# Patient Record
Sex: Female | Born: 1955
Health system: Southern US, Community
[De-identification: ages and names within clinical notes are randomized; demographics above are authoritative.]

## PROBLEM LIST (undated history)

## (undated) DIAGNOSIS — F329 Major depressive disorder, single episode, unspecified: Secondary | ICD-10-CM

## (undated) DIAGNOSIS — E785 Hyperlipidemia, unspecified: Secondary | ICD-10-CM

## (undated) DIAGNOSIS — Q5001 Congenital absence of ovary, unilateral: Secondary | ICD-10-CM

## (undated) DIAGNOSIS — F411 Generalized anxiety disorder: Secondary | ICD-10-CM

## (undated) DIAGNOSIS — N3010 Interstitial cystitis (chronic) without hematuria: Secondary | ICD-10-CM

## (undated) DIAGNOSIS — N200 Calculus of kidney: Secondary | ICD-10-CM

## (undated) DIAGNOSIS — Z9289 Personal history of other medical treatment: Secondary | ICD-10-CM

## (undated) DIAGNOSIS — K3184 Gastroparesis: Secondary | ICD-10-CM

## (undated) DIAGNOSIS — Z86010 Personal history of colonic polyps: Secondary | ICD-10-CM

## (undated) DIAGNOSIS — F32A Depression, unspecified: Secondary | ICD-10-CM

## (undated) DIAGNOSIS — H9319 Tinnitus, unspecified ear: Secondary | ICD-10-CM

## (undated) DIAGNOSIS — Z8719 Personal history of other diseases of the digestive system: Secondary | ICD-10-CM

## (undated) DIAGNOSIS — G8929 Other chronic pain: Secondary | ICD-10-CM

## (undated) DIAGNOSIS — R3989 Other symptoms and signs involving the genitourinary system: Secondary | ICD-10-CM

## (undated) DIAGNOSIS — Z8619 Personal history of other infectious and parasitic diseases: Secondary | ICD-10-CM

## (undated) DIAGNOSIS — Z860101 Personal history of adenomatous and serrated colon polyps: Secondary | ICD-10-CM

## (undated) DIAGNOSIS — M549 Dorsalgia, unspecified: Secondary | ICD-10-CM

## (undated) DIAGNOSIS — K219 Gastro-esophageal reflux disease without esophagitis: Secondary | ICD-10-CM

## (undated) HISTORY — PX: OTHER SURGICAL HISTORY: SHX169

## (undated) HISTORY — DX: Personal history of other medical treatment: Z92.89

## (undated) HISTORY — PX: CARDIOVASCULAR STRESS TEST: SHX262

## (undated) HISTORY — DX: Gastroparesis: K31.84

---

## 1990-05-11 DIAGNOSIS — N3010 Interstitial cystitis (chronic) without hematuria: Secondary | ICD-10-CM

## 1990-05-11 HISTORY — DX: Interstitial cystitis (chronic) without hematuria: N30.10

## 1996-05-11 HISTORY — PX: ABDOMINAL HYSTERECTOMY: SHX81

## 1997-09-11 ENCOUNTER — Other Ambulatory Visit: Admission: RE | Admit: 1997-09-11 | Discharge: 1997-09-11 | Payer: Self-pay | Admitting: Obstetrics and Gynecology

## 1997-10-25 ENCOUNTER — Ambulatory Visit (HOSPITAL_COMMUNITY): Admission: RE | Admit: 1997-10-25 | Discharge: 1997-10-25 | Payer: Self-pay | Admitting: Gastroenterology

## 1998-09-13 ENCOUNTER — Ambulatory Visit (HOSPITAL_COMMUNITY): Admission: RE | Admit: 1998-09-13 | Discharge: 1998-09-13 | Payer: Self-pay | Admitting: Gastroenterology

## 1999-11-12 ENCOUNTER — Emergency Department (HOSPITAL_COMMUNITY): Admission: EM | Admit: 1999-11-12 | Discharge: 1999-11-12 | Payer: Self-pay | Admitting: Emergency Medicine

## 1999-11-12 ENCOUNTER — Encounter: Payer: Self-pay | Admitting: Emergency Medicine

## 2000-07-15 ENCOUNTER — Other Ambulatory Visit: Admission: RE | Admit: 2000-07-15 | Discharge: 2000-07-15 | Payer: Self-pay | Admitting: Obstetrics and Gynecology

## 2001-01-03 ENCOUNTER — Ambulatory Visit (HOSPITAL_COMMUNITY): Admission: RE | Admit: 2001-01-03 | Discharge: 2001-01-03 | Payer: Self-pay | Admitting: Gastroenterology

## 2001-02-04 HISTORY — PX: OTHER SURGICAL HISTORY: SHX169

## 2001-04-06 ENCOUNTER — Encounter: Payer: Self-pay | Admitting: Neurosurgery

## 2001-04-06 ENCOUNTER — Ambulatory Visit (HOSPITAL_COMMUNITY): Admission: RE | Admit: 2001-04-06 | Discharge: 2001-04-06 | Payer: Self-pay | Admitting: Neurosurgery

## 2001-06-02 ENCOUNTER — Observation Stay (HOSPITAL_COMMUNITY): Admission: RE | Admit: 2001-06-02 | Discharge: 2001-06-03 | Payer: Self-pay | Admitting: Plastic Surgery

## 2001-06-02 HISTORY — PX: ABDOMINOPLASTY: SUR9

## 2002-01-12 ENCOUNTER — Emergency Department (HOSPITAL_COMMUNITY): Admission: EM | Admit: 2002-01-12 | Discharge: 2002-01-12 | Payer: Self-pay | Admitting: Emergency Medicine

## 2003-03-31 ENCOUNTER — Emergency Department (HOSPITAL_COMMUNITY): Admission: EM | Admit: 2003-03-31 | Discharge: 2003-03-31 | Payer: Self-pay | Admitting: Emergency Medicine

## 2004-03-27 ENCOUNTER — Other Ambulatory Visit: Admission: RE | Admit: 2004-03-27 | Discharge: 2004-03-27 | Payer: Self-pay | Admitting: Obstetrics and Gynecology

## 2005-05-06 ENCOUNTER — Emergency Department (HOSPITAL_COMMUNITY): Admission: EM | Admit: 2005-05-06 | Discharge: 2005-05-06 | Payer: Self-pay | Admitting: Emergency Medicine

## 2005-09-07 ENCOUNTER — Ambulatory Visit: Payer: Self-pay | Admitting: Cardiology

## 2006-05-11 HISTORY — PX: BREAST REDUCTION SURGERY: SHX8

## 2006-05-18 ENCOUNTER — Ambulatory Visit: Payer: Self-pay

## 2007-01-31 ENCOUNTER — Encounter: Admission: RE | Admit: 2007-01-31 | Discharge: 2007-01-31 | Payer: Self-pay | Admitting: Orthopedic Surgery

## 2007-02-28 ENCOUNTER — Encounter: Admission: RE | Admit: 2007-02-28 | Discharge: 2007-02-28 | Payer: Self-pay | Admitting: Orthopedic Surgery

## 2007-06-28 ENCOUNTER — Ambulatory Visit: Payer: Self-pay | Admitting: Cardiology

## 2007-07-05 ENCOUNTER — Ambulatory Visit: Payer: Self-pay

## 2007-07-05 ENCOUNTER — Encounter: Payer: Self-pay | Admitting: Internal Medicine

## 2008-03-19 ENCOUNTER — Encounter: Payer: Self-pay | Admitting: Internal Medicine

## 2008-04-13 ENCOUNTER — Emergency Department (HOSPITAL_COMMUNITY): Admission: EM | Admit: 2008-04-13 | Discharge: 2008-04-13 | Payer: Self-pay | Admitting: Emergency Medicine

## 2008-05-11 DIAGNOSIS — K3184 Gastroparesis: Secondary | ICD-10-CM

## 2008-05-11 HISTORY — DX: Gastroparesis: K31.84

## 2008-06-01 ENCOUNTER — Ambulatory Visit (HOSPITAL_COMMUNITY): Admission: RE | Admit: 2008-06-01 | Discharge: 2008-06-01 | Payer: Self-pay | Admitting: Gastroenterology

## 2008-06-26 ENCOUNTER — Encounter: Admission: RE | Admit: 2008-06-26 | Discharge: 2008-06-26 | Payer: Self-pay | Admitting: Gastroenterology

## 2008-07-03 ENCOUNTER — Ambulatory Visit (HOSPITAL_COMMUNITY): Admission: RE | Admit: 2008-07-03 | Discharge: 2008-07-03 | Payer: Self-pay | Admitting: Gastroenterology

## 2008-08-02 ENCOUNTER — Encounter: Admission: RE | Admit: 2008-08-02 | Discharge: 2008-08-02 | Payer: Self-pay | Admitting: General Surgery

## 2008-09-13 ENCOUNTER — Emergency Department (HOSPITAL_COMMUNITY): Admission: EM | Admit: 2008-09-13 | Discharge: 2008-09-14 | Payer: Self-pay | Admitting: Emergency Medicine

## 2009-06-03 ENCOUNTER — Telehealth: Payer: Self-pay | Admitting: Cardiology

## 2009-06-09 ENCOUNTER — Emergency Department (HOSPITAL_COMMUNITY): Admission: EM | Admit: 2009-06-09 | Discharge: 2009-06-09 | Payer: Self-pay | Admitting: Emergency Medicine

## 2009-06-21 DIAGNOSIS — K219 Gastro-esophageal reflux disease without esophagitis: Secondary | ICD-10-CM | POA: Insufficient documentation

## 2009-06-21 DIAGNOSIS — K3184 Gastroparesis: Secondary | ICD-10-CM | POA: Insufficient documentation

## 2009-06-24 ENCOUNTER — Ambulatory Visit: Payer: Self-pay | Admitting: Internal Medicine

## 2009-06-24 DIAGNOSIS — E785 Hyperlipidemia, unspecified: Secondary | ICD-10-CM | POA: Insufficient documentation

## 2009-06-27 ENCOUNTER — Telehealth: Payer: Self-pay | Admitting: Internal Medicine

## 2009-07-02 ENCOUNTER — Ambulatory Visit: Payer: Self-pay | Admitting: Internal Medicine

## 2009-07-03 ENCOUNTER — Encounter: Payer: Self-pay | Admitting: Internal Medicine

## 2009-07-03 LAB — CONVERTED CEMR LAB
ALT: 22 units/L (ref 0–35)
Albumin: 3.6 g/dL (ref 3.5–5.2)
Cholesterol: 302 mg/dL — ABNORMAL HIGH (ref 0–200)
Direct LDL: 214.6 mg/dL
HDL: 52.6 mg/dL (ref >39.00)
Total Bilirubin: 0.6 mg/dL (ref 0.3–1.2)

## 2009-08-01 ENCOUNTER — Telehealth: Payer: Self-pay | Admitting: Internal Medicine

## 2009-09-04 ENCOUNTER — Ambulatory Visit: Payer: Self-pay | Admitting: Internal Medicine

## 2009-09-05 ENCOUNTER — Telehealth: Payer: Self-pay | Admitting: Internal Medicine

## 2009-09-05 LAB — CONVERTED CEMR LAB
AST: 19 units/L (ref 0–37)
Cholesterol: 182 mg/dL (ref 0–200)
LDL Cholesterol: 99 mg/dL (ref 0–99)
Total CHOL/HDL Ratio: 4
Total CK: 45 units/L (ref 7–177)
Triglycerides: 193 mg/dL — ABNORMAL HIGH (ref 0.0–149.0)

## 2009-10-10 ENCOUNTER — Emergency Department (HOSPITAL_COMMUNITY): Admission: EM | Admit: 2009-10-10 | Discharge: 2009-10-10 | Payer: Self-pay | Admitting: Emergency Medicine

## 2009-11-20 ENCOUNTER — Encounter: Admission: RE | Admit: 2009-11-20 | Discharge: 2009-11-20 | Payer: Self-pay | Admitting: Otolaryngology

## 2010-03-31 ENCOUNTER — Ambulatory Visit: Payer: Self-pay | Admitting: Internal Medicine

## 2010-05-31 ENCOUNTER — Encounter: Payer: Self-pay | Admitting: Orthopedic Surgery

## 2010-06-01 ENCOUNTER — Encounter: Payer: Self-pay | Admitting: Gastroenterology

## 2010-06-01 ENCOUNTER — Encounter: Payer: Self-pay | Admitting: Orthopedic Surgery

## 2010-06-10 NOTE — Miscellaneous (Signed)
  Clinical Lists Changes  Medications: Added new medication of CRESTOR 5 MG TABS (ROSUVASTATIN CALCIUM) 1 pill every day before bedtime - Signed Rx of CRESTOR 5 MG TABS (ROSUVASTATIN CALCIUM) 1 pill every day before bedtime;  #30 x 6;  Signed;  Entered by: Layne Benton, RN, BSN;  Authorized by: Sherrill Raring, MD, [facility];  Method used: Electronically to [facility]*, [address] [place], [place]  [id number], Ph: [phone], Fax: [phone]    Prescriptions: CRESTOR 5 MG TABS (ROSUVASTATIN CALCIUM) 1 pill every day before bedtime  #30 x 6   Entered by:   Layne Benton, RN, BSN   Authorized by:   Sherrill Raring, MD, [facility]   Signed by:   Layne Benton, RN, BSN on 07/03/2009   Method used:   Electronically to        [facility]* (retail)       [address]       [place], [place]  [id number]       Ph: [phone]       Fax: [phone]   RxID:   [id number]

## 2010-06-10 NOTE — Progress Notes (Signed)
Summary: Pt is wanting a women Dr. want to talk about this matter.   Phone Note Call from Patient Call back at Home Phone [phone] Call back at Work Phone [phone]   Caller: Patient Summary of Call: Pt want to talk to nurse about switching to  Dr. Rocco Pauls pt is wanting a women Dr. Initial call taken by: Judie Grieve,  June 03, 2009 1:55 PM  Follow-up for Phone Call        Left message to call back Meredith Staggers, RN  June 04, 2009 3:13 PM   returning call, [phone], Migdalia Dk  June 04, 2009 3:50 PM    pt returning call [phone] sign. Follow-up by: Judie Grieve,  June 05, 2009 10:10 AM  Additional Follow-up for Phone Call Additional follow up Details #1::        spoke w/pt she has no problems or complaints regarding Dr Myrtis Ser, she just wants to have a female MD, will make sure ok w/MD's and we will sch her w/Dr Tenny Craw if ok Meredith Staggers, RN  June 05, 2009 11:12 AM   This is ok w/Dr Myrtis Ser, will send to [facility] to sch appt w/Dr Sandrea Hammond, RN  June 06, 2009 5:38 PM

## 2010-06-10 NOTE — Progress Notes (Signed)
Summary: returning call   Phone Note Call from Patient Call back at Work Phone [phone]   Caller: Patient Reason for Call: Talk to Nurse Summary of Call: returning call  Initial call taken by: Migdalia Dk,  September 05, 2009 9:46 AM  Follow-up for Phone Call        Called patient back with lab results and instructions. Follow-up by: Suzan Garibaldi RN

## 2010-06-10 NOTE — Assessment & Plan Note (Signed)
Summary: ec6/saw Dr Myrtis Ser pts/jss  Medications Added PROZAC 20 MG CAPS (FLUOXETINE HCL) 1  by mouth daily AMBIEN 10 MG TABS (ZOLPIDEM TARTRATE) 1 by mouth as needed * DOMPERIDONE 10MG  1 by mouth 4 times daily      Allergies Added: ! ASA ! NSAIDS  History of Present Illness: Olivia Barker is a 55 year old with a history of dyslipidemia and chest pain.  She was previously seen by Lovena Neighbours in clinic in 2009. At that time underwent an exercise cardiolite that was normal.  She has been on therapy in past for dyslipidemia.  Last was Crestor. Since seen, she was also seen at [facility])  She has not f/u with any cardiologis since. She remains very active.  Works out aerobically every day.  No problems She has been seen by Dr. Kinnie Scales and at [facility].  Found to have gastroparesis.  Has had marked improvement in symptoms with domperidone.  Chest pressue is gone. Watches what she eats.  Allergies (verified): 1)  ! Asa 2)  ! Nsaids  Past History:  Past Surgical History: Last updated: 06/21/2009  breast reduction, caesarean section, hysterectomy   Social History: Last updated: 06/21/2009  non-smoker, non-drinker   Past Medical History:  gastroparesis, GERD  Dyslipidemia  Family History: Family Hx of heart disease`on fathers side(dad died of MI as did his brothers) 2 brothers with DM, mom with DM  Review of Systems       All systems reviewed.  Negatvie to the above problem except as noted above.  Vital Signs:  Patient profile:   55 year old female Height:      63 inches Weight:      165 pounds BMI:     29.33 Pulse rate:   87 / minute Resp:     16 per minute BP sitting:   112 / 86  (left arm)  Vitals Entered By: Marrion Coy, CNA (June 24, 2009 10:27 AM)  Physical Exam  Additional Exam:  HEENT:  Normocephalic, atraumatic. EOMI, PERRLA.  Neck: JVP is normal. No thyromegaly. No bruits.  Lungs: clear to auscultation. No rales no wheezes.  Heart: Regular rate and  rhythm. Normal S1, S2. No S3.   No significant murmurs. PMI not displaced.  Abdomen:  Supple, nontender. Normal bowel sounds. No masses. No hepatomegaly.  Extremities:   Good distal pulses throughout. No lower extremity edema.  Musculoskeletal :moving all extremities.  Neuro:   alert and oriented x3.    EKG  Procedure date:  06/24/2009  Findings:      NSR.  87 bpm.  T wave inversion III, AVF (new in AVF)  Impression & Recommendations:  Problem # 1:  HYPERLIPIDEMIA-MIXED (ICD-272.4) I have copies of previous lipids and other markers.  I think we need current tipids to see where to go. I am not convinced signif CRP elev in past was due to CAD risks. I would encourage her to continue to stay active.  Problem # 2:  GASTROPARESIS (ICD-536.3) Followed by J. Medoff and at [facility].  Other Orders: EKG w/ Interpretation (93000)  Patient Instructions: 1)  Will get labs and contact.  Appended Document: ec6/saw Dr Myrtis Ser pts/jss Noted did not tolerate pravachol.  Felt foggy on Lipitor.  Appended Document: ec6/saw Dr Myrtis Ser pts/jss LM on cell phone to call back to schedule Fasting Lab work.

## 2010-06-10 NOTE — Progress Notes (Signed)
Summary: Questions about medications/**LM/nm   Phone Note Call from Patient Call back at Home Phone [phone]   Caller: Patient Summary of Call: Questions about medications Initial call taken by: Judie Grieve,  August 01, 2009 1:10 PM  Follow-up for Phone Call        [facility]. Ollen Gross, RN, BSN  August 01, 2009 2:30 PM    Additional Follow-up for Phone Call Additional follow up Details #1::        Called patient back again...she reported that she is feeling a little achy in the thighs maybe due to taking Crestor 5mg  every day. She will see if she can tolerate it longer. Advised her that if condition worsens that Dr.Shawnta Schlegel will either cut back on the dose or change to a different medication. She is scheduled for repeat fasting labs in April.  Additional Follow-up by: J REISS RN      Appended Document: Questions about medications/**LM/nm Make sure she is set up for CK as well.  Appended Document: Questions about medications/**LM/nm Added CK to lab order for April.

## 2010-06-10 NOTE — Progress Notes (Signed)
Summary: returning call   Phone Note Call from Patient Call back at [phone]   Reason for Call: Talk to Nurse Summary of Call: returning call Initial call taken by: Migdalia Dk,  June 27, 2009 10:22 AM  Follow-up for Phone Call        I spoke with the pt. She is scheduled for 07/02/09 for lipid/liver panel.  Follow-up by: Sherri Rad, RN, BSN,  June 27, 2009 10:30 AM

## 2010-06-13 ENCOUNTER — Ambulatory Visit: Payer: Self-pay | Admitting: Internal Medicine

## 2010-06-13 ENCOUNTER — Ambulatory Visit: Admit: 2010-06-13 | Payer: Self-pay | Admitting: Internal Medicine

## 2010-07-04 ENCOUNTER — Encounter: Payer: Self-pay | Admitting: Internal Medicine

## 2010-07-04 ENCOUNTER — Ambulatory Visit (INDEPENDENT_AMBULATORY_CARE_PROVIDER_SITE_OTHER): Payer: BC Managed Care – PPO | Admitting: Internal Medicine

## 2010-07-04 DIAGNOSIS — R0789 Other chest pain: Secondary | ICD-10-CM

## 2010-07-04 DIAGNOSIS — E7800 Pure hypercholesterolemia, unspecified: Secondary | ICD-10-CM

## 2010-07-04 DIAGNOSIS — E785 Hyperlipidemia, unspecified: Secondary | ICD-10-CM

## 2010-07-07 ENCOUNTER — Telehealth: Payer: Self-pay | Admitting: Internal Medicine

## 2010-07-08 LAB — CONVERTED CEMR LAB
BUN: 12 mg/dL (ref 6–23)
CO2: 25 meq/L (ref 19–32)
Calcium: 9.4 mg/dL (ref 8.4–10.5)
Hgb A1c MFr Bld: 5.7 % — ABNORMAL HIGH (ref <5.7)
Sodium: 139 meq/L (ref 135–145)

## 2010-07-16 ENCOUNTER — Telehealth: Payer: Self-pay | Admitting: Internal Medicine

## 2010-07-17 NOTE — Progress Notes (Addendum)
Summary: lab results   Phone Note Call from Patient   Caller: Patient [phone] Reason for Call: Talk to Nurse, Lab or Test Results Summary of Call: pt calling re lab results Initial call taken by: Glynda Jaeger,  July 07, 2010 2:08 PM  Follow-up for Phone Call        Called and left message on private cell phone. Bmet and Hgbaic within normal limits. Still awating Liopomed.   Layne Benton, RN, BSN  July 07, 2010 5:21 PM

## 2010-07-17 NOTE — Assessment & Plan Note (Signed)
Summary: per check out/SF;F1Y  Medications Added ZOFRAN 8 MG TABS (ONDANSETRON HCL) as needed LORAZEPAM 1 MG TABS (LORAZEPAM) as needed      Allergies Added:   Visit Type:  Follow-up  CC:  sob / sick - headaches.  History of Present Illness: Ms. Gilmore is a 55 year old with a history of dyslipidemia and chest pain.  She was previously seen by Lovena Neighbours in clinic in 2009. At that time underwent an exercise cardiolite that was normal.  She has been on therapy in past for dyslipidemia.  Last was Crestor. Since seen, she was also seen at [place] [facility])  She has not f/u with any cardiologis since. She remains very active.  Works out aerobically every day.  No problems She has been seen by Dr. Kinnie Scales and at [facility].  Found to have gastroparesis.  Has had marked improvement in symptoms with domperidone.  Chest pressue is gone. Watches what she eats. since I saw her 1 year ago she has done well.  Denies chest pains  Breathing is OK  No chest pain.  No problems swallowing.  Does have a URI now.  Seen by E. McKinnon.   Current Medications (verified): 1)  Prozac 20 Mg Caps (Fluoxetine Hcl) .Marland Kitchen.. 1  By Mouth Daily 2)  Ambien 10 Mg Tabs (Zolpidem Tartrate) .Marland Kitchen.. 1 By Mouth As Needed 3)  Domperidone 10mg  .... 1 By Mouth 4 Times Daily 4)  Crestor 5 Mg Tabs (Rosuvastatin Calcium) .Marland Kitchen.. 1 Pill Every Day Before Bedtime 5)  Zofran 8 Mg Tabs (Ondansetron Hcl) .... As Needed 6)  Lorazepam 1 Mg Tabs (Lorazepam) .... As Needed  Allergies (verified): 1)  ! Asa 2)  ! Nsaids  Past History:  Past medical, surgical, family and social histories (including risk factors) reviewed, and no changes noted (except as noted below).  Past Medical History: Reviewed history from 06/24/2009 and no changes required.  gastroparesis, GERD  Dyslipidemia  Past Surgical History: Reviewed history from 06/21/2009 and no changes required.  breast reduction, caesarean section, hysterectomy   Family History: Reviewed  history from 06/24/2009 and no changes required. Family Hx of heart disease`on fathers side(dad died of MI as did his brothers) 2 brothers with DM, mom with DM  Social History: Reviewed history from 06/21/2009 and no changes required.  non-smoker, non-drinker   Review of Systems       All systems reviewed.  neg to the above problme except as noted above.  Vital Signs:  Patient profile:   55 year old female Height:      63 inches Weight:      167.75 pounds BMI:     29.82 Pulse rate:   93 / minute BP sitting:   134 / 90  (left arm) Cuff size:   regular  Vitals Entered By: Caralee Ates CMA (July 04, 2010 12:28 PM)  Physical Exam  Additional Exam:  Patient is in NAD HEENT:  Normocephalic, atraumatic. EOMI, PERRLA.  Neck: JVP is normal. No thyromegaly. No bruits.  Lungs: clear to auscultation. No rales no wheezes.  Heart: Regular rate and rhythm. Normal S1, S2. No S3.   No significant murmurs. PMI not displaced.  Abdomen:  Supple, nontender. Normal bowel sounds. No masses. No hepatomegaly.  Extremities:   Good distal pulses throughout. No lower extremity edema.  Musculoskeletal :moving all extremities.  Neuro:   alert and oriented x3.    EKG  Procedure date:  07/04/2010  Findings:      NSR.  85 bpm.  Impression & Recommendations:  Problem # 1:  HYPERLIPIDEMIA-MIXED (ICD-272.4) Will check lipomed as well as Hgb A1c and glu today.  keep on same regimen. Her updated medication list for this problem includes:    Crestor 5 Mg Tabs (Rosuvastatin calcium) .Marland Kitchen... 1 pill every day before bedtime  Orders: EKG w/ Interpretation (93000) T-Basic Metabolic Panel ([address]454) T-Hgb A1C (in-house) (09811BJ) T- * Misc. Laboratory test [phone])  Problem # 2:  GASTROPARESIS (ICD-536.3) Keep on same regimen.  Doing well.  Patient Instructions: 1)  Your physician recommends that you return for lab work in: lab work today .Marland KitchenMarland KitchenMarland Kitchenwe will call you with results 2)  Your physician  wants you to follow-up in: 12 months  You will receive a reminder letter in the mail two months in advance. If you don't receive a letter, please call our office to schedule the follow-up appointment.

## 2010-07-22 NOTE — Progress Notes (Signed)
Summary: pt call to get lab results   Phone Note Call from Patient Call back at [phone]   Caller: Patient Reason for Call: Talk to Nurse, Talk to Doctor, Lab or Test Results Summary of Call: Pt calling to get lipomed results because she still has not heard anything yet Initial call taken by: Omer Jack,  July 16, 2010 9:38 AM  Follow-up for Phone Call        Spoke with Annice Pih, Dr. Charlott Rakes RN, & she will f/u on this. Whitney Maeola Sarah RN  July 16, 2010 10:54 AM  Follow-up by: Whitney Maeola Sarah RN,  July 16, 2010 10:54 AM     Appended Document: pt call to get lab results Called rep from [facility] and they will fax results.

## 2010-07-25 ENCOUNTER — Telehealth: Payer: Self-pay | Admitting: Internal Medicine

## 2010-07-27 LAB — COMPREHENSIVE METABOLIC PANEL
ALT: 22 U/L (ref 0–35)
AST: 17 U/L (ref 0–37)
Albumin: 3.6 g/dL (ref 3.5–5.2)
BUN: 7 mg/dL (ref 6–23)
CO2: 27 mEq/L (ref 19–32)
Calcium: 8.7 mg/dL (ref 8.4–10.5)
Creatinine, Ser: 0.72 mg/dL (ref 0.4–1.2)
GFR calc non Af Amer: >60 mL/min (ref >60)

## 2010-07-27 LAB — URINALYSIS, ROUTINE W REFLEX MICROSCOPIC
Ketones, ur: NEGATIVE mg/dL
Nitrite: NEGATIVE
Specific Gravity, Urine: 1.004 — ABNORMAL LOW (ref 1.005–1.030)
Urobilinogen, UA: 0.2 mg/dL (ref 0.0–1.0)

## 2010-07-27 LAB — CBC
HCT: 41.5 % (ref 36.0–46.0)
RDW: 12.4 % (ref 11.5–15.5)
WBC: 10.1 10*3/uL (ref 4.0–10.5)

## 2010-07-27 LAB — DIFFERENTIAL
Basophils Relative: 0 % (ref 0–1)
Eosinophils Absolute: 0.1 10*3/uL (ref 0.0–0.7)
Eosinophils Relative: 1 % (ref 0–5)
Lymphs Abs: 1.8 10*3/uL (ref 0.7–4.0)
Monocytes Absolute: 0.5 10*3/uL (ref 0.1–1.0)
Neutrophils Relative %: 76 % (ref 43–77)

## 2010-07-27 LAB — LIPASE, BLOOD: Lipase: 30 U/L (ref 11–59)

## 2010-07-27 LAB — URINE MICROSCOPIC-ADD ON

## 2010-07-28 LAB — DIFFERENTIAL
Basophils Absolute: 0 10*3/uL (ref 0.0–0.1)
Lymphs Abs: 2.1 10*3/uL (ref 0.7–4.0)
Monocytes Absolute: 0.6 10*3/uL (ref 0.1–1.0)

## 2010-07-28 LAB — COMPREHENSIVE METABOLIC PANEL
ALT: 40 U/L — ABNORMAL HIGH (ref 0–35)
AST: 29 U/L (ref 0–37)
Alkaline Phosphatase: 72 U/L (ref 39–117)
BUN: 15 mg/dL (ref 6–23)
Calcium: 9.6 mg/dL (ref 8.4–10.5)
GFR calc Af Amer: >60 mL/min (ref >60)
GFR calc non Af Amer: >60 mL/min (ref >60)
Glucose, Bld: 131 mg/dL — ABNORMAL HIGH (ref 70–99)
Sodium: 138 mEq/L (ref 135–145)

## 2010-07-28 LAB — CBC
HCT: 47.6 % — ABNORMAL HIGH (ref 36.0–46.0)
MCHC: 33.4 g/dL (ref 30.0–36.0)
MCV: 92.3 fL (ref 78.0–100.0)
Platelets: 287 10*3/uL (ref 150–400)
RDW: 12.9 % (ref 11.5–15.5)
WBC: 16.5 10*3/uL — ABNORMAL HIGH (ref 4.0–10.5)

## 2010-07-29 NOTE — Progress Notes (Signed)
Summary: pt wants cholesterol results  Medications Added NIASPAN 500 MG CR-TABS (NIACIN (ANTIHYPERLIPIDEMIC)) 1 tab every day       Phone Note Call from Patient   Caller: Patient [phone]  Reason for Call: Talk to Nurse Summary of Call: pt wanting chlolesterol results Initial call taken by: Glynda Jaeger,  July 25, 2010 1:27 PM  Follow-up for Phone Call        Called patient with Lipomed results. She will start Niaspan 500mg  every day and have repeat labs on 5/30. She will pick up coupon and script. Copy of lipomed left for her to pick up also. Layne Benton, RN, BSN  July 25, 2010 2:29 PM     New/Updated Medications: NIASPAN 500 MG CR-TABS (NIACIN (ANTIHYPERLIPIDEMIC)) 1 tab every day Prescriptions: NIASPAN 500 MG CR-TABS (NIACIN (ANTIHYPERLIPIDEMIC)) 1 tab every day  #30 x 11   Entered by:   Layne Benton, RN, BSN   Authorized by:   Sherrill Raring, MD, [facility]   Signed by:   Layne Benton, RN, BSN on 07/25/2010   Method used:   Print then Give to Patient   RxID:   [id number]

## 2010-08-12 ENCOUNTER — Telehealth: Payer: Self-pay | Admitting: Internal Medicine

## 2010-08-12 NOTE — Telephone Encounter (Signed)
Called patient back-c/o flushing and indigestion with Niacin.  Wants to know if there is something else she can take.

## 2010-08-12 NOTE — Telephone Encounter (Signed)
Pt has question re meds. °

## 2010-08-13 NOTE — Telephone Encounter (Signed)
Called patient and advised her to stay off Niaspan since she had a bad reaction to it. Advised her will discuss with Dr.Ross on 4/5 if she wants her to take any other medication in addition to the Crestor that she is currently taking.    Layne Benton RN.

## 2010-08-13 NOTE — Telephone Encounter (Signed)
Pt calling stating she had bad reaction to niacin(flushing,nausea,skin bright red) and called [place] 2days ago to talk someone about it and no one has called her back--pt stated she d/ced the med and took benadryl, and it has subsided but she's very upset no one has gotten back to her and she wants to know what to do next--advised i would have dr Tenny Craw or [place] call her today or tomorrow--nt

## 2010-08-19 LAB — CBC
MCHC: 34.5 g/dL (ref 30.0–36.0)
RBC: 4.36 MIL/uL (ref 3.87–5.11)
RDW: 13.2 % (ref 11.5–15.5)

## 2010-08-19 LAB — DIFFERENTIAL
Basophils Absolute: 0 10*3/uL (ref 0.0–0.1)
Basophils Relative: 0 % (ref 0–1)
Monocytes Relative: 3 % (ref 3–12)
Neutro Abs: 9.9 10*3/uL — ABNORMAL HIGH (ref 1.7–7.7)
Neutrophils Relative %: 84 % — ABNORMAL HIGH (ref 43–77)

## 2010-08-19 LAB — COMPREHENSIVE METABOLIC PANEL
AST: 24 U/L (ref 0–37)
CO2: 27 mEq/L (ref 19–32)
Chloride: 105 mEq/L (ref 96–112)
Creatinine, Ser: 0.71 mg/dL (ref 0.4–1.2)
GFR calc Af Amer: >60 mL/min (ref >60)
GFR calc non Af Amer: >60 mL/min (ref >60)
Glucose, Bld: 106 mg/dL — ABNORMAL HIGH (ref 70–99)
Total Bilirubin: 1 mg/dL (ref 0.3–1.2)

## 2010-08-25 ENCOUNTER — Telehealth: Payer: Self-pay | Admitting: Internal Medicine

## 2010-08-25 NOTE — Telephone Encounter (Signed)
Pt calling to speak to dr Tenny Craw concerning her reaction to niacin--would like to know if possibly could cut niacin tab inhalf and resume taking or should she try something else--she would like dr Tenny Craw to call her on her cell phone today--[phone]--nt

## 2010-08-25 NOTE — Telephone Encounter (Signed)
rtn call to [facility] re results-pls call

## 2010-08-26 NOTE — Telephone Encounter (Signed)
Patient called.  Can't take ASA  (allergic).  Will try Niaspan with snack. Will review with pharmacy any other options.

## 2010-08-27 NOTE — Telephone Encounter (Signed)
Reviewed pt's information.  On statin for primary prevention.  LDL-P is elevated.  If pt cannot tolerate Niacin, can increase statin to help lower LDL-P, just usually not as effective as niacin.  Would ensure pt is using extended release niacin to help decrease flushing.

## 2010-08-29 NOTE — Telephone Encounter (Signed)
Called patient again. She did not re  try the Niaspan  because she has been out of town. She will try medication with a snack and call us next week to let us know how she is feeling.

## 2010-09-03 ENCOUNTER — Telehealth: Payer: Self-pay | Admitting: Internal Medicine

## 2010-09-03 DIAGNOSIS — E782 Mixed hyperlipidemia: Secondary | ICD-10-CM

## 2010-09-03 MED ORDER — ROSUVASTATIN CALCIUM 10 MG PO TABS: 10.0000 mg | ORAL_TABLET | Freq: Every day | ORAL | Status: DC

## 2010-09-03 NOTE — Telephone Encounter (Signed)
Pt calling back re meds being change.

## 2010-09-03 NOTE — Telephone Encounter (Addendum)
Called patient back. She did not tolerate Niaspan even with taking with a snack so she will stop medication and increase Crestor to 10mg  per day per Dr.Ross. She will have a repeat Lipomed and ast on June 20th at the [place] office. She will call back is she has any other problems.

## 2010-09-23 NOTE — Assessment & Plan Note (Signed)
[facility]                            CARDIOLOGY OFFICE NOTE   NAME:Fulop, PEREL HAUSCHILD                        MRN:          [id number]  DATE:06/28/2007                            DOB:          May 25, 1955    Ms. Riedinger is doing well overall.  She has had some chest discomfort in  the past few weeks.  She has had an area of discomfort at the upper  sternum that seemed to be more noticeable at night.  This did not bother  her with exercise.  There was no shortness of breath.  There was no  nausea, vomiting or diaphoresis.  She continued full activities.  There  was no pain to palpation.  This improved and it is now gone.   The patient has a family history of coronary disease.  She has  hyperlipidemia and this is being treated.   PAST MEDICAL HISTORY:   ALLERGIES:  THERE IS A HISTORY TO INDOCIN AND IVP DYE.  WITH DISCUSSION  TODAY, SHE TELLS ME THAT SHE HAD SWELLING IN THE PAST FROM NONSTEROIDAL  ANTI-INFLAMMATORY MED.  SHE SPOKE WITH DR. Stevphen Rochester OF ALLERGY AND  WAS TOLD NOT TO USE AND STATES AND IN ADDITION NOT TO USE ASPIRIN.  I DO  NOT KNOW THAT WE HAVE A DOCUMENTED TRUE ASPIRIN ALLERGY AND THIS WILL  HAVE TO BE KEPT IN MIND OVER TIME.   MEDICATIONS:  1. Prozac 20.  2. Crestor 5.   OTHER MEDICAL PROBLEMS:  See the list below.   REVIEW OF SYSTEMS:  She is not having any headaches or eye problems.  She has no GI or GU symptoms.  She is going about full activities.  Her  review of systems otherwise is negative.   PHYSICAL EXAM:  Weight is 155 pounds.  Blood pressure is 131/94. Her  other blood pressures with Dr. Chilton Si have been in the normal range.  She  needs blood pressure followup.  Rate is 89.  The patient is oriented to  person, time and place.  Affect is normal.  HEENT:  Reveals no xanthelasma.  She has normal extraocular motion.  There are no carotid bruits.  There is no jugular venous distention.  Lungs are clear.  Respiratory  effort is not labored.  Cardiac exam reveals S1-S2.  There are no clicks or significant murmurs.  The abdomen is soft.  She has normal bowel sounds.  She has no peripheral edema.  There are no musculoskeletal deformities.   EKG is normal.   PROBLEMS INCLUDE:  1. Strong family history of coronary disease.  2. Possible episode of supraventricular tachycardia in the past that      resolved.  3. History in the past of an elevated CRP.  She she is being treated      with Crestor for overall primary prevention.  4.  Some chest      heaviness in the past and some chest heaviness recently.  I am not      convinced this was cardiac.  She had an exercise test 4 years ago  and I feel that it should be repeated at this time.  4. Episodes of some palpitations over time, but this has not been a      problem recently.  5. SIGNIFICANT HISTORY OF ALLERGY TO INDOCIN (NSAIDS).  THERE IS ALSO      AN IVP DYE ALLERGY AND QUESTION OF AN ASPIRIN ALLERGY.   The patient has not had a chest x-ray in a prolonged period of time.  Because of some chest discomfort, we will obtain a chest x-ray and a  stress Myoview scan.  We will be in touch with her with the information.     Luis Abed, MD, [facility]  Electronically Signed    JDK/MedQ  DD: 06/28/2007  DT: 06/28/2007  Job #: [id number]   cc:   Erskine Speed, M.D.

## 2010-09-26 NOTE — Op Note (Signed)
[facility]  Patient:    Olivia Barker, Olivia Barker Visit Number: [id number] MRN: [id number]          Service Type: DSU Location: 9300 [phone] Attending Physician:  Consuello Bossier Dictated by:   Consuello Bossier., M.D. Proc. Date: 06/02/01 Admit Date:  06/02/2001                             Operative Report  PREOPERATIVE DIAGNOSIS:  Lower abdominal elastosis with overhanging skin.  POSTOPERATIVE DIAGNOSIS:  Lower abdominal elastosis with overhanging skin.  OPERATION:  Limited abdominoplasty.  SURGEON:  Consuello Bossier., M.D.  ANESTHESIA:  Spinal with sedation.  FINDINGS:  The patient had a previous lower abdominal Pfannenstiel incision and quite a bit of excessive lower abdominal skin for which a limited abdominoplasty was performed.  PROCEDURE:  The operating room, having been marked for the planned lower abdominal incision to be made, excising the previous Pfannenstiel incision and continuing out to the iliac crest area.  She had been given a spinal anesthetic, prepped with HibiClens, and draped sterilely.  The incision was made and the dissection continued down through the subcutaneous and fatty tissue until the rectus and external oblique fascias were encountered. Dissection was then continued up at the level of the fascia proximally to the umbilicus.  Bleeding was controlled with electrocautery.  There was noted to be good hemostasis.  This skin was then able to be brought down and an incision made in the midline and up to the area which had been previously marked off where interrupted #2-0 Vicryl were used in the midline without any undue tightness.  At this point, two Blake drains were placed laterally and brought out through the lateral aspect of the wound.  The large triangular segments of the lower abdominal full thickness tissue were then removed. Again, bleeding was controlled with electrocautery.  The wound was irrigated with  normal saline.  There was noted to be good hemostasis.  The lower abdominal transverse wound was then closed first with interrupted subcutaneous, #3-0 Monocryl, followed by a running subcuticular 4-0 Monocryl. Similar amounts of tissue were removed in terms of weight from both sides. Steri-Strips, Xeroform, 4 x 8s, and light compressive dressings were then applied.  The patient tolerated the procedure well and was able to be discharged from the operating room to the recovery room and subsequently to be admitted for over-the-night observation. Dictated by:   Consuello Bossier., M.D. Attending Physician:  Consuello Bossier DD:  06/02/01 TD:  06/03/01 Job: [id number]

## 2010-10-08 ENCOUNTER — Other Ambulatory Visit: Payer: BC Managed Care – PPO

## 2010-10-29 ENCOUNTER — Other Ambulatory Visit: Payer: BC Managed Care – PPO

## 2010-10-29 ENCOUNTER — Other Ambulatory Visit: Payer: Self-pay | Admitting: Internal Medicine

## 2010-12-16 ENCOUNTER — Telehealth: Payer: Self-pay | Admitting: Internal Medicine

## 2010-12-16 DIAGNOSIS — E7800 Pure hypercholesterolemia, unspecified: Secondary | ICD-10-CM

## 2010-12-16 NOTE — Telephone Encounter (Signed)
Called patient. She missed her fasting lab work appointment in June. Will schedule Lipomed and ast for tomorrow.

## 2010-12-16 NOTE — Telephone Encounter (Signed)
Pt need an order for lab work  

## 2010-12-17 ENCOUNTER — Other Ambulatory Visit (INDEPENDENT_AMBULATORY_CARE_PROVIDER_SITE_OTHER): Payer: BC Managed Care – PPO | Admitting: *Deleted

## 2010-12-17 DIAGNOSIS — E7800 Pure hypercholesterolemia, unspecified: Secondary | ICD-10-CM

## 2010-12-19 LAB — NMR LIPOPROFILE WITH LIPIDS
LDL (calc): 104 mg/dL — ABNORMAL HIGH (ref <100)
LDL Particle Number: 1820 nmol/L — ABNORMAL HIGH (ref <1000)
LP-IR Score: 70 — ABNORMAL HIGH (ref <=45)

## 2011-01-13 NOTE — Progress Notes (Signed)
Will schedule both in March 2013.

## 2011-01-13 NOTE — Progress Notes (Signed)
Called patient and LM on private cell phone that she needs fasting labs and ROV with Dr.Ross in March of 2013. Will place in call backs for MD appointment and patient will call back to schedule labs next year.

## 2011-01-27 ENCOUNTER — Other Ambulatory Visit: Payer: Self-pay | Admitting: Orthopedic Surgery

## 2011-01-27 DIAGNOSIS — M79671 Pain in right foot: Secondary | ICD-10-CM

## 2011-01-28 ENCOUNTER — Ambulatory Visit
Admission: RE | Admit: 2011-01-28 | Discharge: 2011-01-28 | Disposition: A | Discharge status: Home / Self Care | Payer: BC Managed Care – PPO | Source: Ambulatory Visit | Attending: Orthopedic Surgery | Admitting: Orthopedic Surgery

## 2011-01-28 ENCOUNTER — Other Ambulatory Visit: Payer: BC Managed Care – PPO

## 2011-01-28 ENCOUNTER — Inpatient Hospital Stay: Admission: RE | Admit: 2011-01-28 | Payer: BC Managed Care – PPO | Source: Ambulatory Visit

## 2011-01-28 DIAGNOSIS — M79671 Pain in right foot: Secondary | ICD-10-CM

## 2011-02-05 ENCOUNTER — Ambulatory Visit (HOSPITAL_BASED_OUTPATIENT_CLINIC_OR_DEPARTMENT_OTHER)
Admission: RE | Admit: 2011-02-05 | Discharge: 2011-02-05 | Disposition: A | Discharge status: Home / Self Care | Payer: BC Managed Care – PPO | Source: Ambulatory Visit | Attending: Orthopedic Surgery | Admitting: Orthopedic Surgery

## 2011-02-05 DIAGNOSIS — Y929 Unspecified place or not applicable: Secondary | ICD-10-CM | POA: Insufficient documentation

## 2011-02-05 DIAGNOSIS — X500XXA Overexertion from strenuous movement or load, initial encounter: Secondary | ICD-10-CM | POA: Insufficient documentation

## 2011-02-05 DIAGNOSIS — S92309A Fracture of unspecified metatarsal bone(s), unspecified foot, initial encounter for closed fracture: Secondary | ICD-10-CM | POA: Insufficient documentation

## 2011-02-05 LAB — POCT HEMOGLOBIN-HEMACUE: Hemoglobin: 13.2 g/dL (ref 12.0–15.0)

## 2011-02-13 LAB — DIFFERENTIAL
Basophils Relative: 1 % (ref 0–1)
Eosinophils Absolute: 0.1 10*3/uL (ref 0.0–0.7)
Lymphs Abs: 2.3 10*3/uL (ref 0.7–4.0)
Monocytes Absolute: 0.6 10*3/uL (ref 0.1–1.0)
Monocytes Relative: 6 % (ref 3–12)
Neutro Abs: 6.6 10*3/uL (ref 1.7–7.7)

## 2011-02-13 LAB — COMPREHENSIVE METABOLIC PANEL
ALT: 34 U/L (ref 0–35)
Albumin: 3.9 g/dL (ref 3.5–5.2)
Alkaline Phosphatase: 81 U/L (ref 39–117)
Potassium: 3.6 mEq/L (ref 3.5–5.1)
Sodium: 138 mEq/L (ref 135–145)
Total Protein: 7.2 g/dL (ref 6.0–8.3)

## 2011-02-13 LAB — CBC
Hemoglobin: 15 g/dL (ref 12.0–15.0)
RBC: 4.82 MIL/uL (ref 3.87–5.11)
WBC: 9.6 10*3/uL (ref 4.0–10.5)

## 2011-02-13 LAB — CK TOTAL AND CKMB (NOT AT ARMC)
CK, MB: 0.9 ng/mL (ref 0.3–4.0)
CK, MB: 1 ng/mL (ref 0.3–4.0)
Relative Index: INVALID (ref 0.0–2.5)
Total CK: 34 U/L (ref 7–177)

## 2011-02-13 LAB — TROPONIN I: Troponin I: <0.01 ng/mL (ref 0.00–0.06)

## 2011-02-17 NOTE — Op Note (Signed)
  Olivia Barker, Olivia Barker                 ACCOUNT NO.:  [id number]  MEDICAL RECORD NO.:  [id number]  LOCATION:                                 FACILITY:  PHYSICIAN:  Elana Alm. Thurston Hole, M.D.      DATE OF BIRTH:  DATE OF PROCEDURE:  02/05/2011 DATE OF DISCHARGE:                              OPERATIVE REPORT   PREOPERATIVE DIAGNOSIS:  Right foot fifth metatarsal fracture.  POSTOPERATIVE DIAGNOSIS:  Right foot fifth metatarsal fracture.  PROCEDURE:  Open reduction and internal fixation of right foot fifth metatarsal fracture.  SURGEON:  Elana Alm. Thurston Hole, MD  ASSISTANT:  Julien Girt, PA-C  ANESTHESIA:  General.  OPERATIVE TIME:  45 minutes.  COMPLICATIONS:  None.  INDICATIONS FOR PROCEDURE:  [name] is a 55 year old who sustained a displaced proximal fifth metatarsal base fracture with a twisting injury on a wet surface approximately 2 weeks ago.  X-rays, CT scan, and exam have revealed a significant displacement of this fracture and because of this she is now to undergo ORIF of this.  DESCRIPTION OF PROCEDURE:  [name] was brought to operating room on February 05, 2011 after an ankle block was placed in the holding by Anesthesia.  She was placed on the operative table in supine position. She was placed under MAC anesthesia.  Her right foot leg was prepped using sterile DuraPrep and draped using sterile technique.  The foot and leg was exsanguinated and her calf tourniquet elevated to 350 mm. Initially through a 3-4 cm longitudinal incision based over the base of the fifth metatarsal, mesh exposure was made.  Underlying subcutaneous tissues were incised along with the skin incision.  The lateral nerves were carefully protected while the fracture was exposed.  The fracture was reduced manually and confirmed with fluoroscopy and then a guide pin from the cannulated 3.0 mm headless screw set was drilled from the proximal aspect of the base of the fifth metatarsal  obliquely into the medial aspect of the metatarsal shaft across the fracture site.  It was measured for length and then overdrilled proximally with a 2.0 mm drill and then a cannulated 26 x 3.0 headless compression screw was screwed over the guide pin into the distal metatarsal shaft over the guide pin with excellent firm and tight fixation.  Intraoperative fluoroscopy confirmed anatomic reduction of the fracture and satisfactory position of the hardware.  At this point, the wound was irrigated and closed with 3-0 Vicryl and 4-0 nylon.  Sterile dressings and a short-leg splint applied.  Tourniquet was released and the patient was awakened and taken to recovery in stable condition.  Needle and sponge counts correct x2 at the end of case.  FOLLOWUP CARE:  [name] is to be followed as an outpatient, on Stadol for pain, and Robaxin.  She can make an office visit in a weak or may come back to the office in a week for wound check and followup     [name] A. Thurston Hole, M.D.     RAW/MEDQ  D:  02/05/2011  T:  02/05/2011  Job:  [id number]  Electronically Signed by Salvatore Marvel M.D. on 02/17/2011 07:45:03 AM

## 2011-03-06 ENCOUNTER — Other Ambulatory Visit: Payer: Self-pay | Admitting: Internal Medicine

## 2011-03-06 DIAGNOSIS — E782 Mixed hyperlipidemia: Secondary | ICD-10-CM

## 2011-03-06 MED ORDER — ROSUVASTATIN CALCIUM 10 MG PO TABS: 10.0000 mg | ORAL_TABLET | Freq: Every day | ORAL | Status: DC

## 2011-05-03 ENCOUNTER — Observation Stay (HOSPITAL_COMMUNITY)
Admission: AD | Admit: 2011-05-03 | Discharge: 2011-05-04 | Disposition: A | Discharge status: Home / Self Care | Payer: 59 | Source: Ambulatory Visit | Attending: Obstetrics and Gynecology | Admitting: Obstetrics and Gynecology

## 2011-05-03 ENCOUNTER — Encounter (HOSPITAL_COMMUNITY): Payer: Self-pay | Admitting: *Deleted

## 2011-05-03 DIAGNOSIS — R109 Unspecified abdominal pain: Secondary | ICD-10-CM | POA: Insufficient documentation

## 2011-05-03 DIAGNOSIS — K3184 Gastroparesis: Principal | ICD-10-CM | POA: Insufficient documentation

## 2011-05-03 HISTORY — DX: Hyperlipidemia, unspecified: E78.5

## 2011-05-03 HISTORY — DX: Interstitial cystitis (chronic) without hematuria: N30.10

## 2011-05-03 LAB — CBC
HCT: 42.7 % (ref 36.0–46.0)
RBC: 4.74 MIL/uL (ref 3.87–5.11)
RDW: 12.5 % (ref 11.5–15.5)
WBC: 7.8 10*3/uL (ref 4.0–10.5)

## 2011-05-03 LAB — URINALYSIS, ROUTINE W REFLEX MICROSCOPIC
Bilirubin Urine: NEGATIVE
Glucose, UA: NEGATIVE mg/dL
Hgb urine dipstick: NEGATIVE
Ketones, ur: NEGATIVE mg/dL
Leukocytes, UA: NEGATIVE
Nitrite: NEGATIVE
Protein, ur: NEGATIVE mg/dL
Specific Gravity, Urine: 1.01 (ref 1.005–1.030)
Urobilinogen, UA: 0.2 mg/dL (ref 0.0–1.0)
pH: 7 (ref 5.0–8.0)

## 2011-05-03 LAB — HEPATIC FUNCTION PANEL
ALT: 24 U/L (ref 0–35)
AST: 15 U/L (ref 0–37)
Albumin: 3.3 g/dL — ABNORMAL LOW (ref 3.5–5.2)
Alkaline Phosphatase: 72 U/L (ref 39–117)
Bilirubin, Direct: 0.1 mg/dL (ref 0.0–0.3)
Indirect Bilirubin: 0.4 mg/dL (ref 0.3–0.9)
Total Bilirubin: 0.5 mg/dL (ref 0.3–1.2)
Total Protein: 6.5 g/dL (ref 6.0–8.3)

## 2011-05-03 MED ORDER — DIAZEPAM 5 MG/ML IJ SOLN
2.5000 mg | Freq: Once | INTRAMUSCULAR | Status: AC
  Administered 2011-05-03: 2.5 mg via INTRAVENOUS

## 2011-05-03 MED ORDER — HYDROMORPHONE 0.3 MG/ML IV SOLN: INTRAVENOUS | Status: DC

## 2011-05-03 MED ORDER — ZOLPIDEM TARTRATE 10 MG PO TABS
10.0000 mg | ORAL_TABLET | Freq: Every evening | ORAL | Status: DC | PRN
  Administered 2011-05-03: 10 mg via ORAL
  Filled 2011-05-03: qty 1

## 2011-05-03 MED ORDER — HYOSCYAMINE SULFATE 0.125 MG SL SUBL
0.1250 mg | SUBLINGUAL_TABLET | SUBLINGUAL | Status: DC | PRN
  Filled 2011-05-03: qty 1

## 2011-05-03 MED ORDER — PROMETHAZINE HCL 25 MG/ML IJ SOLN
25.0000 mg | Freq: Four times a day (QID) | INTRAMUSCULAR | Status: DC | PRN
  Administered 2011-05-04: 25 mg via INTRAVENOUS
  Filled 2011-05-03: qty 1

## 2011-05-03 MED ORDER — PROMETHAZINE HCL 25 MG/ML IJ SOLN
12.5000 mg | Freq: Once | INTRAMUSCULAR | Status: AC
  Administered 2011-05-03: 12.5 mg via INTRAVENOUS
  Filled 2011-05-03: qty 1

## 2011-05-03 MED ORDER — DIAZEPAM 5 MG/ML IJ SOLN: 2.5000 mg | Freq: Once | INTRAMUSCULAR | Status: DC

## 2011-05-03 MED ORDER — KCL-LACTATED RINGERS 20 MEQ/L IV SOLN
INTRAVENOUS | Status: DC
  Administered 2011-05-03 – 2011-05-04 (×2): via INTRAVENOUS
  Filled 2011-05-03 (×4): qty 1000

## 2011-05-03 MED ORDER — LACTATED RINGERS IV SOLN
INTRAVENOUS | Status: DC
  Administered 2011-05-03: 600 mL via INTRAVENOUS

## 2011-05-03 MED ORDER — CEFTRIAXONE SODIUM 1 G IJ SOLR
1.0000 g | INTRAMUSCULAR | Status: DC
  Administered 2011-05-03: 1 g via INTRAVENOUS
  Filled 2011-05-03: qty 10

## 2011-05-03 MED ORDER — NALOXONE HCL 0.4 MG/ML IJ SOLN: 0.4000 mg | INTRAMUSCULAR | Status: DC | PRN

## 2011-05-03 MED ORDER — FAMOTIDINE 20 MG PO TABS: 20.0000 mg | ORAL_TABLET | Freq: Every day | ORAL | Status: DC

## 2011-05-03 MED ORDER — ONDANSETRON HCL 4 MG/2ML IJ SOLN
4.0000 mg | Freq: Once | INTRAMUSCULAR | Status: AC
  Administered 2011-05-03: 4 mg via INTRAVENOUS
  Filled 2011-05-03: qty 2

## 2011-05-03 MED ORDER — ONDANSETRON 8 MG PO TBDP
8.0000 mg | ORAL_TABLET | Freq: Three times a day (TID) | ORAL | Status: DC | PRN
  Filled 2011-05-03: qty 1

## 2011-05-03 MED ORDER — BUTORPHANOL TARTRATE 2 MG/ML IJ SOLN
1.0000 mg | INTRAMUSCULAR | Status: DC | PRN
  Administered 2011-05-03 – 2011-05-04 (×3): 1 mg via INTRAVENOUS
  Filled 2011-05-03 (×4): qty 1

## 2011-05-03 MED ORDER — SODIUM CHLORIDE 0.9 % IJ SOLN: 9.0000 mL | INTRAMUSCULAR | Status: DC | PRN

## 2011-05-03 MED ORDER — DIPHENHYDRAMINE HCL 50 MG/ML IJ SOLN: 12.5000 mg | Freq: Four times a day (QID) | INTRAMUSCULAR | Status: DC | PRN

## 2011-05-03 MED ORDER — ACETAMINOPHEN 325 MG PO TABS
650.0000 mg | ORAL_TABLET | Freq: Four times a day (QID) | ORAL | Status: DC | PRN
  Administered 2011-05-04 (×2): 650 mg via ORAL
  Filled 2011-05-03 (×2): qty 2

## 2011-05-03 MED ORDER — DIPHENHYDRAMINE HCL 12.5 MG/5ML PO ELIX: 12.5000 mg | ORAL_SOLUTION | Freq: Four times a day (QID) | ORAL | Status: DC | PRN

## 2011-05-03 MED ORDER — LACTATED RINGERS IV BOLUS (SEPSIS)
400.0000 mL | Freq: Once | INTRAVENOUS | Status: AC
  Administered 2011-05-03: 400 mL via INTRAVENOUS

## 2011-05-03 MED ORDER — SODIUM CHLORIDE 0.9 % IJ SOLN
INTRAMUSCULAR | Status: AC
  Filled 2011-05-03: qty 3

## 2011-05-03 MED ORDER — HYOSCYAMINE SULFATE 0.125 MG SL SUBL
0.1250 mg | SUBLINGUAL_TABLET | Freq: Once | SUBLINGUAL | Status: AC
  Administered 2011-05-03: 0.125 mg via SUBLINGUAL
  Filled 2011-05-03: qty 1

## 2011-05-03 NOTE — Progress Notes (Signed)
Notified Dr. Tenny Craw of elevated temp 102.2

## 2011-05-03 NOTE — Progress Notes (Signed)
Pt requested more phenergan. Still having nausea. Notified pt too earlier to give anymore phenergan. Asked her if she wanted something else for nauesa ptstated she did not she would just wait to she if she would start to feel better.

## 2011-05-03 NOTE — Progress Notes (Signed)
Pt still nauseated. Pt willing to try some zofran for nausea. Notified Dr. Tenny Craw obtaied order for zofran

## 2011-05-03 NOTE — ED Provider Notes (Signed)
55 year old white female followed by Dr. Daphene Calamity for gastroparesis that has been long standing nad episodic. Leads to episode of abdominal pain, bloating, nausea and vomiting. Usually responds to IV fluids, phenergan and IV diazapam. Can take 5 to 6 hours to resolve. Now being assessed in triage for a recurrent episode. Has also been assessed by GI department at [facility]. She has been a gyn patient of mine for several years and I am aware of these episode. Told to come to [facility] hospital to see if this can be managed as in past with antiemetics and IV fluids.  V/S Stable Afberile  Chest clear. Heart regular rhythm. Abdomen is slightly distended and there is no rebound or guarding note.  Impression: recurrent gastroparesis.  Plan> IV Phenergan           IV Diazapam           IV fluids.  Check CBC LFTs and amylase.

## 2011-05-03 NOTE — Progress Notes (Signed)
Notified Dr. Linus Orn of pt request for zofran and lab results given to him.

## 2011-05-03 NOTE — Progress Notes (Signed)
Pt having severe abd pain. Pt has gastric statis and is having and "attack" pt sent by MD for IV fluid and medications.

## 2011-05-03 NOTE — ED Notes (Signed)
Dr. Tenny Craw AT [facility]EDSDIE TO EVAL PT. 2ND DOSE OF VALUIM ORDERED.

## 2011-05-03 NOTE — H&P (Signed)
55 year old white female G2 P2002 status post hysterectomy with known history of gastroparesis follow by Dr. Vida Rigger and GI department at [facility] presents with onset of nausea  and abdominal pain similar to what she has had on several occassions in the past. Many of these episodes have been treated on an outpatient basis and have responded to IV phenergan, IV fluids and antispasmotic agents. Additionally she has interstial cystitis and had been seen by her urologist this week and had bladder instillations on two occassions this week. She was treated in triage in an effort to bring her symptoms under control but after several hours still remained ill with persistent nausea, minimal vomiting, but no diarrhea. She does report diffuse abdominal pain and back pain. In triage the latest vital signs revealed a temperature of 102. She is now being admitted to continue to treat the suspected gastroparesis but with the fever and recent urologic manipulation she is also suspected of have a urinary tract infection and possible pyelonephritis.  Allergies: NSAIDS, aspirin, lohexal. Intolerance to morphine with increased nausea.  Meds: Prozac, marinol, rosuvastatin  ROS: Respiratory, no cough,no SOB, no hemoptasis GI: positive for nausea, vomiting, abdominal pain, no diarrhea, no melena, no hematochezia GU: Positve frequency, positive urgency, no hematuria. Gyn: No bleeding, no discharge no itching, no pelvic pain.  Physical exam:    Temp 102   BP 130/80 range, Pulse 92  Respirations 18   Well developed well nourished white female in moderate abdominal distress Head is normocephalic and atruamatic Eyes: Perrla, non icteric, EOM's have full range of motions      Heart; regular rhythm no murrmur or gallop  Breasts not examined. Abdomen is soft, slightly tender, BS are present, there is mild distension but no rebound or guarding. No masses are felt. Pelvic: was deferred. Extremities; warm with full range  of motion. Neurologic exam is intact.  Impression:   1. Gastroparesis                        2. Fever after urologic manipulation will R/O UTI, and pyelonephritis  Plan: IV hydration with LR and KCl          Phenergan 25 mg Q 4 to 6 hours          Levsin SL 0.125 mgs           Stadol 1 mg Q4 hours PRN pain          Ceftriazone1 gram IV to cover urinary tract including possible pyeleonephritis          Cimetidine IV   Will repeat CBC in AM  Check urine culture    Reassess in Am if not doing better will get her GI MD to consult and manage this non gyn problem.

## 2011-05-04 LAB — URINE CULTURE
Colony Count: NO GROWTH
Culture  Setup Time: 201212232258

## 2011-05-04 LAB — CBC
HCT: 38.2 % (ref 36.0–46.0)
MCHC: 32.2 g/dL (ref 30.0–36.0)
MCV: 91.2 fL (ref 78.0–100.0)
RDW: 12.8 % (ref 11.5–15.5)

## 2011-05-04 NOTE — Progress Notes (Signed)
UR chart review completed.  

## 2011-05-04 NOTE — Plan of Care (Signed)
Problem: Phase I Progression Outcomes Goal: Pain controlled with appropriate interventions Outcome: Completed/Met Date Met:  05/04/11 Good pain control with IV Stadol Goal: OOB as tolerated unless otherwise ordered Outcome: Completed/Met Date Met:  05/04/11 Walks to BR independently and tolerates well Goal: Initial discharge plan identified Outcome: Progressing VSS,afebrile Good pain control Relief of N&V Goal: Voiding-avoid urinary catheter unless indicated Outcome: Completed/Met Date Met:  05/04/11 Voiding adequate amts of clear yellow urine Goal: Hemodynamically stable Outcome: Progressing Remains Afebrile at this point Goal: Other Phase I Outcomes/Goals Outcome: Progressing Find and treat source of fever,  Problem: Phase II Progression Outcomes Goal: Discharge plan established Outcome: Progressing Pain relief Nausea relief Afebrile

## 2011-05-04 NOTE — Progress Notes (Signed)
S: Feeling better this AM: Most of the nausea has resolved there has been no vomiting. Back pain is also getting better  O: Afebrile V/S are stable      Abdomen is soft non tender less distended. No rebound or guarding.       WBC 4.4       Hgb Stable       Urine culture is pending  A: Improved episode of gastroparesis       UTI culture pending.  Plan:  Discharge home            Resume prior home meds            Set up follow up visit with Dr. Ewing Schlein            Ceftin 250 BID for 5 days             To call if any problems such as fever worsening pain or nausea and vomiting

## 2011-05-07 ENCOUNTER — Other Ambulatory Visit: Payer: 59

## 2011-05-07 ENCOUNTER — Other Ambulatory Visit: Payer: Self-pay | Admitting: Gastroenterology

## 2011-05-07 DIAGNOSIS — R112 Nausea with vomiting, unspecified: Secondary | ICD-10-CM

## 2011-05-07 DIAGNOSIS — R1084 Generalized abdominal pain: Secondary | ICD-10-CM

## 2011-05-07 DIAGNOSIS — K3184 Gastroparesis: Secondary | ICD-10-CM

## 2011-05-08 ENCOUNTER — Other Ambulatory Visit: Payer: 59

## 2011-05-11 NOTE — Discharge Summary (Signed)
Olivia Barker, ADDIS NO.:  [id number]  MEDICAL RECORD NO.:  [id number]  LOCATION:  9319                          FACILITY:  [facility]  PHYSICIAN:  Miguel Aschoff, M.D.       DATE OF BIRTH:  Oct 07, 1955  DATE OF ADMISSION:  05/03/2011 DATE OF DISCHARGE:  05/04/2011                              DISCHARGE SUMMARY   ADMISSION DIAGNOSIS:  Gastroparesis, abdominal pain, possible urinary tract infection.  FINAL DIAGNOSIS:  Gastroparesis.  BRIEF HISTORY:  The patient is a 55 year old, white female with a known diagnosis of gastroparesis followed by Dr. Vida Rigger for gastric issues as well as the Gastroenterology Department at [facility]. The patient has been on a variety of medications to control her symptomatology associated with gastroparesis, but her abdominal symptoms progressively became worse on May 03, 2011, to the point where she was having nausea and vomiting and unable to keep down liquids as well as severe low abdominal pain.  Because of this, she was brought to the triage area of the [facility], started on the routine that usually controls the symptoms for her which was intravenous Phenergan, IV fluids and analgesics.  The patient was observed in the emergency room for approximately 4-5 hours with little improvement of her symptoms, and because of this, was admitted to the Med Surg Unit to undergo further treatment for her problem.  HOSPITAL COURSE:  Laboratory studies were obtained.  This revealed an admission hemoglobin of 12.3, white count was 4600.  Chemistry profile was essentially within normal limits.  Comprehensive metabolic panel was essentially unremarkable.  The patient was made n.p.o. except for clear liquids.  She was continued on intravenous Phenergan 25 mg IV q.4 hours as needed for symptoms.  She was started on Stadol medication for pain 1 mg every 3-4 hours as needed and the IV fluids continued.  By the afternoon of the  24th, the patient was feeling improved.  There was no further vomiting.  The nausea was under control and she felt that she was well enough to be discharged home.  Medications for home included Phenergan 25 mg every 4-6 hours as needed for nausea.  She was told to resume all her prior medications that she was prescribed by Dr. Ewing Schlein and to call his office should there be any other gastrointestinal issues.  She was sent home in satisfactory condition.  Final diagnosis was that of abdominal pain, nausea and vomiting secondary to gastroparesis.     Miguel Aschoff, M.D.     AR/MEDQ  D:  05/10/2011  T:  05/11/2011  Job:  [id number]

## 2011-06-08 ENCOUNTER — Inpatient Hospital Stay
Admission: RE | Admit: 2011-06-08 | Discharge: 2011-06-08 | Payer: 59 | Source: Ambulatory Visit | Attending: Gastroenterology | Admitting: Gastroenterology

## 2011-06-30 ENCOUNTER — Other Ambulatory Visit: Payer: 59

## 2011-07-10 ENCOUNTER — Ambulatory Visit (INDEPENDENT_AMBULATORY_CARE_PROVIDER_SITE_OTHER): Payer: 59 | Admitting: Internal Medicine

## 2011-07-10 ENCOUNTER — Encounter: Payer: Self-pay | Admitting: Internal Medicine

## 2011-07-10 VITALS — BP 135/89 | HR 86 | Ht 63.0 in | Wt 171.0 lb

## 2011-07-10 DIAGNOSIS — E785 Hyperlipidemia, unspecified: Secondary | ICD-10-CM

## 2011-07-10 DIAGNOSIS — E782 Mixed hyperlipidemia: Secondary | ICD-10-CM

## 2011-07-10 DIAGNOSIS — K3184 Gastroparesis: Secondary | ICD-10-CM

## 2011-07-10 LAB — AST: AST: 24 U/L (ref 0–37)

## 2011-07-10 NOTE — Progress Notes (Signed)
HPI Patient is a 56 year old with a history of dyslipidemia, CP, esophageal dysmotility.  I last saw her in Feb of last year Since seen she has done ok from a cardiac standpoint.  She broke her foot in the fall, requiring surgery.  She is just now get back to exercising vigorously.  She still hs problems with esop dysmotility.  No severe flares though. Allergies  Allergen Reactions  . Aspirin   . Iohexol      Desc: IVP DYE/NSAIDS   . Nsaids     Current Outpatient Prescriptions  Medication Sig Dispense Refill  . dronabinol (MARINOL) 2.5 MG capsule Take 2.5 mg by mouth 2 (two) times daily before a meal.        . FLUoxetine (PROZAC) 10 MG tablet TAKE 3 TABS DAILY      . ondansetron (ZOFRAN) 4 MG tablet Take 8 mg by mouth every 8 (eight) hours as needed. For nausea      . promethazine (PHENERGAN) 25 MG tablet Take 25 mg by mouth every 6 (six) hours as needed. nausea       . rosuvastatin (CRESTOR) 10 MG tablet Take 1 tablet (10 mg total) by mouth at bedtime.  30 tablet  3  . zolpidem (AMBIEN) 10 MG tablet Take 10 mg by mouth at bedtime as needed. sleep         Past Medical History  Diagnosis Date  . Interstitial cystitis 1992  . Hyperlipidemia     Past Surgical History  Procedure Date  . Abdominal hysterectomy 1998  . Fracture surgery 01/2011    R) foot    No family history on file.  History   Social History  . Marital Status: Married    Spouse Name: N/A    Number of Children: N/A  . Years of Education: N/A   Occupational History  . Not on file.   Social History Main Topics  . Smoking status: Never Smoker   . Smokeless tobacco: Not on file  . Alcohol Use: No  . Drug Use: No  . Sexually Active: Yes   Other Topics Concern  . Not on file   Social History Narrative  . No narrative on file    Review of Systems:  All systems reviewed.  They are negative to the above problem except as previously stated.  Vital Signs: BP 135/89  Pulse 86  Ht 5\' 3"  (1.6 m)  Wt  171 lb (77.565 kg)  BMI 30.29 kg/m2  Physical Exam Patient is in NAD HEENT:  Normocephalic, atraumatic. EOMI, PERRLA.  Neck: JVP is normal. No thyromegaly. No bruits.  Lungs: clear to auscultation. No rales no wheezes.  Heart: Regular rate and rhythm. Normal S1, S2. No S3.   No significant murmurs. PMI not displaced.  Abdomen:  Supple, nontender. Normal bowel sounds. No masses. No hepatomegaly.  Extremities:   Good distal pulses throughout. No lower extremity edema.  Musculoskeletal :moving all extremities.  Neuro:   alert and oriented x3.  CN II-XII grossly intact.   Assessment and Plan:

## 2011-07-10 NOTE — Assessment & Plan Note (Signed)
Will check fasting lipids.  Had small glass of sweet tea.

## 2011-07-10 NOTE — Assessment & Plan Note (Signed)
Followed by Dr Kinnie Scales and at [facility].

## 2011-07-10 NOTE — Patient Instructions (Signed)
Your physician wants you to follow-up in:  12 months.  You will receive a reminder letter in the mail two months in advance. If you don't receive a letter, please call our office to schedule the follow-up appointment.   

## 2011-07-13 LAB — NMR LIPOPROFILE WITH LIPIDS
HDL Particle Number: 27.5 umol/L — ABNORMAL LOW (ref >=30.5)
HDL-C: 42 mg/dL (ref >=40)
LP-IR Score: 58 — ABNORMAL HIGH (ref <=45)
Small LDL Particle Number: 1039 nmol/L — ABNORMAL HIGH (ref <=527)

## 2011-08-20 ENCOUNTER — Telehealth: Payer: Self-pay | Admitting: Internal Medicine

## 2011-08-20 NOTE — Telephone Encounter (Signed)
Called and left message that Dr.Ross needs to review LIPOMED from March and then will call back again.

## 2011-08-20 NOTE — Telephone Encounter (Signed)
New msg Pt wants get lab results

## 2011-08-27 ENCOUNTER — Telehealth: Payer: Self-pay | Admitting: Internal Medicine

## 2011-08-27 DIAGNOSIS — E782 Mixed hyperlipidemia: Secondary | ICD-10-CM

## 2011-08-27 MED ORDER — ROSUVASTATIN CALCIUM 20 MG PO TABS: 20.0000 mg | ORAL_TABLET | Freq: Every day | ORAL | Status: DC

## 2011-08-27 NOTE — Telephone Encounter (Signed)
Pt would like results of her lab work today if possible

## 2011-08-27 NOTE — Telephone Encounter (Signed)
partilce number is still high with small particles.  Did not tolerate Niaspan which would help Try Crestor 20   Check lipomed in 8 wks. With AST

## 2011-08-27 NOTE — Telephone Encounter (Signed)
Called patient with lipomed results. She will increase Crestor to 20 mg every day and repeat labs on 6/14.

## 2011-10-06 ENCOUNTER — Ambulatory Visit (HOSPITAL_COMMUNITY)
Admission: RE | Admit: 2011-10-06 | Discharge: 2011-10-06 | Disposition: A | Discharge status: Home / Self Care | Payer: 59 | Source: Ambulatory Visit | Attending: Internal Medicine | Admitting: Internal Medicine

## 2011-10-06 DIAGNOSIS — M79606 Pain in leg, unspecified: Secondary | ICD-10-CM

## 2011-10-06 DIAGNOSIS — M79609 Pain in unspecified limb: Secondary | ICD-10-CM

## 2011-10-06 NOTE — Progress Notes (Addendum)
VASCULAR LAB PRELIMINARY  PRELIMINARY  PRELIMINARY  PRELIMINARY  Right lower extremity venous duplex completed.  Right leg is negative for DVT.  Preliminary report: Olivia Barker, 10/06/2011, 5:58 PM

## 2011-10-23 ENCOUNTER — Other Ambulatory Visit: Payer: 59

## 2011-12-21 ENCOUNTER — Other Ambulatory Visit: Payer: Self-pay | Admitting: Radiology

## 2012-02-10 ENCOUNTER — Other Ambulatory Visit: Payer: Self-pay | Admitting: Gastroenterology

## 2012-02-10 DIAGNOSIS — K3184 Gastroparesis: Secondary | ICD-10-CM

## 2012-02-10 DIAGNOSIS — R1084 Generalized abdominal pain: Secondary | ICD-10-CM

## 2012-02-10 DIAGNOSIS — R112 Nausea with vomiting, unspecified: Secondary | ICD-10-CM

## 2012-02-16 ENCOUNTER — Ambulatory Visit (INDEPENDENT_AMBULATORY_CARE_PROVIDER_SITE_OTHER): Payer: PRIVATE HEALTH INSURANCE | Admitting: Nurse Practitioner

## 2012-02-16 ENCOUNTER — Encounter: Payer: Self-pay | Admitting: Nurse Practitioner

## 2012-02-16 ENCOUNTER — Other Ambulatory Visit: Payer: 59

## 2012-02-16 VITALS — BP 118/82 | HR 96 | Ht 65.0 in | Wt 162.4 lb

## 2012-02-16 DIAGNOSIS — R55 Syncope and collapse: Secondary | ICD-10-CM

## 2012-02-16 DIAGNOSIS — E785 Hyperlipidemia, unspecified: Secondary | ICD-10-CM

## 2012-02-16 LAB — CBC WITH DIFFERENTIAL/PLATELET
Basophils Absolute: 0 10*3/uL (ref 0.0–0.1)
Basophils Relative: 0.3 % (ref 0.0–3.0)
Eosinophils Absolute: 0.1 10*3/uL (ref 0.0–0.7)
Eosinophils Relative: 1 % (ref 0.0–5.0)
HCT: 41.9 % (ref 36.0–46.0)
Hemoglobin: 13.6 g/dL (ref 12.0–15.0)
Lymphocytes Relative: 23.2 % (ref 12.0–46.0)
Lymphs Abs: 2.1 10*3/uL (ref 0.7–4.0)
MCHC: 32.5 g/dL (ref 30.0–36.0)
MCV: 89 fl (ref 78.0–100.0)
Monocytes Absolute: 0.5 10*3/uL (ref 0.1–1.0)
Monocytes Relative: 5.7 % (ref 3.0–12.0)
Neutro Abs: 6.2 10*3/uL (ref 1.4–7.7)
Neutrophils Relative %: 69.8 % (ref 43.0–77.0)
Platelets: 298 10*3/uL (ref 150.0–400.0)
RBC: 4.71 Mil/uL (ref 3.87–5.11)
RDW: 13.3 % (ref 11.5–14.6)
WBC: 8.9 10*3/uL (ref 4.5–10.5)

## 2012-02-16 LAB — BASIC METABOLIC PANEL
BUN: 15 mg/dL (ref 6–23)
CO2: 29 mEq/L (ref 19–32)
Calcium: 8.8 mg/dL (ref 8.4–10.5)
Chloride: 101 mEq/L (ref 96–112)
Creatinine, Ser: 0.8 mg/dL (ref 0.4–1.2)
GFR: 81.4 mL/min (ref >60.00)
Glucose, Bld: 109 mg/dL — ABNORMAL HIGH (ref 70–99)
Potassium: 3 mEq/L — ABNORMAL LOW (ref 3.5–5.1)
Sodium: 137 mEq/L (ref 135–145)

## 2012-02-16 LAB — CK TOTAL AND CKMB (NOT AT ARMC)
CK, MB: 1.5 ng/mL (ref 0.3–4.0)
Relative Index: 1.2 (ref 0.0–2.5)
Total CK: 129 U/L (ref 7–177)

## 2012-02-16 LAB — TROPONIN I: Troponin I: <0.3 ng/mL (ref <0.30)

## 2012-02-16 NOTE — Patient Instructions (Addendum)
We are going to arrange for a stress test Eugenie Birks)  We are going to get labs today.  We will see you back in March unless your study is abnormal.  If you have recurrent symptoms, we will need to do further testing  Call the [facility] office at [phone] if you have any questions, problems or concerns.

## 2012-02-16 NOTE — Progress Notes (Signed)
Olivia Barker Date of Birth: 08/05/1955 Medical Record #[id number]  History of Present Illness: Olivia Barker is seen today for a work in visit. She is seen for Dr. Tenny Craw. She has a history of gastroparesis, HLD and esophageal dysmotility. She has no known CAD. She reports remote stress testing. She had a broken foot last fall and has suffered recent sprain/reinjury.   She comes in today. She is here alone. She called to make an appointment due to a "fainting" spell over the past weekend. She has had a recent stomach "bug" with associated diarrhea. This was in addition to her usual GI issues that she has with her gastroparesis. Her oral intake was decreased. 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 She took a little tumble at the end of the routine and reinjured her foot. She went home was having lots of pain. Her husband gave her 2 hydrocodone tablets. She was on the sofa and asleep around 10:30 am. At about 2 am she found her self on the bathroom floor. She woke up and was staring at the ceiling. She had no clue how she had gotten there. She went downstairs. Checked her blood pressure and it was 80/60. She called the on call MD who felt like she had had dehydration/orthostasis. She got her foot checked the next day and was told she needed to follow up here. She is quite concerned due to her cardiac history. Father had first MI at 30. She has had no chest pain. Energy level has been ok. She does have HLD. She had been exercising but is now limited by her foot. She has had a prior MRA of her head/neck showing no carotid disease.   Current Outpatient Prescriptions on File Prior to Visit  Medication Sig Dispense Refill  . dronabinol (MARINOL) 2.5 MG capsule Take 2.5 mg by mouth 2 (two) times daily before a meal.        . FLUoxetine (PROZAC) 10 MG tablet TAKE 3 TABS DAILY      . ondansetron (ZOFRAN) 4 MG tablet Take 8 mg by mouth every 8 (eight) hours as needed. For  nausea      . promethazine (PHENERGAN) 25 MG tablet Take 25 mg by mouth every 6 (six) hours as needed. nausea       . rosuvastatin (CRESTOR) 20 MG tablet Take 1 tablet (20 mg total) by mouth at bedtime.  30 tablet  6  . triamterene-hydrochlorothiazide (MAXZIDE) 75-50 MG per tablet 1 tablet as needed. For tinnitus      . zolpidem (AMBIEN) 10 MG tablet Take 10 mg by mouth at bedtime as needed. sleep       . topiramate (TOPAMAX) 25 MG tablet Take 25 mg by mouth daily.         Allergies  Allergen Reactions  . Aspirin   . Iohexol      Desc: IVP DYE/NSAIDS   . Nsaids     Past Medical History  Diagnosis Date  . Interstitial cystitis 1992  . Hyperlipidemia   . Gastroparesis     Past Surgical History  Procedure Date  . Abdominal hysterectomy 1998  . Fracture surgery 01/2011    R) foot    History  Smoking status  . Never Smoker   Smokeless tobacco  . Not on file    History  Alcohol Use No    Family History  Problem Relation Age of Onset  . Heart disease Father   .  Heart attack Father     Review of Systems: The review of systems is per the HPI.  All other systems were reviewed and are negative.  Physical Exam: BP 118/82  Pulse 96  Ht 5\' 5"  (1.651 m)  Wt 162 lb 6.4 oz (73.664 kg)  BMI 27.02 kg/m2 Patient is very pleasant and in no acute distress. Skin is warm and dry. Color is normal.  HEENT is unremarkable. Normocephalic/atraumatic. PERRL. Sclera are nonicteric. Neck is supple. No masses. No JVD. Lungs are clear. Cardiac exam shows a regular rate and rhythm. Abdomen is soft. Extremities are without edema. Gait and ROM are intact. No gross neurologic deficits noted.   LABORATORY DATA: EKG today shows sinus rhythm and is normal.   Lab Results  Component Value Date   WBC 4.6 05/04/2011   HGB 12.3 05/04/2011   HCT 38.2 05/04/2011   PLT 216 05/04/2011   GLUCOSE 87 07/04/2010   CHOL 182 09/04/2009   TRIG 240* 07/10/2011   HDL 44.80 09/04/2009   LDLDIRECT 214.6  07/02/2009   LDLCALC 63 07/10/2011   ALT 24 05/03/2011   AST 24 07/10/2011   NA 139 07/04/2010   K 4.1 07/04/2010   CL 102 07/04/2010   CREATININE 0.74 07/04/2010   BUN 12 07/04/2010   CO2 25 07/04/2010   HGBA1C 5.7* 07/04/2010    MRA HEAD July 2011  Findings: Both internal carotid arteries are widely patent into the brain. The anterior and middle cerebral vessels are patent without proximal stenosis, aneurysm or vascular malformation. Both vertebral arteries are widely patent to the basilar. No basilar stenosis. Posterior circulation branch vessels appear normal.  IMPRESSION: Normal intracranial MR angiography of the large and medium-sized vessels.  Assessment / Plan:  1. ? Syncopal episode - this sounds more like she was dehydrated and then had too much hydrocodone, which she does not normally take. She wants to have stress testing. She is now limited in her ability to exercise due to her foot injury. Will arrange for lexiscan. If she has recurrent spells, she will need further evaluation. We will tentatively see her back at her regular visit in March unless her study is abnormal.   2. Positive family history of CAD  3. HLD - on statin therapy.  Patient is agreeable to this plan and will call if any problems develop in the interim.

## 2012-02-17 ENCOUNTER — Telehealth: Payer: Self-pay | Admitting: Internal Medicine

## 2012-02-17 DIAGNOSIS — E876 Hypokalemia: Secondary | ICD-10-CM

## 2012-02-17 MED ORDER — POTASSIUM CHLORIDE CRYS ER 20 MEQ PO TBCR: EXTENDED_RELEASE_TABLET | ORAL | Status: DC

## 2012-02-17 NOTE — Telephone Encounter (Signed)
plz return call to patient at hm#.   Pt received call around 5pm detailing test results, nurse told her potassium was low.  plz call pt back to advise.

## 2012-02-17 NOTE — Telephone Encounter (Signed)
Pt aware of lab work and MD's recommendations.

## 2012-02-23 ENCOUNTER — Other Ambulatory Visit: Payer: 59

## 2012-02-24 ENCOUNTER — Other Ambulatory Visit (INDEPENDENT_AMBULATORY_CARE_PROVIDER_SITE_OTHER): Payer: PRIVATE HEALTH INSURANCE

## 2012-02-24 DIAGNOSIS — E876 Hypokalemia: Secondary | ICD-10-CM

## 2012-02-24 LAB — BASIC METABOLIC PANEL
BUN: 15 mg/dL (ref 6–23)
Calcium: 9 mg/dL (ref 8.4–10.5)
Creatinine, Ser: 0.9 mg/dL (ref 0.4–1.2)
GFR: 69.9 mL/min (ref >60.00)
Potassium: 3.7 mEq/L (ref 3.5–5.1)

## 2012-02-25 ENCOUNTER — Ambulatory Visit (HOSPITAL_COMMUNITY): Payer: PRIVATE HEALTH INSURANCE | Attending: Cardiovascular Disease | Admitting: Radiology

## 2012-02-25 VITALS — BP 98/78 | HR 86 | Ht 65.0 in | Wt 160.0 lb

## 2012-02-25 DIAGNOSIS — Z8249 Family history of ischemic heart disease and other diseases of the circulatory system: Secondary | ICD-10-CM | POA: Insufficient documentation

## 2012-02-25 DIAGNOSIS — R000 Tachycardia, unspecified: Secondary | ICD-10-CM | POA: Insufficient documentation

## 2012-02-25 DIAGNOSIS — R5383 Other fatigue: Secondary | ICD-10-CM | POA: Insufficient documentation

## 2012-02-25 DIAGNOSIS — E785 Hyperlipidemia, unspecified: Secondary | ICD-10-CM

## 2012-02-25 DIAGNOSIS — R55 Syncope and collapse: Secondary | ICD-10-CM

## 2012-02-25 DIAGNOSIS — R5381 Other malaise: Secondary | ICD-10-CM | POA: Insufficient documentation

## 2012-02-25 DIAGNOSIS — R110 Nausea: Secondary | ICD-10-CM

## 2012-02-25 HISTORY — PX: CARDIOVASCULAR STRESS TEST: SHX262

## 2012-02-25 MED ORDER — REGADENOSON 0.4 MG/5ML IV SOLN
0.4000 mg | Freq: Once | INTRAVENOUS | Status: AC
  Administered 2012-02-25: 0.4 mg via INTRAVENOUS

## 2012-02-25 MED ORDER — TECHNETIUM TC 99M SESTAMIBI GENERIC - CARDIOLITE
33.0000 | Freq: Once | INTRAVENOUS | Status: AC | PRN
  Administered 2012-02-25: 33 via INTRAVENOUS

## 2012-02-25 MED ORDER — TECHNETIUM TC 99M SESTAMIBI GENERIC - CARDIOLITE
10.0000 | Freq: Once | INTRAVENOUS | Status: AC | PRN
  Administered 2012-02-25: 10 via INTRAVENOUS

## 2012-02-25 MED ORDER — AMINOPHYLLINE 25 MG/ML IV SOLN
75.0000 mg | Freq: Once | INTRAVENOUS | Status: AC
  Administered 2012-02-25: 75 mg via INTRAVENOUS

## 2012-02-25 NOTE — Progress Notes (Signed)
[facility] SITE 3 NUCLEAR MED [address] [id number] [place] [address] [phone]  Cardiology Nuclear Med Study  Olivia Barker is a 56 y.o. female     MRN : [id number]     DOB: [birth year]  Procedure Date: 02/25/2012  Nuclear Med Background Indication for Stress Test:  Evaluation for Ischemia History:  '09 GEX:BMWUXL, EF=65% Cardiac Risk Factors: Family History - CAD and Lipids  Symptoms:  Fatigue, Rapid HR and Syncope   Nuclear Pre-Procedure Caffeine/Decaff Intake:  None NPO After: 7:30pm   Lungs:  Clear. O2 Sat: 95% on room air. IV 0.9% NS with Angio Cath:  22g  IV Site: L Antecubital  IV Started by:  Bonnita Levan, RN  Chest Size (in):  36 Cup Size: D  Height: 5\' 5"  (1.651 m)  Weight:  160 lb (72.576 kg)  BMI:  Body mass index is 26.63 kg/(m^2). Tech Comments:  N/A    Nuclear Med Study 1 or 2 day study: 1 day  Stress Test Type:  Lexiscan  Reading MD: Charlton Haws, MD  Order Authorizing Provider:  Dietrich Pates, MD  Resting Radionuclide: Technetium 33m Sestamibi  Resting Radionuclide Dose: 10.4 mCi   Stress Radionuclide:  Technetium 15m Sestamibi  Stress Radionuclide Dose: 32.7 mCi           Stress Protocol Rest HR: 86 Stress HR: 122  Rest BP: 98/78 Stress BP: 116/76  Exercise Time (min): n/a METS: n/a   Predicted Max HR: 165 bpm % Max HR: 73.94 bpm Rate Pressure Product: 24401   Dose of Adenosine (mg):  n/a Dose of Lexiscan: 0.4 mg  Dose of Atropine (mg): n/a Dose of Dobutamine: n/a mcg/kg/min (at max HR)  Stress Test Technologist: Smiley Houseman, CMA-N  Nuclear Technologist:  Domenic Polite, CNMT     Rest Procedure:  Myocardial perfusion imaging was performed at rest 45 minutes following the intravenous administration of Technetium 21m Sestamibi.  Rest ECG: No acute changes.  Stress Procedure:  The patient received IV Lexiscan 0.4 mg over 15-seconds.  Technetium 54m Sestamibi was injected at 30-seconds.  There were no significant  changes with Lexiscan.  Patient was very symptomatic with Lexiscan and she was given Aminophylline 75 mg with some relief.  Quantitative spect images were obtained after a 45 minute delay.  Stress ECG: No significant change from baseline ECG  QPS Raw Data Images:  Normal; no motion artifact; normal heart/lung ratio. Stress Images:  Normal homogeneous uptake in all areas of the myocardium. Rest Images:  Normal homogeneous uptake in all areas of the myocardium. Subtraction (SDS):  Normal Transient Ischemic Dilatation (Normal <1.22):  0.95 Lung/Heart Ratio (Normal <0.45):  0.25  Quantitative Gated Spect Images QGS EDV:  44 ml QGS ESV:  9 ml  Impression Exercise Capacity:  Lexiscan with no exercise. BP Response:  Normal blood pressure response. Clinical Symptoms:  Nausea ECG Impression:  No significant ST segment change suggestive of ischemia. Comparison with Prior Nuclear Study: No images to compare  Overall Impression:  Normal stress nuclear study.  LV Ejection Fraction: 79%.  LV Wall Motion:  NL LV Function; NL Wall Motion    Charlton Haws

## 2012-03-04 ENCOUNTER — Other Ambulatory Visit: Payer: PRIVATE HEALTH INSURANCE

## 2012-03-08 ENCOUNTER — Other Ambulatory Visit: Payer: PRIVATE HEALTH INSURANCE

## 2012-03-09 ENCOUNTER — Other Ambulatory Visit: Payer: PRIVATE HEALTH INSURANCE

## 2012-03-14 ENCOUNTER — Ambulatory Visit (INDEPENDENT_AMBULATORY_CARE_PROVIDER_SITE_OTHER): Payer: Self-pay | Admitting: Psychology

## 2012-03-14 ENCOUNTER — Other Ambulatory Visit: Payer: PRIVATE HEALTH INSURANCE

## 2012-03-14 DIAGNOSIS — F4323 Adjustment disorder with mixed anxiety and depressed mood: Secondary | ICD-10-CM

## 2012-03-17 ENCOUNTER — Ambulatory Visit: Payer: PRIVATE HEALTH INSURANCE | Admitting: Psychology

## 2012-03-22 ENCOUNTER — Ambulatory Visit (INDEPENDENT_AMBULATORY_CARE_PROVIDER_SITE_OTHER): Payer: Self-pay | Admitting: Psychology

## 2012-03-22 DIAGNOSIS — F4323 Adjustment disorder with mixed anxiety and depressed mood: Secondary | ICD-10-CM

## 2012-03-23 ENCOUNTER — Ambulatory Visit (INDEPENDENT_AMBULATORY_CARE_PROVIDER_SITE_OTHER): Payer: Self-pay | Admitting: Psychology

## 2012-03-23 DIAGNOSIS — F4323 Adjustment disorder with mixed anxiety and depressed mood: Secondary | ICD-10-CM

## 2012-03-24 ENCOUNTER — Ambulatory Visit: Payer: PRIVATE HEALTH INSURANCE | Admitting: Psychology

## 2012-04-05 ENCOUNTER — Ambulatory Visit
Admission: RE | Admit: 2012-04-05 | Discharge: 2012-04-05 | Disposition: A | Discharge status: Home / Self Care | Payer: PRIVATE HEALTH INSURANCE | Source: Ambulatory Visit | Attending: Gastroenterology | Admitting: Gastroenterology

## 2012-04-05 ENCOUNTER — Ambulatory Visit: Payer: Self-pay | Admitting: Psychology

## 2012-04-05 DIAGNOSIS — R112 Nausea with vomiting, unspecified: Secondary | ICD-10-CM

## 2012-04-05 DIAGNOSIS — R1084 Generalized abdominal pain: Secondary | ICD-10-CM

## 2012-04-05 DIAGNOSIS — K3184 Gastroparesis: Secondary | ICD-10-CM

## 2012-04-13 ENCOUNTER — Ambulatory Visit (INDEPENDENT_AMBULATORY_CARE_PROVIDER_SITE_OTHER): Payer: Self-pay | Admitting: Psychology

## 2012-04-13 DIAGNOSIS — F4323 Adjustment disorder with mixed anxiety and depressed mood: Secondary | ICD-10-CM

## 2012-05-27 ENCOUNTER — Encounter (HOSPITAL_COMMUNITY): Payer: Self-pay

## 2012-05-27 ENCOUNTER — Emergency Department (HOSPITAL_COMMUNITY): Payer: PRIVATE HEALTH INSURANCE

## 2012-05-27 ENCOUNTER — Emergency Department (HOSPITAL_COMMUNITY)
Admission: EM | Admit: 2012-05-27 | Discharge: 2012-05-27 | Disposition: A | Discharge status: Home / Self Care | Payer: PRIVATE HEALTH INSURANCE | Attending: Emergency Medicine | Admitting: Emergency Medicine

## 2012-05-27 DIAGNOSIS — R110 Nausea: Secondary | ICD-10-CM | POA: Insufficient documentation

## 2012-05-27 DIAGNOSIS — R109 Unspecified abdominal pain: Secondary | ICD-10-CM | POA: Insufficient documentation

## 2012-05-27 DIAGNOSIS — Z87448 Personal history of other diseases of urinary system: Secondary | ICD-10-CM | POA: Insufficient documentation

## 2012-05-27 DIAGNOSIS — E785 Hyperlipidemia, unspecified: Secondary | ICD-10-CM | POA: Insufficient documentation

## 2012-05-27 DIAGNOSIS — IMO0002 Reserved for concepts with insufficient information to code with codable children: Secondary | ICD-10-CM | POA: Insufficient documentation

## 2012-05-27 DIAGNOSIS — K3184 Gastroparesis: Secondary | ICD-10-CM | POA: Insufficient documentation

## 2012-05-27 DIAGNOSIS — Z791 Long term (current) use of non-steroidal anti-inflammatories (NSAID): Secondary | ICD-10-CM | POA: Insufficient documentation

## 2012-05-27 DIAGNOSIS — Z79899 Other long term (current) drug therapy: Secondary | ICD-10-CM | POA: Insufficient documentation

## 2012-05-27 DIAGNOSIS — R748 Abnormal levels of other serum enzymes: Secondary | ICD-10-CM | POA: Insufficient documentation

## 2012-05-27 LAB — CBC WITH DIFFERENTIAL/PLATELET
Basophils Absolute: 0 10*3/uL (ref 0.0–0.1)
Basophils Relative: 0 % (ref 0–1)
Lymphocytes Relative: 21 % (ref 12–46)
MCHC: 34.4 g/dL (ref 30.0–36.0)
Neutro Abs: 8 10*3/uL — ABNORMAL HIGH (ref 1.7–7.7)
Platelets: 328 10*3/uL (ref 150–400)
RDW: 12.6 % (ref 11.5–15.5)
WBC: 10.9 10*3/uL — ABNORMAL HIGH (ref 4.0–10.5)

## 2012-05-27 LAB — COMPREHENSIVE METABOLIC PANEL
ALT: 25 U/L (ref 0–35)
AST: 16 U/L (ref 0–37)
Albumin: 3.5 g/dL (ref 3.5–5.2)
CO2: 24 mEq/L (ref 19–32)
Calcium: 9 mg/dL (ref 8.4–10.5)
Chloride: 103 mEq/L (ref 96–112)
GFR calc non Af Amer: 78 mL/min — ABNORMAL LOW (ref >90)
Sodium: 138 mEq/L (ref 135–145)
Total Bilirubin: 0.4 mg/dL (ref 0.3–1.2)

## 2012-05-27 MED ORDER — MORPHINE SULFATE 4 MG/ML IJ SOLN
4.0000 mg | Freq: Once | INTRAMUSCULAR | Status: AC
  Administered 2012-05-27: 4 mg via INTRAVENOUS
  Filled 2012-05-27: qty 1

## 2012-05-27 MED ORDER — SODIUM CHLORIDE 0.9 % IV BOLUS (SEPSIS)
1000.0000 mL | Freq: Once | INTRAVENOUS | Status: AC
  Administered 2012-05-27: 1000 mL via INTRAVENOUS

## 2012-05-27 MED ORDER — OMEPRAZOLE 20 MG PO CPDR: 20.0000 mg | DELAYED_RELEASE_CAPSULE | Freq: Every day | ORAL | Status: DC

## 2012-05-27 MED ORDER — BUTORPHANOL TARTRATE 1 MG/ML IJ SOLN: 1.0000 mg | Freq: Once | INTRAMUSCULAR | Status: DC

## 2012-05-27 MED ORDER — PROMETHAZINE HCL 25 MG/ML IJ SOLN
12.5000 mg | Freq: Once | INTRAMUSCULAR | Status: AC
  Administered 2012-05-27: 12.5 mg via INTRAVENOUS
  Filled 2012-05-27: qty 1

## 2012-05-27 MED ORDER — ONDANSETRON 8 MG PO TBDP: ORAL_TABLET | ORAL | Status: DC

## 2012-05-27 MED ORDER — POLYETHYLENE GLYCOL 3350 17 GM/SCOOP PO POWD: 17.0000 g | Freq: Every day | ORAL | Status: DC

## 2012-05-27 MED ORDER — SUCRALFATE 1 G PO TABS: 1.0000 g | ORAL_TABLET | Freq: Four times a day (QID) | ORAL | Status: DC

## 2012-05-27 NOTE — ED Provider Notes (Signed)
History     CSN: [id number]  Arrival date & time 05/27/12  0031   First MD Initiated Contact with Patient 05/27/12 0041      Chief Complaint  Patient presents with  . Abdominal Pain   HPI  History provided by the patient. Patient is a 57 year old female with history of hyperlipidemia, interstitial cystitis and gastroparesis presents with complaints of sharp, severe cramping abdominal pain. Patient states symptoms began around 9 PM and have been persistent and worsening. Pain is cramping with some waxing and waning and is described as very severe. Patient states symptoms feel more severe than typical gastroparesis. Symptoms are associated with nausea but no episodes of vomiting. Patient does report using a suppository for symptoms prior to arrival without any improvement. Patient does not remember her last bowel movement. She denies any rectal bleeding or melena. She denies any fever, chills or sweats. Denies any other aggravating or alleviating factors. Denies any other associated symptoms.    Past Medical History  Diagnosis Date  . Interstitial cystitis 1992  . Hyperlipidemia   . Gastroparesis     Past Surgical History  Procedure Date  . Abdominal hysterectomy 1998  . Fracture surgery 01/2011    R) foot    Family History  Problem Relation Age of Onset  . Heart disease Father   . Heart attack Father     History  Substance Use Topics  . Smoking status: Never Smoker   . Smokeless tobacco: Not on file  . Alcohol Use: No    OB History    Grav Para Term Preterm Abortions TAB SAB Ect Mult Living   2 2 2       2       Review of Systems  Constitutional: Negative for fever, chills, diaphoresis and appetite change.  Respiratory: Negative for shortness of breath.   Cardiovascular: Negative for chest pain.  Gastrointestinal: Positive for nausea and abdominal pain. Negative for vomiting, diarrhea and blood in stool.  All other systems reviewed and are  negative.    Allergies  Aspirin; Iohexol; and Nsaids  Home Medications   Current Outpatient Rx  Name  Route  Sig  Dispense  Refill  . ALPRAZOLAM 0.5 MG PO TABS      as needed.          . DRONABINOL 2.5 MG PO CAPS   Oral   Take 2.5 mg by mouth 2 (two) times daily before a meal.           . FLUOXETINE HCL 10 MG PO TABS      TAKE 3 TABS DAILY         . HYDROCODONE-ACETAMINOPHEN 5-325 MG PO TABS      as needed.          Marland Kitchen ONDANSETRON HCL 4 MG PO TABS   Oral   Take 8 mg by mouth every 8 (eight) hours as needed. For nausea         . POTASSIUM CHLORIDE CRYS ER 20 MEQ PO TBCR      Take 2 tablet by mouth today and tomorrow, and  one tablet thereafter.   30 tablet   4   . PROMETHAZINE HCL 25 MG PO TABS   Oral   Take 25 mg by mouth every 6 (six) hours as needed. nausea          . ROSUVASTATIN CALCIUM 20 MG PO TABS   Oral   Take 1 tablet (20 mg total) by mouth at  bedtime.   30 tablet   6   . TOPIRAMATE 25 MG PO TABS   Oral   Take 25 mg by mouth daily.          . TRIAMCINOLONE ACETONIDE 0.1 % EX OINT               . TRIAMTERENE-HCTZ 75-50 MG PO TABS      1 tablet as needed. For tinnitus         . ZOLPIDEM TARTRATE 10 MG PO TABS   Oral   Take 10 mg by mouth at bedtime as needed. sleep            BP 152/77  Pulse 70  Temp 97.4 F (36.3 C) (Oral)  Resp 20  SpO2 100%  Physical Exam  Nursing note and vitals reviewed. Constitutional: She is oriented to person, place, and time. She appears well-developed and well-nourished. No distress.  HENT:  Head: Normocephalic.  Cardiovascular: Normal rate and regular rhythm.   No murmur heard. Pulmonary/Chest: Effort normal and breath sounds normal. No respiratory distress. She has no wheezes. She has no rales.  Abdominal: Soft. Bowel sounds are normal. She exhibits no distension. There is tenderness. There is no rebound and no guarding.       Diffuse tenderness. Tympanic to percussion throughout.   Neurological: She is alert and oriented to person, place, and time.  Skin: Skin is warm and dry.  Psychiatric: She has a normal mood and affect. Her behavior is normal.    ED Course  Procedures   Results for orders placed during the hospital encounter of 05/27/12  CBC WITH DIFFERENTIAL      Component Value Range   WBC 10.9 (*) 4.0 - 10.5 K/uL   RBC 4.79  3.87 - 5.11 MIL/uL   Hemoglobin 14.6  12.0 - 15.0 g/dL   HCT 16.1  09.6 - 04.5 %   MCV 88.7  78.0 - 100.0 fL   MCH 30.5  26.0 - 34.0 pg   MCHC 34.4  30.0 - 36.0 g/dL   RDW 40.9  81.1 - 91.4 %   Platelets 328  150 - 400 K/uL   Neutrophils Relative 73  43 - 77 %   Neutro Abs 8.0 (*) 1.7 - 7.7 K/uL   Lymphocytes Relative 21  12 - 46 %   Lymphs Abs 2.3  0.7 - 4.0 K/uL   Monocytes Relative 5  3 - 12 %   Monocytes Absolute 0.6  0.1 - 1.0 K/uL   Eosinophils Relative 1  0 - 5 %   Eosinophils Absolute 0.1  0.0 - 0.7 K/uL   Basophils Relative 0  0 - 1 %   Basophils Absolute 0.0  0.0 - 0.1 K/uL  COMPREHENSIVE METABOLIC PANEL      Component Value Range   Sodium 138  135 - 145 mEq/L   Potassium 3.7  3.5 - 5.1 mEq/L   Chloride 103  96 - 112 mEq/L   CO2 24  19 - 32 mEq/L   Glucose, Bld 123 (*) 70 - 99 mg/dL   BUN 20  6 - 23 mg/dL   Creatinine, Ser 7.82  0.50 - 1.10 mg/dL   Calcium 9.0  8.4 - 95.6 mg/dL   Total Protein 6.7  6.0 - 8.3 g/dL   Albumin 3.5  3.5 - 5.2 g/dL   AST 16  0 - 37 U/L   ALT 25  0 - 35 U/L   Alkaline Phosphatase 81  39 - 117 U/L   Total Bilirubin 0.4  0.3 - 1.2 mg/dL   GFR calc non Af Amer 78 (*) >90 mL/min   GFR calc Af Amer >90  >90 mL/min  LIPASE, BLOOD      Component Value Range   Lipase 74 (*) 11 - 59 U/L       Ct Abdomen Pelvis Wo Contrast  05/27/2012  *RADIOLOGY REPORT*  Clinical Data: Severe abdominal pain.  White cell count 10.9. Elevated lipase.  Contrast material was not administered due to history of severe contrast allergy.  CT ABDOMEN AND PELVIS WITHOUT CONTRAST  Technique:  Multidetector  CT imaging of the abdomen and pelvis was performed following the standard protocol without intravenous contrast.  Comparison: 04/05/2012  Findings: Atelectasis in the lung bases.  Small esophageal hiatal hernia.  The unenhanced appearance of the liver, spleen, gallbladder, pancreas, adrenal glands, abdominal aorta, and retroperitoneal lymph nodes is unremarkable.  Multiple calcifications in the kidneys bilaterally likely representing punctate stones.  No pyelocaliectasis or ureterectasis.  No ureteral stones.  No free air or free fluid in the abdomen.  The stomach, small bowel, and colon are decompressed.  Diffusely stool filled colon.  Pelvis:  The uterus appears to be surgically absent.  Right ovary is not enlarged.  Left ovary is not identified.  No free or loculated pelvic fluid collections.  No bladder wall thickening. No significant pelvic lymphadenopathy.  The appendix is normal.  No evidence of diverticulitis.  Degenerative changes in the spine.  IMPRESSION: Multiple punctate nonobstructing stones in both kidneys.  No acute process demonstrated in the abdomen or pelvis.   Original Report Authenticated By: Burman Nieves, M.D.    Dg Abd Acute W/chest  05/27/2012  *RADIOLOGY REPORT*  Clinical Data: Abdominal pain, nausea, constipation.  ACUTE ABDOMEN SERIES (ABDOMEN 2 VIEW & CHEST 1 VIEW)  Comparison: Chest 10/10/2009.  CT abdomen and pelvis 04/05/2012  Findings: Shallow inspiration. Normal heart size and pulmonary vascularity.  No focal consolidation in the lungs.  No blunting of costophrenic angles.  No significant change since previous study.  Scattered gas and stool in the colon.  No small or large bowel dilatation.  No free intra-abdominal air.  No abnormal air fluid levels.  No radiopaque stones.  IMPRESSION: No evidence of active pulmonary disease.  Nonobstructive bowel gas pattern.   Original Report Authenticated By: Burman Nieves, M.D.      1. Abdominal pain   2. Elevated lipase        MDM  12:45AM patient seen and evaluated. Patient holding rubbing stomach appears in moderate discomfort.  Patient reports feeling significant better after medications. I discussed in depth options for continued evaluation of slightly elevated lipase. At this time we will proceed with a CT scan of abdomen to rule out acute pancreatitis as source of patient's pain.  Patient continues to feel comfortable. CT scan unremarkable without signs for acute cause of patient's symptoms. Patient does have plans to followup with GI specialist. At this time is that she may be discharged home. She agrees with this plan.    Angus Seller, [place] 05/28/12 [phone]

## 2012-05-27 NOTE — ED Notes (Signed)
Pt complains of severe abd pain that started about 2 hours ago, history of gastroparesis but the pain has never been this bad

## 2012-05-27 NOTE — ED Notes (Signed)
Pt states that she took a vicodin about one hour ago

## 2012-05-27 NOTE — ED Notes (Signed)
Patient transported to CT 

## 2012-05-27 NOTE — ED Notes (Addendum)
Patient transported to x-ray. ?

## 2012-05-28 NOTE — ED Provider Notes (Signed)
Medical screening examination/treatment/procedure(s) were performed by non-physician practitioner and as supervising physician I was immediately available for consultation/collaboration.  Marwan T Powers, MD 05/28/12 2307 

## 2012-06-01 ENCOUNTER — Other Ambulatory Visit (HOSPITAL_COMMUNITY): Payer: Self-pay | Admitting: Gastroenterology

## 2012-06-01 ENCOUNTER — Other Ambulatory Visit: Payer: Self-pay | Admitting: Gastroenterology

## 2012-06-01 DIAGNOSIS — K3184 Gastroparesis: Secondary | ICD-10-CM

## 2012-06-01 DIAGNOSIS — R109 Unspecified abdominal pain: Secondary | ICD-10-CM

## 2012-06-06 ENCOUNTER — Other Ambulatory Visit (HOSPITAL_COMMUNITY): Payer: Self-pay

## 2012-06-06 ENCOUNTER — Encounter (HOSPITAL_COMMUNITY)
Admission: RE | Admit: 2012-06-06 | Discharge: 2012-06-06 | Disposition: A | Discharge status: Home / Self Care | Payer: PRIVATE HEALTH INSURANCE | Source: Ambulatory Visit | Attending: Gastroenterology | Admitting: Gastroenterology

## 2012-06-06 DIAGNOSIS — K3184 Gastroparesis: Secondary | ICD-10-CM | POA: Insufficient documentation

## 2012-06-06 DIAGNOSIS — R109 Unspecified abdominal pain: Secondary | ICD-10-CM | POA: Insufficient documentation

## 2012-06-06 MED ORDER — SINCALIDE 5 MCG IJ SOLR
INTRAMUSCULAR | Status: AC
  Administered 2012-06-06: 1.41 ug via INTRAVENOUS
  Filled 2012-06-06: qty 5

## 2012-06-06 MED ORDER — TECHNETIUM TC 99M MEBROFENIN IV KIT
5.0000 | PACK | Freq: Once | INTRAVENOUS | Status: AC | PRN
  Administered 2012-06-06: 5 via INTRAVENOUS

## 2012-06-06 MED ORDER — SINCALIDE 5 MCG IJ SOLR
0.0200 ug/kg | Freq: Once | INTRAMUSCULAR | Status: AC
  Administered 2012-06-06: 1.41 ug via INTRAVENOUS

## 2012-06-17 ENCOUNTER — Encounter (HOSPITAL_COMMUNITY): Payer: Self-pay | Admitting: *Deleted

## 2012-06-17 NOTE — Pre-Procedure Instructions (Signed)
Your procedure is scheduled on:Wednesday, June 29, 2012 Report to [facility] Admitting [id number] Call this number if you have problems morning of your [phone]  Follow all bowel prep instructions per your doctor's orders.  Do not eat or drink anything after midnight the night before your procedure. You may brush your teeth, rinse out your mouth, but no water, no food, no chewing gum, no mints, no candies, no chewing tobacco.     Take these medicines the morning of your procedure with A SIP OF WATER:Prilosec and Prozac   Please make arrangements for a responsible person to drive you home after the procedure. You cannot go home by cab/taxi. We recommend you have someone with you at home the first 24 hours after your procedure. Driver for procedure is spouse Olivia Barker [phone]  LEAVE ALL VALUABLES, JEWELRY, BILLFOLD AT HOME.  NO DENTURES, CONTACT LENSES ALLOWED IN THE ENDOSCOPY ROOM.   YOU MAY WEAR DEODORANT, PLEASE REMOVE ALL JEWELRY, WATCHES RINGS, BODY PIERCINGS AND LEAVE AT HOME.   WOMEN: NO MAKE-UP, LOTIONS PERFUMES

## 2012-06-27 ENCOUNTER — Encounter (HOSPITAL_COMMUNITY): Payer: Self-pay | Admitting: Pharmacy Technician

## 2012-06-29 ENCOUNTER — Ambulatory Visit (HOSPITAL_COMMUNITY)
Admission: RE | Admit: 2012-06-29 | Discharge: 2012-06-29 | Disposition: A | Discharge status: Hospice | Payer: PRIVATE HEALTH INSURANCE | Source: Ambulatory Visit | Attending: Gastroenterology | Admitting: Gastroenterology

## 2012-06-29 ENCOUNTER — Encounter (HOSPITAL_COMMUNITY)
Admission: RE | Disposition: A | Discharge status: Hospice | Payer: Self-pay | Source: Ambulatory Visit | Attending: Gastroenterology

## 2012-06-29 ENCOUNTER — Encounter (HOSPITAL_COMMUNITY): Payer: Self-pay | Admitting: *Deleted

## 2012-06-29 ENCOUNTER — Ambulatory Visit (HOSPITAL_COMMUNITY): Payer: PRIVATE HEALTH INSURANCE | Admitting: *Deleted

## 2012-06-29 DIAGNOSIS — K219 Gastro-esophageal reflux disease without esophagitis: Secondary | ICD-10-CM | POA: Insufficient documentation

## 2012-06-29 DIAGNOSIS — R109 Unspecified abdominal pain: Secondary | ICD-10-CM | POA: Insufficient documentation

## 2012-06-29 DIAGNOSIS — K319 Disease of stomach and duodenum, unspecified: Secondary | ICD-10-CM | POA: Insufficient documentation

## 2012-06-29 HISTORY — PX: EUS: SHX5427

## 2012-06-29 SURGERY — ESOPHAGEAL ENDOSCOPIC ULTRASOUND (EUS) RADIAL
Anesthesia: Monitor Anesthesia Care

## 2012-06-29 MED ORDER — DEXAMETHASONE SODIUM PHOSPHATE 10 MG/ML IJ SOLN
INTRAMUSCULAR | Status: DC | PRN
  Administered 2012-06-29: 10 mg via INTRAVENOUS

## 2012-06-29 MED ORDER — ONDANSETRON HCL 4 MG/2ML IJ SOLN
INTRAMUSCULAR | Status: DC | PRN
  Administered 2012-06-29: 4 mg via INTRAVENOUS

## 2012-06-29 MED ORDER — SODIUM CHLORIDE 0.9 % IV SOLN: INTRAVENOUS | Status: DC

## 2012-06-29 MED ORDER — PROPOFOL 10 MG/ML IV EMUL
INTRAVENOUS | Status: DC | PRN
  Administered 2012-06-29: 200 ug/kg/min via INTRAVENOUS

## 2012-06-29 MED ORDER — METOCLOPRAMIDE HCL 5 MG/ML IJ SOLN
INTRAMUSCULAR | Status: DC | PRN
  Administered 2012-06-29: 10 mg via INTRAVENOUS

## 2012-06-29 MED ORDER — LACTATED RINGERS IV SOLN
INTRAVENOUS | Status: DC
  Administered 2012-06-29: 1000 mL via INTRAVENOUS
  Administered 2012-06-29: 13:00:00 via INTRAVENOUS

## 2012-06-29 MED ORDER — BUTAMBEN-TETRACAINE-BENZOCAINE 2-2-14 % EX AERO
INHALATION_SPRAY | CUTANEOUS | Status: DC | PRN
  Administered 2012-06-29: 2 via TOPICAL

## 2012-06-29 MED ORDER — PROPOFOL 10 MG/ML IV BOLUS
INTRAVENOUS | Status: DC | PRN
  Administered 2012-06-29: 50 mg via INTRAVENOUS

## 2012-06-29 NOTE — Preoperative (Signed)
Beta Blockers   Reason not to administer Beta Blockers:Not Applicable, not on home BB 

## 2012-06-29 NOTE — Op Note (Signed)
[facility] [address] [place], [address]   ENDOSCOPIC ULTRASOUND PROCEDURE REPORT  PATIENT: Olivia Barker, Olivia Barker  MR#: [id number] BIRTHDATE: Feb 03, 1956  GENDER: Female ENDOSCOPIST: Willis Modena, MD REFERRED BY:  Vida Rigger, MD; Barbette Or, MD PROCEDURE DATE:  06/29/2012 PROCEDURE:   Endoscopic ultrasound with fine needle aspiration  ASA CLASS:      ASA-II INDICATIONS:   Gastric nodule; abdominal pain MEDICATIONS: Cetacaine spray x 2; MAC anesthesia per CRNA  DESCRIPTION OF PROCEDURE:   After the risks benefits and alternatives of the procedure were  explained, informed consent was obtained. The patient was then placed in the left, lateral, decubitus postion and IV sedation was administered. Throughout the procedure, the patients blood pressure, pulse and oxygen saturations were monitored continuously.  Under direct visualization, the Pentax EUS Radial [id number] and Pentax Linear [id number]  endoscope was introduced through the mouth  and advanced to the    .  Water was used as necessary to provide an acoustic interface.  Upon completion of the imaging, water was removed and the patient was sent to the recovery room in satisfactory condition.       FINDINGS: Heterogenous pancreatic parenchyma diffusely of unclear significance; chronic pancreatitis not definitively excluded but is felt unlikely.  Bile and pancreatic ducts normal; no wall thickening or bile duct stones seen.  Gallbladder normal without stones, sludge, or wall thickening.  Hyperechoic liver consistent with hepatic steatosis. Submucosal nodule of GE junction seen, measuring about 23 x 13 mm in size; lesion is hypoechoic, well-defined and appears to arise from either muscularis mucosa (favored) or mucosa (less favored); there is preserved submucosa and muscularis propria deep to the lesion.  Overlying mucosa appears normal.  Lesion biopsied x 5 (3 with 22-g needle, 2 with 19-g needle, some  with and some without suction).  IMPRESSION:     As above.  No evidence of gallbladder or biliary tract pathology.  GE junction nodule favored to be leiomyoma versus GI stromal tumor (GIST).  RECOMMENDATIONS:     1.  Watch for potential complications of procedure. 2.  Await cytology results. 3.  Will discuss with Dr. Ewing Schlein.   _______________________________ Rosalie DoctorWillis Modena, MD 06/29/2012 1:26 PM   CC:  PATIENT NAME:  Olivia Barker, Olivia Barker MR#: [id number]

## 2012-06-29 NOTE — Anesthesia Preprocedure Evaluation (Addendum)
Anesthesia Evaluation  Patient identified by MRN, date of birth, ID band Patient awake    Reviewed: Allergy & Precautions, H&P , NPO status , Patient's Chart, lab work & pertinent test results  Airway Mallampati: II TM Distance: >3 FB Neck ROM: Full    Dental  (+) Teeth Intact and Dental Advisory Given   Pulmonary neg pulmonary ROS,  breath sounds clear to auscultation  Pulmonary exam normal       Cardiovascular negative cardio ROS  Rhythm:Regular Rate:Normal     Neuro/Psych negative neurological ROS  negative psych ROS   GI/Hepatic Neg liver ROS, GERD-  Medicated,  Endo/Other  negative endocrine ROS  Renal/GU negative Renal ROS   Interstitial cystitis negative genitourinary   Musculoskeletal negative musculoskeletal ROS (+)   Abdominal   Peds  Hematology negative hematology ROS (+)   Anesthesia Other Findings   Reproductive/Obstetrics negative OB ROS                          Anesthesia Physical Anesthesia Plan  ASA: II  Anesthesia Plan: MAC   Post-op Pain Management:    Induction:   Airway Management Planned: Nasal Cannula  Additional Equipment:   Intra-op Plan:   Post-operative Plan:   Informed Consent: I have reviewed the patients History and Physical, chart, labs and discussed the procedure including the risks, benefits and alternatives for the proposed anesthesia with the patient or authorized representative who has indicated his/her understanding and acceptance.   Dental advisory given  Plan Discussed with: CRNA  Anesthesia Plan Comments:         Anesthesia Quick Evaluation

## 2012-06-29 NOTE — H&P (Signed)
Patient interval history reviewed.  Patient examined again.  There has been no change from documented H/P dated 06/01/12 (scanned into chart from our office) except as documented above.  Assessment:  1.  Gastric polyp. 2.  Upper abdominal pain.  Plan:  1.  Endoscopic ultrasound. 2.  Risks (bleeding, infection, bowel perforation that could require surgery, sedation-related changes in cardiopulmonary systems), benefits (identification and possible treatment of source of symptoms, exclusion of certain causes of symptoms), and alternatives (watchful waiting, radiographic imaging studies, empiric medical treatment) of upper endoscopy with ultrasound (EUS) were explained to patient/husband in detail and patient wishes to proceed.

## 2012-06-29 NOTE — Transfer of Care (Signed)
Immediate Anesthesia Transfer of Care Note  Patient: Olivia Barker  Procedure(s) Performed: Procedure(s): ESOPHAGEAL ENDOSCOPIC ULTRASOUND (EUS) RADIAL (N/A)  Patient Location: PACU  Anesthesia Type:MAC  Level of Consciousness: awake, alert , oriented, patient cooperative and responds to stimulation  Airway & Oxygen Therapy: Patient Spontanous Breathing and Patient connected to nasal cannula oxygen  Post-op Assessment: Report given to PACU RN, Post -op Vital signs reviewed and stable and Patient moving all extremities X 4  Post vital signs: Reviewed and stable  Complications: No apparent anesthesia complications

## 2012-06-29 NOTE — Anesthesia Postprocedure Evaluation (Signed)
Anesthesia Post Note  Patient: Olivia Barker  Procedure(s) Performed: Procedure(s) (LRB): ESOPHAGEAL ENDOSCOPIC ULTRASOUND (EUS) RADIAL (N/A)  Anesthesia type: MAC  Patient location: PACU  Post pain: Pain level controlled  Post assessment: Post-op Vital signs reviewed  Last Vitals:  Filed Vitals:   06/29/12 1400  BP: 122/76  Temp:   Resp: 11    Post vital signs: Reviewed  Level of consciousness: sedated  Complications: No apparent anesthesia complications

## 2012-06-30 ENCOUNTER — Encounter (HOSPITAL_COMMUNITY): Payer: Self-pay | Admitting: Gastroenterology

## 2012-07-18 ENCOUNTER — Ambulatory Visit: Payer: PRIVATE HEALTH INSURANCE | Admitting: Internal Medicine

## 2012-08-09 HISTORY — PX: CHOLECYSTECTOMY: SHX55

## 2012-08-26 ENCOUNTER — Encounter: Payer: Self-pay | Admitting: Internal Medicine

## 2012-09-20 ENCOUNTER — Other Ambulatory Visit: Payer: Self-pay

## 2012-09-20 MED ORDER — ROSUVASTATIN CALCIUM 20 MG PO TABS: 20.0000 mg | ORAL_TABLET | Freq: Every day | ORAL | Status: DC

## 2012-09-29 ENCOUNTER — Ambulatory Visit: Payer: PRIVATE HEALTH INSURANCE | Admitting: Internal Medicine

## 2012-12-09 ENCOUNTER — Ambulatory Visit (INDEPENDENT_AMBULATORY_CARE_PROVIDER_SITE_OTHER): Payer: BC Managed Care – PPO | Admitting: Internal Medicine

## 2012-12-09 ENCOUNTER — Encounter: Payer: Self-pay | Admitting: Internal Medicine

## 2012-12-09 VITALS — BP 126/88 | HR 71 | Ht 63.0 in | Wt 153.0 lb

## 2012-12-09 DIAGNOSIS — E782 Mixed hyperlipidemia: Secondary | ICD-10-CM

## 2012-12-09 NOTE — Progress Notes (Signed)
HPI Patinet is a 57 yo with a history of hyperlipidemia, HTN  I saw her last year  She was seen once in the fall by M lenze after a syncopal spell  Probably occurred due to dehydration.  Myoview done This spring she had exacerbation of her abdominal pain.  Finally evaluated at [facility]  Had a choley by Dr Zacarias Pontes bladder wasd severely inflamed  She has fet great since  No Cp No SOB  Appetite is great.   Allergies  Allergen Reactions  . Aspirin   . Iohexol Anaphylaxis    During ivp in 1993 pt's throat began to swell w/ anaphylaxis. No studies have been done w/ 13 hr prep//a.cal[facility]oun   . Nsaids Anaphylaxis    Current Outpatient Prescriptions  Medication Sig Dispense Refill  . ALPRAZolam (XANAX) 0.5 MG tablet Take 0.25-0.5 mg by mouth daily as needed for anxiety.       . dronabinol (MARINOL) 2.5 MG capsule Take 2.5 mg by mouth 2 (two) times daily as needed. For nausea      . FLUoxetine (PROZAC) 40 MG capsule Take 40 mg by mouth every morning.       Marland Kitchen HYDROcodone-acetaminophen (NORCO/VICODIN) 5-325 MG per tablet Take 1 tablet by mouth every 4 (four) hours as needed. For pain      . ondansetron (ZOFRAN) 4 MG tablet Take 8 mg by mouth every 8 (eight) hours as needed. For nausea      . promethazine (PHENERGAN) 25 MG tablet Take 25 mg by mouth every 6 (six) hours as needed for nausea.       . rosuvastatin (CRESTOR) 20 MG tablet Take 1 tablet (20 mg total) by mouth at bedtime.  30 tablet  4  . triamterene-hydrochlorothiazide (MAXZIDE) 75-50 MG per tablet Take 1 tablet by mouth daily as needed. For fluid in her ear.      . zolpidem (AMBIEN) 10 MG tablet Take 10 mg by mouth at bedtime.        No current facility-administered medications for this visit.    Past Medical History  Diagnosis Date  . Interstitial cystitis 1992  . Hyperlipidemia   . Gastroparesis     Past Surgical History  Procedure Laterality Date  . Abdominal hysterectomy  1998  . Fracture surgery  01/2011    R) foot  .  Cesarean section    . Eus N/A 06/29/2012    Procedure: ESOPHAGEAL ENDOSCOPIC ULTRASOUND (EUS) RADIAL;  Surgeon: Willis Modena, MD;  Location: [facility] ENDOSCOPY;  Service: Endoscopy;  Laterality: N/A;    Family History  Problem Relation Age of Onset  . Heart disease Father   . Heart attack Father     History   Social History  . Marital Status: Married    Spouse Name: N/A    Number of Children: N/A  . Years of Education: N/A   Occupational History  . Not on file.   Social History Main Topics  . Smoking status: Never Smoker   . Smokeless tobacco: Never Used  . Alcohol Use: No  . Drug Use: No  . Sexually Active: Yes   Other Topics Concern  . Not on file   Social History Narrative  . No narrative on file    Review of Systems:  All systems reviewed.  They are negative to the above problem except as previously stated.  Vital Signs: BP 126/88  Pulse 71  Ht 5\' 3"  (1.6 m)  Wt 153 lb (69.4 kg)  BMI 27.11  kg/m2  Physical Exam Patient is in NAD HEENT:  Normocephalic, atraumatic. EOMI, PERRLA.  Neck: JVP is normal.  No bruits.  Lungs: clear to auscultation. No rales no wheezes.  Heart: Regular rate and rhythm. Normal S1, S2. No S3.   No significant murmurs. PMI not displaced.  Abdomen:  Supple, nontender. Normal bowel sounds. No masses. No hepatomegaly.  Extremities:   Good distal pulses throughout. No lower extremity edema.  Musculoskeletal :moving all extremities.  Neuro:   alert and oriented x3.  CN II-XII grossly intact.  EKG:  SR 71 bpm. Assessment and Plan:  1.  Hyperlipiedemia  Patient has been off of her crestor for a few months.  Would set up for lipomed panel when she is fasting  Continue to stay active.

## 2012-12-09 NOTE — Patient Instructions (Addendum)
You will need fasting lipomed & bmp next week.  We will call you with your results   No medication changes were made today

## 2012-12-14 ENCOUNTER — Other Ambulatory Visit (INDEPENDENT_AMBULATORY_CARE_PROVIDER_SITE_OTHER): Payer: BC Managed Care – PPO

## 2012-12-14 DIAGNOSIS — E782 Mixed hyperlipidemia: Secondary | ICD-10-CM

## 2012-12-14 LAB — BASIC METABOLIC PANEL
BUN: 12 mg/dL (ref 6–23)
Calcium: 9 mg/dL (ref 8.4–10.5)
Creatinine, Ser: 0.8 mg/dL (ref 0.4–1.2)
GFR: 84.92 mL/min (ref >60.00)
Potassium: 3.5 mEq/L (ref 3.5–5.1)

## 2012-12-15 LAB — NMR LIPOPROFILE WITH LIPIDS
HDL Size: 8.9 nm — ABNORMAL LOW (ref >=9.2)
LDL Particle Number: 2552 nmol/L — ABNORMAL HIGH (ref <1000)
LDL Size: 20.6 nm (ref >20.5)
Large VLDL-P: 3.8 nmol/L — ABNORMAL HIGH (ref <=2.7)
Triglycerides: 295 mg/dL — ABNORMAL HIGH (ref <150)
VLDL Size: 45.1 nm (ref <=46.6)

## 2012-12-27 ENCOUNTER — Encounter: Payer: Self-pay | Admitting: Internal Medicine

## 2012-12-30 ENCOUNTER — Telehealth: Payer: Self-pay | Admitting: *Deleted

## 2012-12-30 DIAGNOSIS — E785 Hyperlipidemia, unspecified: Secondary | ICD-10-CM

## 2012-12-30 NOTE — Telephone Encounter (Signed)
Message copied by Barrie Folk on Fri Dec 30, 2012  2:30 PM ------      Message from: Dietrich Pates V      Created: Thu Dec 15, 2012  8:59 AM       Electrolytes are OK  Waiting on lipids. ------

## 2012-12-30 NOTE — Telephone Encounter (Signed)
[facility] Dr. Tenny Craw does want pt to restart crestor. Samples placed at front desk today by Wynona Canes. Labs in 10-12 weeks: Lipomed, AST, HbA1C  Mylo Red RN

## 2013-03-15 ENCOUNTER — Other Ambulatory Visit: Payer: Self-pay | Admitting: Obstetrics and Gynecology

## 2013-03-16 ENCOUNTER — Other Ambulatory Visit: Payer: Self-pay

## 2013-04-12 ENCOUNTER — Emergency Department (HOSPITAL_COMMUNITY)
Admission: EM | Admit: 2013-04-12 | Discharge: 2013-04-12 | Disposition: A | Discharge status: Home / Self Care | Payer: BC Managed Care – PPO | Attending: Emergency Medicine | Admitting: Emergency Medicine

## 2013-04-12 ENCOUNTER — Encounter (HOSPITAL_COMMUNITY): Payer: Self-pay | Admitting: Emergency Medicine

## 2013-04-12 DIAGNOSIS — Z90710 Acquired absence of both cervix and uterus: Secondary | ICD-10-CM | POA: Insufficient documentation

## 2013-04-12 DIAGNOSIS — Z8742 Personal history of other diseases of the female genital tract: Secondary | ICD-10-CM | POA: Insufficient documentation

## 2013-04-12 DIAGNOSIS — R61 Generalized hyperhidrosis: Secondary | ICD-10-CM | POA: Insufficient documentation

## 2013-04-12 DIAGNOSIS — M545 Low back pain, unspecified: Secondary | ICD-10-CM | POA: Insufficient documentation

## 2013-04-12 DIAGNOSIS — R112 Nausea with vomiting, unspecified: Secondary | ICD-10-CM | POA: Insufficient documentation

## 2013-04-12 DIAGNOSIS — Z8719 Personal history of other diseases of the digestive system: Secondary | ICD-10-CM | POA: Insufficient documentation

## 2013-04-12 DIAGNOSIS — Z79899 Other long term (current) drug therapy: Secondary | ICD-10-CM | POA: Insufficient documentation

## 2013-04-12 DIAGNOSIS — E785 Hyperlipidemia, unspecified: Secondary | ICD-10-CM | POA: Insufficient documentation

## 2013-04-12 DIAGNOSIS — R1084 Generalized abdominal pain: Secondary | ICD-10-CM | POA: Insufficient documentation

## 2013-04-12 DIAGNOSIS — R000 Tachycardia, unspecified: Secondary | ICD-10-CM | POA: Insufficient documentation

## 2013-04-12 DIAGNOSIS — Z9089 Acquired absence of other organs: Secondary | ICD-10-CM | POA: Insufficient documentation

## 2013-04-12 DIAGNOSIS — R109 Unspecified abdominal pain: Secondary | ICD-10-CM

## 2013-04-12 LAB — CBC WITH DIFFERENTIAL/PLATELET
Basophils Absolute: 0 K/uL (ref 0.0–0.1)
Basophils Relative: 0 % (ref 0–1)
Eosinophils Absolute: 0.1 K/uL (ref 0.0–0.7)
Eosinophils Relative: 1 % (ref 0–5)
HCT: 43.2 % (ref 36.0–46.0)
Hemoglobin: 14.8 g/dL (ref 12.0–15.0)
Lymphocytes Relative: 18 % (ref 12–46)
Lymphs Abs: 2.4 K/uL (ref 0.7–4.0)
MCH: 30.5 pg (ref 26.0–34.0)
MCHC: 34.3 g/dL (ref 30.0–36.0)
MCV: 89.1 fL (ref 78.0–100.0)
Monocytes Absolute: 0.5 K/uL (ref 0.1–1.0)
Monocytes Relative: 4 % (ref 3–12)
Neutro Abs: 10 K/uL — ABNORMAL HIGH (ref 1.7–7.7)
Neutrophils Relative %: 77 % (ref 43–77)
Platelets: 257 K/uL (ref 150–400)
RBC: 4.85 MIL/uL (ref 3.87–5.11)
RDW: 12.5 % (ref 11.5–15.5)
WBC: 13 K/uL — ABNORMAL HIGH (ref 4.0–10.5)

## 2013-04-12 LAB — COMPREHENSIVE METABOLIC PANEL
ALT: 20 U/L (ref 0–35)
AST: 14 U/L (ref 0–37)
Albumin: 3.5 g/dL (ref 3.5–5.2)
CO2: 25 mEq/L (ref 19–32)
Chloride: 105 mEq/L (ref 96–112)
GFR calc non Af Amer: 80 mL/min — ABNORMAL LOW (ref >90)
Sodium: 142 mEq/L (ref 135–145)
Total Bilirubin: 0.4 mg/dL (ref 0.3–1.2)

## 2013-04-12 LAB — URINALYSIS, ROUTINE W REFLEX MICROSCOPIC
Bilirubin Urine: NEGATIVE
Glucose, UA: NEGATIVE mg/dL
Hgb urine dipstick: NEGATIVE
Ketones, ur: NEGATIVE mg/dL
Leukocytes, UA: NEGATIVE
Nitrite: NEGATIVE
Protein, ur: NEGATIVE mg/dL
Specific Gravity, Urine: 1.021 (ref 1.005–1.030)
Urobilinogen, UA: 0.2 mg/dL (ref 0.0–1.0)
pH: 7.5 (ref 5.0–8.0)

## 2013-04-12 MED ORDER — MORPHINE SULFATE 4 MG/ML IJ SOLN
4.0000 mg | Freq: Once | INTRAMUSCULAR | Status: AC
  Administered 2013-04-12: 4 mg via INTRAVENOUS
  Filled 2013-04-12: qty 1

## 2013-04-12 MED ORDER — HYDROMORPHONE HCL PF 1 MG/ML IJ SOLN
1.0000 mg | Freq: Once | INTRAMUSCULAR | Status: DC
  Filled 2013-04-12: qty 1

## 2013-04-12 MED ORDER — SODIUM CHLORIDE 0.9 % IV SOLN
Freq: Once | INTRAVENOUS | Status: AC
  Administered 2013-04-12: 05:00:00 via INTRAVENOUS

## 2013-04-12 MED ORDER — LORAZEPAM 2 MG/ML IJ SOLN
2.0000 mg | Freq: Once | INTRAMUSCULAR | Status: AC
  Administered 2013-04-12: 2 mg via INTRAVENOUS
  Filled 2013-04-12: qty 1

## 2013-04-12 NOTE — ED Notes (Signed)
Per pt report: Pt began to have acute abd pain that began at 02:00.  Pt reports pain radiates to her lower back. Pt hx of gastroporesis but reports this feels different.  Pt endorses N and has vomited x 4.

## 2013-04-12 NOTE — ED Provider Notes (Signed)
CSN: [id number]     Arrival date & time 04/12/13  0348 History   First MD Initiated Contact with Patient 04/12/13 0411     Chief Complaint  Patient presents with  . Abdominal Pain   (Consider location/radiation/quality/duration/timing/severity/associated sxs/prior Treatment) HPI Comments: Patient presents with 2 hours of increased abdominal pain, bloating, cramping.  She, states she has a history of gastroparesis is in this is very similar to her exacerbations.  The only caveat is that.  Now.  She has low back pain, which is slightly different than her normal.  Presentation.  She, states, that she has been nauseated, and has vomited 4 times no diarrhea. She comes with a laminated card from her doctor with instructions for specific pain medications and antiemetics  Patient is a 57 y.o. female presenting with abdominal pain. The history is provided by the patient.  Abdominal Pain Pain location:  Generalized Pain quality: aching, bloating, cramping, fullness and throbbing   Pain severity:  Severe Onset quality:  Gradual Duration:  2 hours Timing:  Constant Progression:  Worsening Chronicity:  Recurrent Context: not eating, not laxative use and not retching   Relieved by:  None tried Worsened by:  Nothing tried Ineffective treatments:  None tried Associated symptoms: nausea and vomiting   Associated symptoms: no constipation, no cough, no diarrhea, no dysuria, no fever and no shortness of breath   Risk factors comment:  Patient has a history of gastroparesis   Past Medical History  Diagnosis Date  . Interstitial cystitis 1992  . Hyperlipidemia   . Gastroparesis    Past Surgical History  Procedure Laterality Date  . Abdominal hysterectomy  1998  . Fracture surgery  01/2011    R) foot  . Cesarean section    . Eus N/A 06/29/2012    Procedure: ESOPHAGEAL ENDOSCOPIC ULTRASOUND (EUS) RADIAL;  Surgeon: Willis Modena, MD;  Location: [facility];  Service: Endoscopy;  Laterality: N/A;    . Cholecystectomy     Family History  Problem Relation Age of Onset  . Heart disease Father   . Heart attack Father    History  Substance Use Topics  . Smoking status: Never Smoker   . Smokeless tobacco: Never Used  . Alcohol Use: No   OB History   Grav Para Term Preterm Abortions TAB SAB Ect Mult Living   2 2 2       2      Review of Systems  Constitutional: Negative for fever.  Respiratory: Negative for cough and shortness of breath.   Gastrointestinal: Positive for nausea, vomiting, abdominal pain and abdominal distention. Negative for diarrhea, constipation and blood in stool.  Genitourinary: Negative for dysuria.  Skin: Positive for pallor.  Neurological: Negative for dizziness and weakness.  All other systems reviewed and are negative.    Allergies  Aspirin; Iohexol; and Nsaids  Home Medications   Current Outpatient Rx  Name  Route  Sig  Dispense  Refill  . ALPRAZolam (XANAX) 0.5 MG tablet   Oral   Take 0.25-0.5 mg by mouth daily as needed for anxiety.          Marland Kitchen desvenlafaxine (PRISTIQ) 50 MG 24 hr tablet   Oral   Take 50 mg by mouth daily.         Marland Kitchen dronabinol (MARINOL) 2.5 MG capsule   Oral   Take 2.5 mg by mouth 2 (two) times daily as needed. For nausea         . HYDROcodone-acetaminophen (NORCO/VICODIN) 5-325 MG  per tablet   Oral   Take 1 tablet by mouth every 4 (four) hours as needed. For pain         . ondansetron (ZOFRAN) 4 MG tablet   Oral   Take 8 mg by mouth every 8 (eight) hours as needed. For nausea         . rosuvastatin (CRESTOR) 20 MG tablet   Oral   Take 1 tablet (20 mg total) by mouth at bedtime.   30 tablet   4   . triamterene-hydrochlorothiazide (MAXZIDE) 75-50 MG per tablet   Oral   Take 1 tablet by mouth daily as needed. For fluid in her ear.         . zolpidem (AMBIEN) 10 MG tablet   Oral   Take 10 mg by mouth at bedtime.          . promethazine (PHENERGAN) 25 MG tablet   Oral   Take 25 mg by mouth  every 6 (six) hours as needed for nausea.           BP 115/62  Pulse 97  Temp(Src) 97.3 F (36.3 C) (Oral)  Resp 17  Ht 5\' 3"  (1.6 m)  Wt 149 lb (67.586 kg)  BMI 26.40 kg/m2  SpO2 93% Physical Exam  Nursing note and vitals reviewed. Constitutional: She is oriented to person, place, and time. She appears well-developed and well-nourished.  HENT:  Head: Normocephalic.  Neck: Normal range of motion.  Cardiovascular: Regular rhythm.  Tachycardia present.   Pulmonary/Chest: Effort normal and breath sounds normal. No respiratory distress.  Abdominal: Soft. She exhibits distension. Bowel sounds are decreased. There is tenderness. There is no rebound and no guarding.  Musculoskeletal: Normal range of motion. She exhibits tenderness.       Back:  Neurological: She is alert and oriented to person, place, and time.  Skin: Skin is warm. She is diaphoretic. There is pallor.    ED Course  Procedures (including critical care time) Labs Review Labs Reviewed  CBC WITH DIFFERENTIAL - Abnormal; Notable for the following:    WBC 13.0 (*)    Neutro Abs 10.0 (*)    All other components within normal limits  COMPREHENSIVE METABOLIC PANEL - Abnormal; Notable for the following:    Potassium 3.4 (*)    Glucose, Bld 115 (*)    GFR calc non Af Amer 80 (*)    All other components within normal limits  URINALYSIS, ROUTINE W REFLEX MICROSCOPIC - Abnormal; Notable for the following:    APPearance CLOUDY (*)    All other components within normal limits  LIPASE, BLOOD   Imaging Review No results found.  EKG Interpretation    Date/Time:  Wednesday April 12 2013 04:09:28 EST Ventricular Rate:  67 PR Interval:  131 QRS Duration: 88 QT Interval:  408 QTC Calculation: 431 R Axis:   71 Text Interpretation:  Sinus rhythm Normal ECG Confirmed by OTTER  MD, OLGA ([id number]) on 04/12/2013 4:16:42 AM            MDM   1. Abdominal pain     5:43 AM.  Patient is feeling much better.  Nausea  has subsided.  She still having a small amount of pain in requesting additional pain medication    Arman Filter, NP 04/14/13 2000

## 2013-04-12 NOTE — ED Provider Notes (Signed)
  Physical Exam  BP 123/80  Pulse 98  Temp(Src) 97.3 F (36.3 C) (Oral)  Resp 18  Ht 5\' 3"  (1.6 m)  Wt 149 lb (67.586 kg)  BMI 26.40 kg/m2  SpO2 97%  Physical Exam  ED Course  Procedures  MDM: Hand off from Earley Favor NP-C. Labs WNL. Feeling better after meds. Able to tolerate po. Abd soft and nontender. Felt stable for d/c. Recommend f/u with PCP.      Simmie Davies, NP 04/12/13 [phone]

## 2013-04-12 NOTE — ED Provider Notes (Signed)
Medical screening examination/treatment/procedure(s) were performed by non-physician practitioner and as supervising physician I was immediately available for consultation/collaboration.  EKG Interpretation    Date/Time:  Wednesday April 12 2013 04:09:28 EST Ventricular Rate:  67 PR Interval:  131 QRS Duration: 88 QT Interval:  408 QTC Calculation: 431 R Axis:   71 Text Interpretation:  Sinus rhythm Normal ECG Confirmed by Nithin Demeo  MD, Akila Batta ([id number]) on 04/12/2013 4:16:42 AM             Olivia Mackie, MD 04/12/13 [phone]

## 2013-04-16 NOTE — ED Provider Notes (Signed)
Medical screening examination/treatment/procedure(s) were performed by non-physician practitioner and as supervising physician I was immediately available for consultation/collaboration.  EKG Interpretation    Date/Time:  Wednesday April 12 2013 04:09:28 EST Ventricular Rate:  67 PR Interval:  131 QRS Duration: 88 QT Interval:  408 QTC Calculation: 431 R Axis:   71 Text Interpretation:  Sinus rhythm Normal ECG Confirmed by Montia Haslip  MD, Glendale Wherry ([id number]) on 04/12/2013 4:16:42 AM             Olivia Mackie, MD 04/16/13 [phone]

## 2013-05-22 ENCOUNTER — Encounter (HOSPITAL_COMMUNITY): Payer: Self-pay | Admitting: *Deleted

## 2013-05-25 ENCOUNTER — Encounter (HOSPITAL_COMMUNITY): Payer: Self-pay | Admitting: Pharmacy Technician

## 2013-06-05 ENCOUNTER — Other Ambulatory Visit: Payer: Self-pay | Admitting: Gastroenterology

## 2013-06-05 NOTE — Addendum Note (Signed)
Addended by: Jalyiah Shelley on: 06/05/2013 01:35 PM   Modules accepted: Orders  

## 2013-06-20 ENCOUNTER — Encounter (HOSPITAL_COMMUNITY): Payer: Self-pay | Admitting: *Deleted

## 2013-06-29 ENCOUNTER — Encounter (HOSPITAL_COMMUNITY): Payer: Self-pay | Admitting: *Deleted

## 2013-07-04 ENCOUNTER — Encounter: Payer: Self-pay | Admitting: Internal Medicine

## 2013-07-04 ENCOUNTER — Ambulatory Visit (INDEPENDENT_AMBULATORY_CARE_PROVIDER_SITE_OTHER): Payer: BC Managed Care – PPO | Admitting: Internal Medicine

## 2013-07-04 VITALS — BP 138/96 | HR 100 | Temp 97.8°F | Resp 20 | Ht 62.0 in | Wt 159.2 lb

## 2013-07-04 DIAGNOSIS — R05 Cough: Secondary | ICD-10-CM

## 2013-07-04 DIAGNOSIS — R059 Cough, unspecified: Secondary | ICD-10-CM

## 2013-07-04 DIAGNOSIS — R0602 Shortness of breath: Secondary | ICD-10-CM

## 2013-07-04 DIAGNOSIS — J209 Acute bronchitis, unspecified: Secondary | ICD-10-CM

## 2013-07-04 MED ORDER — HYDROCODONE-ACETAMINOPHEN 7.5-325 MG/15ML PO SOLN: 5.0000 mL | Freq: Four times a day (QID) | ORAL | Status: DC | PRN

## 2013-07-04 MED ORDER — ALBUTEROL SULFATE (2.5 MG/3ML) 0.083% IN NEBU
2.5000 mg | INHALATION_SOLUTION | Freq: Once | RESPIRATORY_TRACT | Status: AC
Start: 2013-07-04 — End: 2013-07-04
  Administered 2013-07-04: 2.5 mg via RESPIRATORY_TRACT

## 2013-07-04 MED ORDER — IPRATROPIUM BROMIDE 0.02 % IN SOLN
0.5000 mg | Freq: Once | RESPIRATORY_TRACT | Status: AC
  Administered 2013-07-04: 0.5 mg via RESPIRATORY_TRACT

## 2013-07-04 NOTE — Patient Instructions (Signed)
Laryngitis At the top of your windpipe is your voice box. It is the source of your voice. Inside your voice box are 2 bands of muscles called vocal cords. When you breathe, your vocal cords are relaxed and open so that air can get into the lungs. When you decide to say something, these cords come together and vibrate. The sound from these vibrations goes into your throat and comes out through your mouth as sound. Laryngitis is an inflammation of the vocal cords that causes hoarseness, cough, loss of voice, sore throat, and dry throat. Laryngitis can be temporary (acute) or long-term (chronic). Most cases of acute laryngitis improve with time.Chronic laryngitis lasts for more than 3 weeks. CAUSES Laryngitis can often be related to excessive smoking, talking, or yelling, as well as inhalation of toxic fumes and allergies. Acute laryngitis is usually caused by a viral infection, vocal strain, measles or mumps, or bacterial infections. Chronic laryngitis is usually caused by vocal cord strain, vocal cord injury, postnasal drip, growths on the vocal cords, or acid reflux. SYMPTOMS   Cough.  Sore throat.  Dry throat. RISK FACTORS  Respiratory infections.  Exposure to irritating substances, such as cigarette smoke, excessive amounts of alcohol, stomach acids, and workplace chemicals.  Voice trauma, such as vocal cord injury from shouting or speaking too loud. DIAGNOSIS  Your cargiver will perform a physical exam. During the physical exam, your caregiver will examine your throat. The most common sign of laryngitis is hoarseness. Laryngoscopy may be necessary to confirm the diagnosis of this condition. This procedure allows your caregiver to look into the larynx. HOME CARE INSTRUCTIONS  Drink enough fluids to keep your urine clear or pale yellow.  Rest until you no longer have symptoms or as directed by your caregiver.  Breathe in moist air.  Take all medicine as directed by your  caregiver.  Do not smoke.  Talk as little as possible (this includes whispering).  Write on paper instead of talking until your voice is back to normal.  Follow up with your caregiver if your condition has not improved after 10 days. SEEK MEDICAL CARE IF:   You have trouble breathing.  You cough up blood.  You have persistent fever.  You have increasing pain.  You have difficulty swallowing. MAKE SURE YOU:  Understand these instructions.  Will watch your condition.  Will get help right away if you are not doing well or get worse. Document Released: 04/27/2005 Document Revised: 07/20/2011 Document Reviewed: 07/03/2010 [facility] Patient Information 2014 [place], [place]. Bronchospasm, Adult A bronchospasm is a spasm or tightening of the airways going into the lungs. During a bronchospasm breathing becomes more difficult because the airways get smaller. When this happens there can be coughing, a whistling sound when breathing (wheezing), and difficulty breathing. Bronchospasm is often associated with asthma, but not all patients who experience a bronchospasm have asthma. CAUSES  A bronchospasm is caused by inflammation or irritation of the airways. The inflammation or irritation may be triggered by:   Allergies (such as to animals, pollen, food, or mold). Allergens that cause bronchospasm may cause wheezing immediately after exposure or many hours later.   Infection. Viral infections are believed to be the most common cause of bronchospasm.   Exercise.   Irritants (such as pollution, cigarette smoke, strong odors, aerosol sprays, and paint fumes).   Weather changes. Winds increase molds and pollens in the air. Rain refreshes the air by washing irritants out. Cold air may cause inflammation.   Stress  and emotional upset.  SIGNS AND SYMPTOMS   Wheezing.   Excessive nighttime coughing.   Frequent or severe coughing with a simple cold.   Chest tightness.    Shortness of breath.  DIAGNOSIS  Bronchospasm is usually diagnosed through a history and physical exam. Tests, such as chest X-rays, are sometimes done to look for other conditions. TREATMENT   Inhaled medicines can be given to open up your airways and help you breathe. The medicines can be given using either an inhaler or a nebulizer machine.  Corticosteroid medicines may be given for severe bronchospasm, usually when it is associated with asthma. HOME CARE INSTRUCTIONS   Always have a plan prepared for seeking medical care. Know when to call your health care provider and local emergency services (911 in the [place] Know where you can access local emergency care.  Only take medicines as directed by your health care provider.  If you were prescribed an inhaler or nebulizer machine, ask your health care provider to explain how to use it correctly. Always use a spacer with your inhaler if you were given one.  It is necessary to remain calm during an attack. Try to relax and breathe more slowly.  Control your home environment in the following ways:   Change your heating and air conditioning filter at least once a month.   Limit your use of fireplaces and wood stoves.  Do not smoke and do not allow smoking in your home.   Avoid exposure to perfumes and fragrances.   Get rid of pests (such as roaches and mice) and their droppings.   Throw away plants if you see mold on them.   Keep your house clean and dust free.   Replace carpet with wood, tile, or vinyl flooring. Carpet can trap dander and dust.   Use allergy-proof pillows, mattress covers, and box spring covers.   Wash bed sheets and blankets every week in hot water and dry them in a dryer.   Use blankets that are made of polyester or cotton.   Wash hands frequently. SEEK MEDICAL CARE IF:   You have muscle aches.   You have chest pain.   The sputum changes from clear or white to yellow, green, gray,  or bloody.   The sputum you cough up gets thicker.   There are problems that may be related to the medicine you are given, such as a rash, itching, swelling, or trouble breathing.  SEEK IMMEDIATE MEDICAL CARE IF:   You have worsening wheezing and coughing even after taking your prescribed medicines.   You have increased difficulty breathing.   You develop severe chest pain. MAKE SURE YOU:   Understand these instructions.  Will watch your condition.  Will get help right away if you are not doing well or get worse. Document Released: 04/30/2003 Document Revised: 12/28/2012 Document Reviewed: 10/17/2012 [facility] Patient Information 2014 [place].

## 2013-07-04 NOTE — Progress Notes (Signed)
   Subjective:    Patient ID: Olivia Barker, female    DOB: 10-10-1955, 58 y.o.   MRN: [id number]  HPI presents today with SOB and cough. SOB started a couple days ago but has gotten worse. Does have wheezing and tightness in chest. Cough started about 3 days ago and it has progressively gotten worse. Cough/productive/green. Has had some chills no fever. Did have nasal congestion in the last 2 weeks but that has cleared up. No N/V. Has had occasional diarrhea. Finished zpak that was RX by allergist Dr. Leodis Liverpool on Saturday.  Today he also sent inhaler, prednisone, and avalox for her to pick up. She started all those this am. She has called her PCP and allergist office but both were closed today. Never had wheezes in her chest before, never used albuterol before or a nebulizer.     Review of Systems     Objective:   Physical Exam  Vitals reviewed. Constitutional: She is oriented to person, place, and time. She appears well-developed and well-nourished. No distress.  HENT:  Head: Normocephalic.  Right Ear: External ear normal.  Left Ear: External ear normal.  Nose: Mucosal edema and rhinorrhea present.  Mouth/Throat: Oropharynx is clear and moist.  Eyes: EOM are normal.  Neck: Normal range of motion. Neck supple. No thyromegaly present.  Cardiovascular: Normal rate, regular rhythm and normal heart sounds.   Pulmonary/Chest: Effort normal. No accessory muscle usage. Not tachypneic. She has no decreased breath sounds. She has wheezes. She has no rales.  Lymphadenopathy:    She has no cervical adenopathy.  Neurological: She is alert and oriented to person, place, and time. She exhibits normal muscle tone. Coordination normal.  Psychiatric: She has a normal mood and affect. Her behavior is normal. Thought content normal.    Neb with albuterol      Assessment & Plan:  Bronchitis/Tracheitis/Upper chest airways Use avelox/albuterol/Prednisone as ordered by your doctor Return if worse or no  improvement

## 2013-08-02 ENCOUNTER — Ambulatory Visit (HOSPITAL_COMMUNITY)
Admission: RE | Admit: 2013-08-02 | Payer: BC Managed Care – PPO | Source: Ambulatory Visit | Admitting: Gastroenterology

## 2013-08-02 SURGERY — ESOPHAGOGASTRODUODENOSCOPY (EGD) WITH PROPOFOL
Anesthesia: Monitor Anesthesia Care

## 2013-08-22 ENCOUNTER — Telehealth: Payer: Self-pay | Admitting: Internal Medicine

## 2013-08-22 NOTE — Telephone Encounter (Signed)
New Message  Pt requests a call back to determine if labs are needed to confirm that her Crestor is working. Please Call back to discuss.

## 2013-08-22 NOTE — Telephone Encounter (Signed)
Left message for pt, she does need to have repeat labs.  Orders are in the system and she can come by anytime to have those drawn. She will call with questions.

## 2013-09-18 ENCOUNTER — Other Ambulatory Visit: Payer: BC Managed Care – PPO

## 2013-09-18 ENCOUNTER — Telehealth: Payer: Self-pay

## 2013-09-18 DIAGNOSIS — E785 Hyperlipidemia, unspecified: Secondary | ICD-10-CM

## 2013-09-18 LAB — HEMOGLOBIN A1C: HEMOGLOBIN A1C: 5.8 % (ref 4.6–6.5)

## 2013-09-18 LAB — AST: AST: 20 U/L (ref 0–37)

## 2013-09-18 NOTE — Telephone Encounter (Signed)
Patient was here for an lab appointment ans asked for samples of crestor 20 mg took samples to front desk

## 2013-09-19 LAB — NMR LIPOPROFILE WITH LIPIDS
Cholesterol, Total: 158 mg/dL (ref <200)
HDL Particle Number: 26.4 umol/L — ABNORMAL LOW (ref >=30.5)
HDL Size: 8.8 nm — ABNORMAL LOW (ref >=9.2)
HDL-C: 40 mg/dL (ref >=40)
LARGE HDL: 3.9 umol/L — AB (ref >=4.8)
LARGE VLDL-P: 3.8 nmol/L — AB (ref <=2.7)
LDL (calc): 75 mg/dL (ref <100)
LDL Particle Number: 1303 nmol/L — ABNORMAL HIGH (ref <1000)
LDL Size: 20.1 nm — ABNORMAL LOW (ref >20.5)
LP-IR Score: 63 — ABNORMAL HIGH (ref <=45)
Small LDL Particle Number: 804 nmol/L — ABNORMAL HIGH (ref <=527)
TRIGLYCERIDES: 214 mg/dL — AB (ref <150)
VLDL Size: 46.2 nm (ref <=46.6)

## 2013-09-20 ENCOUNTER — Encounter (HOSPITAL_COMMUNITY): Payer: Self-pay | Admitting: Pharmacy Technician

## 2013-09-20 ENCOUNTER — Telehealth: Payer: Self-pay | Admitting: Internal Medicine

## 2013-09-20 NOTE — Telephone Encounter (Signed)
Lm to call back

## 2013-09-20 NOTE — Telephone Encounter (Signed)
Notified of lab results.  Waiting on lipomed profile results.

## 2013-09-20 NOTE — Telephone Encounter (Signed)
Follow up  Pt requests a call back to discuss lab results. Please call

## 2013-09-25 ENCOUNTER — Encounter (HOSPITAL_COMMUNITY): Payer: Self-pay | Admitting: *Deleted

## 2013-10-03 ENCOUNTER — Other Ambulatory Visit: Payer: Self-pay | Admitting: Gastroenterology

## 2013-10-03 NOTE — Addendum Note (Signed)
Addended by: Arta Silence on: 10/03/2013 10:56 AM   Modules accepted: Orders

## 2013-10-04 ENCOUNTER — Encounter (HOSPITAL_COMMUNITY): Payer: Self-pay | Admitting: *Deleted

## 2013-10-04 ENCOUNTER — Ambulatory Visit (HOSPITAL_COMMUNITY)
Admission: RE | Admit: 2013-10-04 | Discharge: 2013-10-04 | Disposition: A | Discharge status: Home / Self Care | Payer: BC Managed Care – PPO | Source: Ambulatory Visit | Attending: Gastroenterology | Admitting: Gastroenterology

## 2013-10-04 ENCOUNTER — Encounter (HOSPITAL_COMMUNITY): Payer: BC Managed Care – PPO | Admitting: Anesthesiology

## 2013-10-04 ENCOUNTER — Ambulatory Visit (HOSPITAL_COMMUNITY): Payer: BC Managed Care – PPO | Admitting: Anesthesiology

## 2013-10-04 ENCOUNTER — Encounter (HOSPITAL_COMMUNITY)
Admission: RE | Disposition: A | Discharge status: Home / Self Care | Payer: Self-pay | Source: Ambulatory Visit | Attending: Gastroenterology

## 2013-10-04 DIAGNOSIS — D130 Benign neoplasm of esophagus: Secondary | ICD-10-CM | POA: Insufficient documentation

## 2013-10-04 HISTORY — DX: Depression, unspecified: F32.A

## 2013-10-04 HISTORY — PX: EUS: SHX5427

## 2013-10-04 HISTORY — DX: Major depressive disorder, single episode, unspecified: F32.9

## 2013-10-04 SURGERY — ESOPHAGEAL ENDOSCOPIC ULTRASOUND (EUS) RADIAL
Anesthesia: Monitor Anesthesia Care

## 2013-10-04 MED ORDER — PROPOFOL 10 MG/ML IV BOLUS
INTRAVENOUS | Status: DC | PRN
  Administered 2013-10-04: 40 mg via INTRAVENOUS

## 2013-10-04 MED ORDER — ONDANSETRON HCL 4 MG/2ML IJ SOLN
INTRAMUSCULAR | Status: DC | PRN
  Administered 2013-10-04: 4 mg via INTRAVENOUS

## 2013-10-04 MED ORDER — LACTATED RINGERS IV SOLN
INTRAVENOUS | Status: DC
  Administered 2013-10-04: 1000 mL via INTRAVENOUS
  Administered 2013-10-04: 10:00:00 via INTRAVENOUS

## 2013-10-04 MED ORDER — PROPOFOL 10 MG/ML IV BOLUS
INTRAVENOUS | Status: AC
  Filled 2013-10-04: qty 20

## 2013-10-04 MED ORDER — KETAMINE HCL 10 MG/ML IJ SOLN
INTRAMUSCULAR | Status: DC | PRN
  Administered 2013-10-04: 20 mg via INTRAVENOUS

## 2013-10-04 MED ORDER — PROPOFOL INFUSION 10 MG/ML OPTIME
INTRAVENOUS | Status: DC | PRN
  Administered 2013-10-04: 140 ug/kg/min via INTRAVENOUS

## 2013-10-04 MED ORDER — SODIUM CHLORIDE 0.9 % IV SOLN: INTRAVENOUS | Status: DC

## 2013-10-04 NOTE — Transfer of Care (Signed)
Immediate Anesthesia Transfer of Care Note  Patient: Olivia Barker  Procedure(s) Performed: Procedure(s): ESOPHAGEAL ENDOSCOPIC ULTRASOUND (EUS) RADIAL (N/A)  Patient Location: PACU and Endoscopy Unit  Anesthesia Type:MAC  Level of Consciousness: awake and alert   Airway & Oxygen Therapy: Patient Spontanous Breathing and Patient connected to nasal cannula oxygen  Post-op Assessment: Report given to PACU RN and Post -op Vital signs reviewed and stable  Post vital signs: Reviewed and stable  Complications: No apparent anesthesia complications

## 2013-10-04 NOTE — H&P (Signed)
Patient interval history reviewed.  Patient examined again.  There has been no change from documented H/P dated 09/07/13 (scanned into chart from our office) except as documented above.  Assessment:  1.  Esophageal nodule.  Previously biopsied, consistent with spindle cell lesion (leiomyoma favored).  Plan:  1.  Endoscopic ultrasound for surveillance of esophageal lesion. 2.  Risks (bleeding, infection, bowel perforation that could require surgery, sedation-related changes in cardiopulmonary systems), benefits (identification and possible treatment of source of symptoms, exclusion of certain causes of symptoms), and alternatives (watchful waiting, radiographic imaging studies, empiric medical treatment) of upper endoscopy with ultrasound (EUS) were explained to patient/family in detail and patient wishes to proceed.

## 2013-10-04 NOTE — Op Note (Signed)
[facility] [place], [address]   ENDOSCOPIC ULTRASOUND PROCEDURE REPORT  PATIENT: Olivia Barker, Olivia Barker  MR#: [id number] BIRTHDATE: 1956-01-09  GENDER: Female ENDOSCOPIST: Arta Silence, MD REFERRED BY:  R.  Marcellus Scott, M.D. PROCEDURE DATE:  10/04/2013 PROCEDURE:   Upper EUS ASA CLASS:      Class II INDICATIONS:   1.  gastroesophageal nodule. MEDICATIONS: MAC sedation, administered by CRNA  DESCRIPTION OF PROCEDURE:   After the risks benefits and alternatives of the procedure were  explained, informed consent was obtained. The patient was then placed in the left, lateral, decubitus postion and IV sedation was administered. Throughout the procedure, the patients blood pressure, pulse and oxygen saturations were monitored continuously.  Under direct visualization, the radial forward-viewing echoendoscope was introduced through the mouth  and advanced to the second portion of the duodenum .  Water was used as necessary to provide an acoustic interface.  Upon completion of the imaging, water was removed and the patient was sent to the recovery room in satisfactory condition.    FINDINGS:      Submucosal-appearing nodule, with normal-appearing overlying mucosa, was again appreciated at the GE junction.  On EUS, the lesion appears to arise from the muscularis mucosa, and has normal-appearing submucosa and muscularis propria layers underneath.  The lesion is hypoechoic, well-defined, and measures about 23 mm x 10 mm in size, about the same size as was measured on exam in February 2014.  There was no perigastric adenopathy.  IMPRESSION:     As above.  Similar-appearing submucosal lesion at GE junction, FNA biopsies done one year ago, overall constellation of findings most consistent with leioymoma (GIST not completely excluded).  No interval change in size of lesion over the past one year.  RECOMMENDATIONS:     1.  Watch for potential complications  of procedure. 2.  Management options at this time include surgical referral for consideration of resection versus continued surveillance (EUS in 1-2 years). 3.  Office visit in 3 months to discuss matters #2 above.   _______________________________ eSigned:  Arta Silence, MD 10/04/2013 10:11 AM   CC:

## 2013-10-04 NOTE — Anesthesia Preprocedure Evaluation (Signed)
Anesthesia Evaluation  Patient identified by MRN, date of birth, ID band Patient awake    Reviewed: Allergy & Precautions, H&P , NPO status , Patient's Chart, lab work & pertinent test results  History of Anesthesia Complications (+) PONV  Airway Mallampati: II TM Distance: >3 FB Neck ROM: Full    Dental  (+) Teeth Intact, Dental Advisory Given   Pulmonary neg pulmonary ROS,  breath sounds clear to auscultation  Pulmonary exam normal       Cardiovascular negative cardio ROS  Rhythm:Regular Rate:Normal     Neuro/Psych negative neurological ROS  negative psych ROS   GI/Hepatic Neg liver ROS, GERD-  Medicated,  Endo/Other  negative endocrine ROS  Renal/GU negative Renal ROS   Interstitial cystitis negative genitourinary   Musculoskeletal negative musculoskeletal ROS (+)   Abdominal   Peds  Hematology negative hematology ROS (+)   Anesthesia Other Findings   Reproductive/Obstetrics negative OB ROS                           Anesthesia Physical Anesthesia Plan  ASA: I  Anesthesia Plan: MAC   Post-op Pain Management:    Induction: Intravenous  Airway Management Planned: Nasal Cannula  Additional Equipment:   Intra-op Plan:   Post-operative Plan: Extubation in OR  Informed Consent: I have reviewed the patients History and Physical, chart, labs and discussed the procedure including the risks, benefits and alternatives for the proposed anesthesia with the patient or authorized representative who has indicated his/her understanding and acceptance.   Dental advisory given  Plan Discussed with: CRNA  Anesthesia Plan Comments:         Anesthesia Quick Evaluation

## 2013-10-04 NOTE — Anesthesia Postprocedure Evaluation (Signed)
Anesthesia Post Note  Patient: Olivia Barker  Procedure(s) Performed: Procedure(s) (LRB): ESOPHAGEAL ENDOSCOPIC ULTRASOUND (EUS) RADIAL (N/A)  Anesthesia type: MAC  Patient location: PACU  Post pain: Pain level controlled  Post assessment: Post-op Vital signs reviewed  Last Vitals:  Filed Vitals:   10/04/13 1007  BP: 116/62  Pulse: 89  Temp: 36.9 C  Resp: 12    Post vital signs: Reviewed  Level of consciousness: sedated  Complications: No apparent anesthesia complications

## 2013-10-04 NOTE — Discharge Instructions (Signed)
Endoscopic Ultrasound ° °Care After °Please read the instructions outlined below and refer to this sheet in the next few weeks. These discharge instructions provide you with general information on caring for yourself after you leave the hospital. Your doctor may also give you specific instructions. While your treatment has been planned according to the most current medical practices available, unavoidable complications occasionally occur. If you have any problems or questions after discharge, please call Dr. Shatiqua Heroux ([facility]) at [phone]. ° °HOME CARE INSTRUCTIONS °Activity °· You may resume your regular activity but move at a slower pace for the next 24 hours.  °· Take frequent rest periods for the next 24 hours.  °· Walking will help expel (get rid of) the air and reduce the bloated feeling in your abdomen.  °· No driving for 24 hours (because of the anesthesia (medicine) used during the test).  °· You may shower.  °· Do not sign any important legal documents or operate any machinery for 24 hours (because of the anesthesia used during the test).  °Nutrition °· Drink plenty of fluids.  °· You may resume your normal diet.  °· Begin with a light meal and progress to your normal diet.  °· Avoid alcoholic beverages for 24 hours or as instructed by your caregiver.  °Medications °You may resume your normal medications unless your caregiver tells you otherwise. °What you can expect today °· You may experience abdominal discomfort such as a feeling of fullness or "gas" pains.  °· You may experience a sore throat for 2 to 3 days. This is normal. Gargling with salt water may help this.  °·  °SEEK IMMEDIATE MEDICAL CARE IF: °· You have excessive nausea (feeling sick to your stomach) and/or vomiting.  °· You have severe abdominal pain and distention (swelling).  °· You have trouble swallowing.  °· You have a temperature over 100° F (37.8° C).  °· You have rectal bleeding or vomiting of blood.  °Document  Released: 12/10/2003 Document Revised: 01/07/2011 Document Reviewed: 06/22/2007 °ExitCare® Patient Information ©2012 [employer]. °

## 2013-10-05 ENCOUNTER — Encounter (HOSPITAL_COMMUNITY): Payer: Self-pay | Admitting: Gastroenterology

## 2014-01-14 ENCOUNTER — Encounter (HOSPITAL_COMMUNITY): Payer: Self-pay | Admitting: Emergency Medicine

## 2014-01-14 ENCOUNTER — Emergency Department (HOSPITAL_COMMUNITY): Payer: BC Managed Care – PPO

## 2014-01-14 ENCOUNTER — Inpatient Hospital Stay (HOSPITAL_COMMUNITY)
Admission: EM | Admit: 2014-01-14 | Discharge: 2014-01-17 | DRG: 871 | Disposition: A | Discharge status: Home / Self Care | Payer: BC Managed Care – PPO | Attending: Internal Medicine | Admitting: Internal Medicine

## 2014-01-14 ENCOUNTER — Ambulatory Visit (INDEPENDENT_AMBULATORY_CARE_PROVIDER_SITE_OTHER): Payer: BC Managed Care – PPO | Admitting: Emergency Medicine

## 2014-01-14 VITALS — BP 140/90 | HR 140 | Temp 102.7°F | Resp 20 | Ht 62.0 in | Wt 154.2 lb

## 2014-01-14 DIAGNOSIS — K3184 Gastroparesis: Secondary | ICD-10-CM | POA: Diagnosis present

## 2014-01-14 DIAGNOSIS — A4151 Sepsis due to Escherichia coli [E. coli]: Secondary | ICD-10-CM | POA: Diagnosis present

## 2014-01-14 DIAGNOSIS — J189 Pneumonia, unspecified organism: Secondary | ICD-10-CM | POA: Diagnosis present

## 2014-01-14 DIAGNOSIS — Z6829 Body mass index (BMI) 29.0-29.9, adult: Secondary | ICD-10-CM

## 2014-01-14 DIAGNOSIS — R82998 Other abnormal findings in urine: Secondary | ICD-10-CM

## 2014-01-14 DIAGNOSIS — J9600 Acute respiratory failure, unspecified whether with hypoxia or hypercapnia: Secondary | ICD-10-CM | POA: Diagnosis present

## 2014-01-14 DIAGNOSIS — F329 Major depressive disorder, single episode, unspecified: Secondary | ICD-10-CM | POA: Diagnosis present

## 2014-01-14 DIAGNOSIS — A419 Sepsis, unspecified organism: Secondary | ICD-10-CM | POA: Diagnosis present

## 2014-01-14 DIAGNOSIS — N3010 Interstitial cystitis (chronic) without hematuria: Secondary | ICD-10-CM | POA: Diagnosis present

## 2014-01-14 DIAGNOSIS — F411 Generalized anxiety disorder: Secondary | ICD-10-CM | POA: Diagnosis present

## 2014-01-14 DIAGNOSIS — E876 Hypokalemia: Secondary | ICD-10-CM | POA: Diagnosis present

## 2014-01-14 DIAGNOSIS — R6520 Severe sepsis without septic shock: Secondary | ICD-10-CM | POA: Diagnosis present

## 2014-01-14 DIAGNOSIS — D649 Anemia, unspecified: Secondary | ICD-10-CM | POA: Diagnosis present

## 2014-01-14 DIAGNOSIS — R000 Tachycardia, unspecified: Secondary | ICD-10-CM | POA: Diagnosis present

## 2014-01-14 DIAGNOSIS — F3289 Other specified depressive episodes: Secondary | ICD-10-CM | POA: Diagnosis present

## 2014-01-14 DIAGNOSIS — Z79899 Other long term (current) drug therapy: Secondary | ICD-10-CM

## 2014-01-14 DIAGNOSIS — E785 Hyperlipidemia, unspecified: Secondary | ICD-10-CM

## 2014-01-14 DIAGNOSIS — I517 Cardiomegaly: Secondary | ICD-10-CM | POA: Diagnosis present

## 2014-01-14 DIAGNOSIS — M545 Low back pain, unspecified: Secondary | ICD-10-CM

## 2014-01-14 DIAGNOSIS — E663 Overweight: Secondary | ICD-10-CM | POA: Diagnosis present

## 2014-01-14 DIAGNOSIS — N10 Acute tubulo-interstitial nephritis: Secondary | ICD-10-CM

## 2014-01-14 DIAGNOSIS — R8281 Pyuria: Secondary | ICD-10-CM

## 2014-01-14 DIAGNOSIS — R350 Frequency of micturition: Secondary | ICD-10-CM

## 2014-01-14 DIAGNOSIS — Z87442 Personal history of urinary calculi: Secondary | ICD-10-CM

## 2014-01-14 DIAGNOSIS — R509 Fever, unspecified: Secondary | ICD-10-CM

## 2014-01-14 DIAGNOSIS — N12 Tubulo-interstitial nephritis, not specified as acute or chronic: Secondary | ICD-10-CM | POA: Diagnosis present

## 2014-01-14 DIAGNOSIS — D696 Thrombocytopenia, unspecified: Secondary | ICD-10-CM | POA: Diagnosis present

## 2014-01-14 DIAGNOSIS — R791 Abnormal coagulation profile: Secondary | ICD-10-CM

## 2014-01-14 DIAGNOSIS — J9601 Acute respiratory failure with hypoxia: Secondary | ICD-10-CM | POA: Diagnosis present

## 2014-01-14 DIAGNOSIS — K219 Gastro-esophageal reflux disease without esophagitis: Secondary | ICD-10-CM

## 2014-01-14 DIAGNOSIS — Z8719 Personal history of other diseases of the digestive system: Secondary | ICD-10-CM

## 2014-01-14 DIAGNOSIS — F32A Depression, unspecified: Secondary | ICD-10-CM | POA: Diagnosis present

## 2014-01-14 LAB — POCT URINALYSIS DIPSTICK
BILIRUBIN UA: NEGATIVE
GLUCOSE UA: NEGATIVE
KETONES UA: NEGATIVE
NITRITE UA: POSITIVE
Protein, UA: 100
SPEC GRAV UA: 1.01
Urobilinogen, UA: 0.2
pH, UA: 5.5

## 2014-01-14 LAB — CBC WITH DIFFERENTIAL/PLATELET
Basophils Absolute: 0 10*3/uL (ref 0.0–0.1)
Basophils Relative: 0 % (ref 0–1)
EOS ABS: 0 10*3/uL (ref 0.0–0.7)
EOS PCT: 0 % (ref 0–5)
HCT: 39.9 % (ref 36.0–46.0)
HEMOGLOBIN: 13.1 g/dL (ref 12.0–15.0)
Lymphocytes Relative: 4 % — ABNORMAL LOW (ref 12–46)
Lymphs Abs: 0.5 10*3/uL — ABNORMAL LOW (ref 0.7–4.0)
MCH: 29.7 pg (ref 26.0–34.0)
MCHC: 32.8 g/dL (ref 30.0–36.0)
MCV: 90.5 fL (ref 78.0–100.0)
MONOS PCT: 7 % (ref 3–12)
Monocytes Absolute: 0.9 10*3/uL (ref 0.1–1.0)
Neutro Abs: 11.1 10*3/uL — ABNORMAL HIGH (ref 1.7–7.7)
Neutrophils Relative %: 89 % — ABNORMAL HIGH (ref 43–77)
PLATELETS: 208 10*3/uL (ref 150–400)
RBC: 4.41 MIL/uL (ref 3.87–5.11)
RDW: 12.6 % (ref 11.5–15.5)
WBC: 12.6 10*3/uL — ABNORMAL HIGH (ref 4.0–10.5)

## 2014-01-14 LAB — URINALYSIS, ROUTINE W REFLEX MICROSCOPIC
Glucose, UA: NEGATIVE mg/dL
Ketones, ur: NEGATIVE mg/dL
Nitrite: POSITIVE — AB
Protein, ur: 100 mg/dL — AB
SPECIFIC GRAVITY, URINE: 1.023 (ref 1.005–1.030)
UROBILINOGEN UA: 1 mg/dL (ref 0.0–1.0)
pH: 5.5 (ref 5.0–8.0)

## 2014-01-14 LAB — COMPREHENSIVE METABOLIC PANEL
ALT: 15 U/L (ref 0–35)
AST: 13 U/L (ref 0–37)
Albumin: 3.3 g/dL — ABNORMAL LOW (ref 3.5–5.2)
Alkaline Phosphatase: 62 U/L (ref 39–117)
Anion gap: 15 (ref 5–15)
BUN: 11 mg/dL (ref 6–23)
CALCIUM: 8.8 mg/dL (ref 8.4–10.5)
CO2: 22 mEq/L (ref 19–32)
Chloride: 100 mEq/L (ref 96–112)
Creatinine, Ser: 1.07 mg/dL (ref 0.50–1.10)
GFR calc non Af Amer: 57 mL/min — ABNORMAL LOW (ref >90)
GFR, EST AFRICAN AMERICAN: 65 mL/min — AB (ref >90)
GLUCOSE: 111 mg/dL — AB (ref 70–99)
Potassium: 3.3 mEq/L — ABNORMAL LOW (ref 3.7–5.3)
Sodium: 137 mEq/L (ref 137–147)
TOTAL PROTEIN: 6.9 g/dL (ref 6.0–8.3)
Total Bilirubin: 0.9 mg/dL (ref 0.3–1.2)

## 2014-01-14 LAB — POCT UA - MICROSCOPIC ONLY
Casts, Ur, LPF, POC: NEGATIVE
Crystals, Ur, HPF, POC: NEGATIVE
YEAST UA: NEGATIVE

## 2014-01-14 LAB — MRSA PCR SCREENING: MRSA BY PCR: NEGATIVE

## 2014-01-14 LAB — PRO B NATRIURETIC PEPTIDE: Pro B Natriuretic peptide (BNP): 463.7 pg/mL — ABNORMAL HIGH (ref 0–125)

## 2014-01-14 LAB — URINE MICROSCOPIC-ADD ON

## 2014-01-14 LAB — I-STAT CG4 LACTIC ACID, ED: Lactic Acid, Venous: 2.68 mmol/L — ABNORMAL HIGH (ref 0.5–2.2)

## 2014-01-14 LAB — POCT CBC
Granulocyte percent: 91.8 %G — AB (ref 37–80)
HEMATOCRIT: 39.2 % (ref 37.7–47.9)
Hemoglobin: 12.9 g/dL (ref 12.2–16.2)
LYMPH, POC: 0.7 (ref 0.6–3.4)
MCH, POC: 30.1 pg (ref 27–31.2)
MCHC: 33 g/dL (ref 31.8–35.4)
MCV: 91.3 fL (ref 80–97)
MID (CBC): 0.3 (ref 0–0.9)
MPV: 7 fL (ref 0–99.8)
PLATELET COUNT, POC: 208 10*3/uL (ref 142–424)
POC GRANULOCYTE: 10.5 — AB (ref 2–6.9)
POC LYMPH PERCENT: 5.9 %L — AB (ref 10–50)
POC MID %: 2.3 %M (ref 0–12)
RBC: 4.29 M/uL (ref 4.04–5.48)
RDW, POC: 13.2 %
WBC: 11.4 10*3/uL — AB (ref 4.6–10.2)

## 2014-01-14 LAB — D-DIMER, QUANTITATIVE (NOT AT ARMC): D DIMER QUANT: 1.15 ug{FEU}/mL — AB (ref 0.00–0.48)

## 2014-01-14 MED ORDER — METOCLOPRAMIDE HCL 5 MG/ML IJ SOLN
10.0000 mg | Freq: Once | INTRAMUSCULAR | Status: DC
  Filled 2014-01-14: qty 2

## 2014-01-14 MED ORDER — SODIUM CHLORIDE 0.9 % IV BOLUS (SEPSIS)
1000.0000 mL | Freq: Once | INTRAVENOUS | Status: AC
  Administered 2014-01-14: 1000 mL via INTRAVENOUS

## 2014-01-14 MED ORDER — ALPRAZOLAM 0.25 MG PO TABS
0.2500 mg | ORAL_TABLET | Freq: Every day | ORAL | Status: DC | PRN
  Administered 2014-01-15: 0.5 mg via ORAL
  Administered 2014-01-15 – 2014-01-16 (×2): 0.25 mg via ORAL
  Filled 2014-01-14: qty 2
  Filled 2014-01-14: qty 1
  Filled 2014-01-14: qty 2

## 2014-01-14 MED ORDER — ENOXAPARIN SODIUM 80 MG/0.8ML ~~LOC~~ SOLN
70.0000 mg | Freq: Once | SUBCUTANEOUS | Status: AC
  Administered 2014-01-14: 70 mg via SUBCUTANEOUS
  Filled 2014-01-14: qty 0.8

## 2014-01-14 MED ORDER — SODIUM CHLORIDE 0.9 % IV BOLUS (SEPSIS)
500.0000 mL | Freq: Once | INTRAVENOUS | Status: AC
  Administered 2014-01-14: 500 mL via INTRAVENOUS

## 2014-01-14 MED ORDER — FENTANYL CITRATE 0.05 MG/ML IJ SOLN
50.0000 ug | Freq: Once | INTRAMUSCULAR | Status: AC
  Administered 2014-01-14: 50 ug via INTRAVENOUS
  Filled 2014-01-14: qty 2

## 2014-01-14 MED ORDER — SODIUM CHLORIDE 0.9 % IV SOLN
INTRAVENOUS | Status: DC
  Administered 2014-01-14: 23:00:00 via INTRAVENOUS
  Administered 2014-01-15: 1000 mL via INTRAVENOUS

## 2014-01-14 MED ORDER — BUPROPION HCL ER (XL) 150 MG PO TB24
150.0000 mg | ORAL_TABLET | Freq: Every morning | ORAL | Status: DC
  Administered 2014-01-15 – 2014-01-16 (×2): 150 mg via ORAL
  Filled 2014-01-14 (×3): qty 1

## 2014-01-14 MED ORDER — DEXTROSE 5 % IV SOLN
500.0000 mg | INTRAVENOUS | Status: DC
  Administered 2014-01-15: 500 mg via INTRAVENOUS
  Filled 2014-01-14: qty 500

## 2014-01-14 MED ORDER — ONDANSETRON HCL 4 MG/2ML IJ SOLN
4.0000 mg | Freq: Once | INTRAMUSCULAR | Status: AC
  Administered 2014-01-14: 4 mg via INTRAVENOUS
  Filled 2014-01-14: qty 2

## 2014-01-14 MED ORDER — DEXTROSE 5 % IV SOLN
1.0000 g | INTRAVENOUS | Status: DC
  Administered 2014-01-15 – 2014-01-16 (×2): 1 g via INTRAVENOUS
  Filled 2014-01-14 (×3): qty 10

## 2014-01-14 MED ORDER — DEXTROSE 5 % IV SOLN
500.0000 mg | Freq: Once | INTRAVENOUS | Status: AC
  Administered 2014-01-14: 500 mg via INTRAVENOUS
  Filled 2014-01-14: qty 500

## 2014-01-14 MED ORDER — ACETAMINOPHEN 325 MG PO TABS
650.0000 mg | ORAL_TABLET | Freq: Once | ORAL | Status: AC
  Administered 2014-01-14: 650 mg via ORAL
  Filled 2014-01-14: qty 2

## 2014-01-14 MED ORDER — ENOXAPARIN SODIUM 80 MG/0.8ML ~~LOC~~ SOLN
70.0000 mg | Freq: Two times a day (BID) | SUBCUTANEOUS | Status: DC
  Administered 2014-01-15 – 2014-01-16 (×3): 70 mg via SUBCUTANEOUS
  Filled 2014-01-14 (×5): qty 0.8

## 2014-01-14 MED ORDER — MORPHINE SULFATE 2 MG/ML IJ SOLN
2.0000 mg | INTRAMUSCULAR | Status: DC | PRN
  Administered 2014-01-14 – 2014-01-15 (×2): 2 mg via INTRAVENOUS
  Filled 2014-01-14 (×2): qty 1

## 2014-01-14 MED ORDER — ZOLPIDEM TARTRATE 5 MG PO TABS
5.0000 mg | ORAL_TABLET | Freq: Every evening | ORAL | Status: DC | PRN
  Administered 2014-01-15 – 2014-01-16 (×3): 5 mg via ORAL
  Filled 2014-01-14 (×3): qty 1

## 2014-01-14 MED ORDER — ENOXAPARIN SODIUM 40 MG/0.4ML ~~LOC~~ SOLN: 40.0000 mg | SUBCUTANEOUS | Status: DC

## 2014-01-14 MED ORDER — DEXTROSE 5 % IV SOLN
1.0000 g | Freq: Once | INTRAVENOUS | Status: AC
  Administered 2014-01-14: 1 g via INTRAVENOUS
  Filled 2014-01-14: qty 10

## 2014-01-14 MED ORDER — ONDANSETRON HCL 4 MG/2ML IJ SOLN: 4.0000 mg | Freq: Once | INTRAMUSCULAR | Status: DC

## 2014-01-14 MED ORDER — ACETAMINOPHEN 325 MG PO TABS: 650.0000 mg | ORAL_TABLET | Freq: Four times a day (QID) | ORAL | Status: DC | PRN

## 2014-01-14 MED ORDER — ONDANSETRON HCL 4 MG/2ML IJ SOLN
4.0000 mg | Freq: Three times a day (TID) | INTRAMUSCULAR | Status: AC | PRN
  Administered 2014-01-14: 4 mg via INTRAVENOUS
  Filled 2014-01-14 (×2): qty 2

## 2014-01-14 MED ORDER — SODIUM CHLORIDE 0.9 % IV SOLN
INTRAVENOUS | Status: AC
  Administered 2014-01-14: 17:00:00 via INTRAVENOUS

## 2014-01-14 MED ORDER — MORPHINE SULFATE 4 MG/ML IJ SOLN
4.0000 mg | Freq: Once | INTRAMUSCULAR | Status: AC
  Administered 2014-01-14: 4 mg via INTRAVENOUS
  Filled 2014-01-14: qty 1

## 2014-01-14 MED ORDER — ATORVASTATIN CALCIUM 40 MG PO TABS
40.0000 mg | ORAL_TABLET | Freq: Every day | ORAL | Status: DC
  Administered 2014-01-15 – 2014-01-16 (×2): 40 mg via ORAL
  Filled 2014-01-14 (×4): qty 1

## 2014-01-14 MED ORDER — FLUOXETINE HCL 20 MG PO TABS: 20.0000 mg | ORAL_TABLET | Freq: Every morning | ORAL | Status: DC

## 2014-01-14 NOTE — ED Notes (Signed)
MD Belfi made aware of hypotension and recommends a 554mL NS Bolus.

## 2014-01-14 NOTE — Progress Notes (Signed)
Blood pressure still soft.  No other symptoms noted.  Tom NP aware with orders received for NS bolus. Continue to monitor.

## 2014-01-14 NOTE — Progress Notes (Signed)
Subjective:    Patient ID: Olivia Barker, female    DOB: [birth year], 58 y.o.   MRN: [id number]  HPI Pt states she started feeling nauseous yesterday evening. She also states she was febrile last night, with a temperature as high as 104 last night. She notes shaking chills associated with fever.  Husband states she had an irregular and rapid heart rate on Friday. She is unsure whether she had a fever at that time. She denies any respiratory symptoms. She also notes increased frequency in urination.  She is also complaining of low to mid back pain that is more severe on the left side.   Pt denies past medical history of kidney issues. She states she does have a history of gastroparesis.    Review of Systems     Objective:   Physical Exam  Constitutional: She appears well-developed and well-nourished.  HENT:  Head: Normocephalic.  Right Ear: External ear normal.  Left Ear: External ear normal.  Eyes: Pupils are equal, round, and reactive to light.  Neck: Neck supple.  Cardiovascular:  Patient has a very rapid rate but no murmur.  Pulmonary/Chest: Effort normal and breath sounds normal.  Abdominal:  The patient has tenderness in the right flank. There is also tenderness in the right upper abdomen. There is no rebound noted   Results for orders placed in visit on 01/14/14  POCT UA - MICROSCOPIC ONLY      Result Value Ref Range   WBC, Ur, HPF, POC 20-30     RBC, urine, microscopic 2-4     Bacteria, U Microscopic 3+     Mucus, UA trace     Epithelial cells, urine per micros 2-4     Crystals, Ur, HPF, POC neg     Casts, Ur, LPF, POC neg     Yeast, UA neg    POCT URINALYSIS DIPSTICK      Result Value Ref Range   Color, UA yellow     Clarity, UA cloudy     Glucose, UA neg     Bilirubin, UA neg     Ketones, UA neg     Spec Grav, UA 1.010     Blood, UA moderate     pH, UA 5.5     Protein, UA 100     Urobilinogen, UA 0.2     Nitrite, UA positive     Leukocytes, UA large  (3+)     Results for orders placed in visit on 01/14/14  POCT UA - MICROSCOPIC ONLY      Result Value Ref Range   WBC, Ur, HPF, POC 20-30     RBC, urine, microscopic 2-4     Bacteria, U Microscopic 3+     Mucus, UA trace     Epithelial cells, urine per micros 2-4     Crystals, Ur, HPF, POC neg     Casts, Ur, LPF, POC neg     Yeast, UA neg    POCT URINALYSIS DIPSTICK      Result Value Ref Range   Color, UA yellow     Clarity, UA cloudy     Glucose, UA neg     Bilirubin, UA neg     Ketones, UA neg     Spec Grav, UA 1.010     Blood, UA moderate     pH, UA 5.5     Protein, UA 100     Urobilinogen, UA 0.2     Nitrite, UA  positive     Leukocytes, UA large (3+)    POCT CBC      Result Value Ref Range   WBC 11.4 (*) 4.6 - 10.2 K/uL   Lymph, poc 0.7  0.6 - 3.4   POC LYMPH PERCENT 5.9 (*) 10 - 50 %L   MID (cbc) 0.3  0 - 0.9   POC MID % 2.3  0 - 12 %M   POC Granulocyte 10.5 (*) 2 - 6.9   Granulocyte percent 91.8 (*) 37 - 80 %G   RBC 4.29  4.04 - 5.48 M/uL   Hemoglobin 12.9  12.2 - 16.2 g/dL   HCT, POC 39.2  37.7 - 47.9 %   MCV 91.3  80 - 97 fL   MCH, POC 30.1  27 - 31.2 pg   MCHC 33.0  31.8 - 35.4 g/dL   RDW, POC 13.2     Platelet Count, POC 208  142 - 424 K/uL   MPV 7.0  0 - 99.8 fL        Assessment & Plan:  Patient here with acute pyelonephritis with possible bacteremia she will be sent to the emergency room for further evaluation and management and treat. She does have a urologist Dr.Wrenn. [facility] department triage nurse notified . Blood cultures were not done here and can be done through the emergency room.

## 2014-01-14 NOTE — ED Notes (Signed)
MD Belfi at bedside. 

## 2014-01-14 NOTE — ED Notes (Signed)
Pt transported to XRAY °

## 2014-01-14 NOTE — Progress Notes (Signed)
Brief Progress note: abx  On Ceftriaxone and Azithromycin which are not renally adjusted  No changes necessary, will follow clinical course  Thank you,  Minda Ditto PharmD Pager [phone] 01/14/2014, 7:00 PM

## 2014-01-14 NOTE — ED Notes (Signed)
US at bedside

## 2014-01-14 NOTE — ED Notes (Signed)
Patient had episode of sudden shortness of breath, neck red, and tachycardia of 140. MD Belfi brought to bedside. Pain meds ordered.

## 2014-01-14 NOTE — ED Provider Notes (Signed)
CSN: [id number]     Arrival date & time 01/14/14  1049 History   First MD Initiated Contact with Patient 01/14/14 1108     Chief Complaint  Patient presents with  . Pyelonephritis     (Consider location/radiation/quality/duration/timing/severity/associated sxs/prior Treatment) HPI Comments: Patient presents with possible pyelonephritis. She's been having a two-day history of some pain across her lower back. Yesterday she was having some burning on urination and started running fevers. Her highest fever was 104 this morning. She's had some nausea but no vomiting. She's says that she still has some crampy pain across her low back. She denies any abdominal pain. She was seen by [place] urgent care and was diagnosed with a UTI with possible pyelonephritis. She was sent over here for further treatment. She has had a history of recurrent UTIs.   Past Medical History  Diagnosis Date  . Interstitial cystitis 1992  . Hyperlipidemia   . Gastroparesis   . PONV (postoperative nausea and vomiting)   . Anxiety   . Depression    Past Surgical History  Procedure Laterality Date  . Cesarean section  06-12-1986  . Eus N/A 06/29/2012    Procedure: ESOPHAGEAL ENDOSCOPIC ULTRASOUND (EUS) RADIAL;  Surgeon: Arta Silence, MD;  Location: [facility];  Service: Endoscopy;  Laterality: N/A;  . Fracture surgery  01/2011    R) foot  . Abdominal hysterectomy  1998    partial  . Cholecystectomy  apr 2014  . Eus N/A 10/04/2013    Procedure: ESOPHAGEAL ENDOSCOPIC ULTRASOUND (EUS) RADIAL;  Surgeon: Arta Silence, MD;  Location: [facility];  Service: Endoscopy;  Laterality: N/A;   Family History  Problem Relation Age of Onset  . Heart disease Father   . Heart attack Father    History  Substance Use Topics  . Smoking status: Never Smoker   . Smokeless tobacco: Never Used  . Alcohol Use: No   OB History   Grav Para Term Preterm Abortions TAB SAB Ect Mult Living   2 2 2       2      Review of Systems   Constitutional: Positive for fever, chills and fatigue. Negative for diaphoresis.  HENT: Negative for congestion, rhinorrhea and sneezing.   Eyes: Negative.   Respiratory: Negative for cough, chest tightness and shortness of breath.   Cardiovascular: Negative for chest pain and leg swelling.  Gastrointestinal: Positive for nausea. Negative for vomiting, abdominal pain, diarrhea and blood in stool.  Genitourinary: Negative for frequency, hematuria, flank pain and difficulty urinating.  Musculoskeletal: Positive for back pain. Negative for arthralgias.  Skin: Negative for rash.  Neurological: Negative for dizziness, speech difficulty, weakness, numbness and headaches.      Allergies  Aspirin; Iohexol; and Nsaids  Home Medications   Prior to Admission medications   Medication Sig Start Date End Date Taking? Authorizing Provider  acetaminophen (TYLENOL) 500 MG tablet Take 1,000 mg by mouth every 6 (six) hours as needed for mild pain.   Yes Historical Provider, MD  ALPRAZolam Duanne Moron) 0.5 MG tablet Take 0.25-0.5 mg by mouth daily as needed for anxiety.  12/21/11  Yes Historical Provider, MD  buPROPion (WELLBUTRIN XL) 150 MG 24 hr tablet Take 150 mg by mouth every morning.   Yes Historical Provider, MD  dronabinol (MARINOL) 2.5 MG capsule Take 2.5 mg by mouth 2 (two) times daily as needed. For nausea   Yes Historical Provider, MD  FLUoxetine (PROZAC) 20 MG tablet Take 20 mg by mouth every morning.  Yes Historical Provider, MD  HYDROcodone-acetaminophen (NORCO/VICODIN) 5-325 MG per tablet Take 1 tablet by mouth every 4 (four) hours as needed for moderate pain. For pain 12/28/11  Yes Historical Provider, MD  NON FORMULARY Apply 1 application topically every 6 (six) hours as needed (for nausea).   Yes Historical Provider, MD  prochlorperazine (COMPAZINE) 10 MG tablet Take 10 mg by mouth every 6 (six) hours as needed for nausea or vomiting.   Yes Historical Provider, MD  rosuvastatin (CRESTOR) 20  MG tablet Take 20 mg by mouth daily.   Yes Historical Provider, MD  triamterene-hydrochlorothiazide (MAXZIDE) 75-50 MG per tablet Take 1 tablet by mouth daily as needed (for fluid in her ear).  01/04/12  Yes Historical Provider, MD  zolpidem (AMBIEN) 10 MG tablet Take 10 mg by mouth at bedtime as needed for sleep.    Yes Historical Provider, MD   BP 88/52  Pulse 117  Temp(Src) 101.1 F (38.4 C) (Oral)  Resp 24  SpO2 97% Physical Exam  Constitutional: She is oriented to person, place, and time. She appears well-developed and well-nourished.  HENT:  Head: Normocephalic and atraumatic.  Eyes: Pupils are equal, round, and reactive to light.  Neck: Normal range of motion. Neck supple.  Cardiovascular: Normal rate, regular rhythm and normal heart sounds.   Pulmonary/Chest: Effort normal and breath sounds normal. No respiratory distress. She has no wheezes. She has no rales. She exhibits no tenderness.  Abdominal: Soft. Bowel sounds are normal. There is tenderness (mild suprapubic tenderness). There is no rebound and no guarding.  Mild bilateral CVA tenderness  Musculoskeletal: Normal range of motion. She exhibits no edema.  Lymphadenopathy:    She has no cervical adenopathy.  Neurological: She is alert and oriented to person, place, and time.  Skin: Skin is warm and dry. No rash noted.  Psychiatric: She has a normal mood and affect.    ED Course  Procedures (including critical care time) Labs Review Results for orders placed during the hospital encounter of 01/14/14  CBC WITH DIFFERENTIAL      Result Value Ref Range   WBC 12.6 (*) 4.0 - 10.5 K/uL   RBC 4.41  3.87 - 5.11 MIL/uL   Hemoglobin 13.1  12.0 - 15.0 g/dL   HCT 39.9  36.0 - 46.0 %   MCV 90.5  78.0 - 100.0 fL   MCH 29.7  26.0 - 34.0 pg   MCHC 32.8  30.0 - 36.0 g/dL   RDW 12.6  11.5 - 15.5 %   Platelets 208  150 - 400 K/uL   Neutrophils Relative % 89 (*) 43 - 77 %   Neutro Abs 11.1 (*) 1.7 - 7.7 K/uL   Lymphocytes Relative  4 (*) 12 - 46 %   Lymphs Abs 0.5 (*) 0.7 - 4.0 K/uL   Monocytes Relative 7  3 - 12 %   Monocytes Absolute 0.9  0.1 - 1.0 K/uL   Eosinophils Relative 0  0 - 5 %   Eosinophils Absolute 0.0  0.0 - 0.7 K/uL   Basophils Relative 0  0 - 1 %   Basophils Absolute 0.0  0.0 - 0.1 K/uL  COMPREHENSIVE METABOLIC PANEL      Result Value Ref Range   Sodium 137  137 - 147 mEq/L   Potassium 3.3 (*) 3.7 - 5.3 mEq/L   Chloride 100  96 - 112 mEq/L   CO2 22  19 - 32 mEq/L   Glucose, Bld 111 (*) 70 - 99  mg/dL   BUN 11  6 - 23 mg/dL   Creatinine, Ser 1.07  0.50 - 1.10 mg/dL   Calcium 8.8  8.4 - 10.5 mg/dL   Total Protein 6.9  6.0 - 8.3 g/dL   Albumin 3.3 (*) 3.5 - 5.2 g/dL   AST 13  0 - 37 U/L   ALT 15  0 - 35 U/L   Alkaline Phosphatase 62  39 - 117 U/L   Total Bilirubin 0.9  0.3 - 1.2 mg/dL   GFR calc non Af Amer 57 (*) >90 mL/min   GFR calc Af Amer 65 (*) >90 mL/min   Anion gap 15  5 - 15  URINALYSIS, ROUTINE W REFLEX MICROSCOPIC      Result Value Ref Range   Color, Urine AMBER (*) YELLOW   APPearance TURBID (*) CLEAR   Specific Gravity, Urine 1.023  1.005 - 1.030   pH 5.5  5.0 - 8.0   Glucose, UA NEGATIVE  NEGATIVE mg/dL   Hgb urine dipstick SMALL (*) NEGATIVE   Bilirubin Urine SMALL (*) NEGATIVE   Ketones, ur NEGATIVE  NEGATIVE mg/dL   Protein, ur 100 (*) NEGATIVE mg/dL   Urobilinogen, UA 1.0  0.0 - 1.0 mg/dL   Nitrite POSITIVE (*) NEGATIVE   Leukocytes, UA LARGE (*) NEGATIVE  URINE MICROSCOPIC-ADD ON      Result Value Ref Range   Squamous Epithelial / LPF MANY (*) RARE   WBC, UA TOO NUMEROUS TO COUNT  <3 WBC/hpf   RBC / HPF 0-2  <3 RBC/hpf   Bacteria, UA MANY (*) RARE   Urine-Other MUCOUS PRESENT    D-DIMER, QUANTITATIVE      Result Value Ref Range   D-Dimer, Quant 1.15 (*) 0.00 - 0.48 ug/mL-FEU  I-STAT CG4 LACTIC ACID, ED      Result Value Ref Range   Lactic Acid, Venous 2.68 (*) 0.5 - 2.2 mmol/L   No results found.   Imaging Review Dg Chest 2 View  01/14/2014   CLINICAL  DATA:  Mild hypoxia.  Fever, headache, dizziness  EXAM: CHEST  2 VIEW  COMPARISON:  03/27/2013; 10/10/2009 ; 04/14/1999  FINDINGS: Grossly unchanged borderline enlarged cardiac silhouette and mediastinal contours with persistent leftward deviation of the tracheal air column at the level of the thoracic inlet. Interval development of ill-defined heterogeneous airspace opacities within the left lower lung. No pleural effusion or pneumothorax. There is unchanged diffuse thickening of the pulmonary interstitium. No unchanged bones. Post cholecystectomy.  IMPRESSION: 1. Findings worrisome for left lower lobe pneumonia superimposed on airways disease. A follow-up chest radiograph in 4 to 6 weeks after treatment is recommended to ensure resolution. 2. Persistent leftward deviation of the tracheal air column at the level thoracic inlet possibly accentuated due to decreased lung volumes and patient rotation, unchanged since the 2014 examination though new since the 2011 exam. Again, attention on follow-up is recommended.   Electronically Signed   By: Sandi Mariscal M.D.   On: 01/14/2014 13:02     EKG Interpretation   Date/Time:  Sunday January 14 2014 14:14:46 EDT Ventricular Rate:  121 PR Interval:  134 QRS Duration: 89 QT Interval:  304 QTC Calculation: 431 R Axis:   36 Text Interpretation:  Sinus tachycardia Baseline wander in lead(s) II III  aVF V4 SINCE LAST TRACING HEART RATE HAS INCREASED Confirmed by Shondell Fabel  MD,  Daryn Hicks ([id number]) on 01/14/2014 2:18:37 PM      MDM   Final diagnoses:  Pyelonephritis  Community acquired  pneumonia    Pt presents with symptoms consistent with pyelonephritis. She has been intermittently hypotensive but has responded to IV fluids. She also had some mild hypoxia with sats going down into the low 90s. Given this I did a chest x-ray which shows possible infiltrate consistent with pneumonia. She was covered with Rocephin and Zithromax for both the UTI and possible  pneumonia. She does state that she has recently traveled to [place]. She denies any leg pain or swelling. She denies any pleuritic-type chest pain. I did check a d-dimer that was positive. However we are unable to get a CTA of the chest due to iodine allergy. She was covered with a dose of Lovenox for tonight and can possibly have a VQ scan in the morning. This was discussed with Dr. [place] Pink, with the hospitalist service she will admit the patient. I did order venous Dopplers of the lower extremities.    Malvin Johns, MD 01/14/14 [phone]

## 2014-01-14 NOTE — Progress Notes (Signed)
*  Preliminary Results* Bilateral lower extremity venous duplex completed. Bilateral lower extremities are negative for deep vein thrombosis. There is no evidence of Baker's cyst bilaterally.  01/14/2014  Maudry Mayhew, RVT, RDCS, RDMS

## 2014-01-14 NOTE — H&P (Addendum)
History and Physical  Olivia Barker [id number]:[id number] DOB: [birth year] DOA: 01/14/2014  Referring physician: Malvin Johns, ER physician PCP: Henrine Screws, MD   Chief Complaint: Chills  HPI: Olivia Barker is a 58 y.o. female  Past medical history of anxiety, hyperlipidemia and gastroparesis who reports being in her usual state of health and then for the last few days, her boyfriend has noticed that her heart rate was quite high in the 110s-120s. She herself did not note any major problems until last night patient suddenly started having chills and rigors. This did not subside and she checked her temperature was 102. This morning her symptoms were persisting in her temperature was 104 she came into the emergency room. Patient did not note any shortness of breath, cough or dysuria.  In the emergency room, patient was noted to have mild anion gap with a lactic acid level 2.68. Her blood pressure was somewhat soft 93 systolic. Her white count was only mildly elevated at 11.4 initially. Patient was noted to have a very large urinary tract infection. Patient was initially given a dose of Rocephin and started on IV fluids. He was also noted the patient's oxygen saturations were 91% and dropped to as low as 88% on room air. Chest x-ray was done which noted an area concerning for left lower lobe pneumonia.. Patient was started on oxygen and given a dose of Zithromax as well. The ER physician wanted to rule out a pulmonary embolus and ordered a d-dimer which was elevated at 1.15. The patient however has an allergy to Iohexol, contrast dye and so a CT scan of the chest could not be done. Patient to get lower extremity Dopplers which were unrevealing. After several liters of fluid, the patient's blood pressure still remains soft soft discussion with the hospitalist, it was felt best the patient be admitted and go to the step down unit.   Review of Systems:  Patient seen after transferred to step down. Pt  complains of elevated heart rate and feeling fatigued.  Pt denies any headaches, vision changes, dysphagia, chest pain, shortness of breath, wheeze, cough, abdominal pain, hematuria, dysuria, constipation, diarrhea, focal extremity numbness or weakness or pain. Review systems otherwise negative.  Review of systems are otherwise negative  Past Medical History  Diagnosis Date  . Interstitial cystitis 1992  . Hyperlipidemia   . Gastroparesis   . PONV (postoperative nausea and vomiting)   . Anxiety   . Depression    Past Surgical History  Procedure Laterality Date  . Cesarean section  06-12-1986  . Eus N/A 06/29/2012    Procedure: ESOPHAGEAL ENDOSCOPIC ULTRASOUND (EUS) RADIAL;  Surgeon: Arta Silence, MD;  Location: [facility];  Service: Endoscopy;  Laterality: N/A;  . Fracture surgery  01/2011    R) foot  . Abdominal hysterectomy  1998    partial  . Cholecystectomy  apr 2014  . Eus N/A 10/04/2013    Procedure: ESOPHAGEAL ENDOSCOPIC ULTRASOUND (EUS) RADIAL;  Surgeon: Arta Silence, MD;  Location: [facility];  Service: Endoscopy;  Laterality: N/A;   Social History:  reports that she has never smoked. She has never used smokeless tobacco. She reports that she does not drink alcohol or use illicit drugs. Patient lives at home & is able to participate in activities of daily living with out assistance. She is quite active  Allergies  Allergen Reactions  . Aspirin Anaphylaxis  . Iohexol Anaphylaxis    During ivp in 1993 pt's throat began to swell w/ anaphylaxis. No  studies have been done w/ 13 hr prep//a.calhoun   . Nsaids Anaphylaxis    Family History  Problem Relation Age of Onset  . Heart disease Father   . Heart attack Father    also diabetes. This was confirmed by patient  Prior to Admission medications   Medication Sig Start Date End Date Taking? Authorizing Provider  acetaminophen (TYLENOL) 500 MG tablet Take 1,000 mg by mouth every 6 (six) hours as needed for mild pain.    Yes Historical Provider, MD  ALPRAZolam Duanne Moron) 0.5 MG tablet Take 0.25-0.5 mg by mouth daily as needed for anxiety.  12/21/11  Yes Historical Provider, MD  buPROPion (WELLBUTRIN XL) 150 MG 24 hr tablet Take 150 mg by mouth every morning.   Yes Historical Provider, MD  dronabinol (MARINOL) 2.5 MG capsule Take 2.5 mg by mouth 2 (two) times daily as needed. For nausea   Yes Historical Provider, MD  FLUoxetine (PROZAC) 20 MG tablet Take 20 mg by mouth every morning.    Yes Historical Provider, MD  HYDROcodone-acetaminophen (NORCO/VICODIN) 5-325 MG per tablet Take 1 tablet by mouth every 4 (four) hours as needed for moderate pain. For pain 12/28/11  Yes Historical Provider, MD  NON FORMULARY Apply 1 application topically every 6 (six) hours as needed (for nausea).   Yes Historical Provider, MD  prochlorperazine (COMPAZINE) 10 MG tablet Take 10 mg by mouth every 6 (six) hours as needed for nausea or vomiting.   Yes Historical Provider, MD  rosuvastatin (CRESTOR) 20 MG tablet Take 20 mg by mouth daily.   Yes Historical Provider, MD  triamterene-hydrochlorothiazide (MAXZIDE) 75-50 MG per tablet Take 1 tablet by mouth daily as needed (for fluid in her ear).  01/04/12  Yes Historical Provider, MD  zolpidem (AMBIEN) 10 MG tablet Take 10 mg by mouth at bedtime as needed for sleep.    Yes Historical Provider, MD    Physical Exam: BP 122/65  Pulse 127  Temp(Src) 99.7 F (37.6 C) (Oral)  Resp 26  Ht 5\' 2"  (1.575 m)  Wt 73.7 kg (162 lb 7.7 oz)  BMI 29.71 kg/m2  SpO2 94%  General:  Alert and oriented x3, no acute distress Eyes: Sclera nonicteric, extraocular movements are intact ENT: Normocephalic and atraumatic, mucous membranes are slightly dry Neck: No JVD Cardiovascular: Regular rhythm, tachycardia, S1-S2 Respiratory: Clear to auscultation bilaterally with some minimal decrease at the left base Abdomen:  Soft, nontender, nondistended, hypoactive bowel sounds Skin: No skin breaks, tears or  lesions Musculoskeletal: No clubbing or cyanosis or edema Psychiatric: Patient is appropriate, no evidence of psychoses Neurologic: No focal deficits           Labs on Admission:  Basic Metabolic Panel:  Recent Labs Lab 01/14/14 1120  NA 137  K 3.3*  CL 100  CO2 22  GLUCOSE 111*  BUN 11  CREATININE 1.07  CALCIUM 8.8   Liver Function Tests:  Recent Labs Lab 01/14/14 1120  AST 13  ALT 15  ALKPHOS 62  BILITOT 0.9  PROT 6.9  ALBUMIN 3.3*   No results found for this basename: LIPASE, AMYLASE,  in the last 168 hours No results found for this basename: AMMONIA,  in the last 168 hours CBC:  Recent Labs Lab 01/14/14 1019 01/14/14 1120  WBC 11.4* 12.6*  NEUTROABS  --  11.1*  HGB 12.9 13.1  HCT 39.2 39.9  MCV 91.3 90.5  PLT  --  208   Cardiac Enzymes: No results found for this basename: CKTOTAL,  CKMB, CKMBINDEX, TROPONINI,  in the last 168 hours  BNP (last 3 results) No results found for this basename: PROBNP,  in the last 8760 hours CBG: No results found for this basename: GLUCAP,  in the last 168 hours  Radiological Exams on Admission: Dg Chest 2 View  01/14/2014   CLINICAL DATA:  Mild hypoxia.  Fever, headache, dizziness  EXAM: CHEST  2 VIEW  COMPARISON:  03/27/2013; 10/10/2009 ; 04/14/1999  FINDINGS: Grossly unchanged borderline enlarged cardiac silhouette and mediastinal contours with persistent leftward deviation of the tracheal air column at the level of the thoracic inlet. Interval development of ill-defined heterogeneous airspace opacities within the left lower lung. No pleural effusion or pneumothorax. There is unchanged diffuse thickening of the pulmonary interstitium. No unchanged bones. Post cholecystectomy.  IMPRESSION: 1. Findings worrisome for left lower lobe pneumonia superimposed on airways disease. A follow-up chest radiograph in 4 to 6 weeks after treatment is recommended to ensure resolution. 2. Persistent leftward deviation of the tracheal air  column at the level thoracic inlet possibly accentuated due to decreased lung volumes and patient rotation, unchanged since the 2014 examination though new since the 2011 exam. Again, attention on follow-up is recommended.   Electronically Signed   By: Sandi Mariscal M.D.   On: 01/14/2014 13:02    EKG: Independently reviewed. Sinus tachycardia  Assessment/Plan Present on Admission:  . Pyelonephritis: Suspect that this is main cause of sepsis as she has a significant UTI as compared to the less than impressive chest x-ray. On IV Rocephin  Post medication for pain and nausea  . Overweight: Patient quit. BMI greater than 25  . Generalized anxiety disorder: Continue when necessary Xanax from home.  . Depression: Continue home SSRI.  Marland Kitchen Sepsis: Principal problem. Patient meets criteria given hypotension, fever, mild leukocytosis and source from urine and/or lung  . Acute respiratory failure with hypoxia:  . CAP (community acquired pneumonia): IV Rocephin and Zithromax. The patient's hypoxia has improved her will get a VQ scan to rule out PE, I am not too impressed by this at this time.  The earliest because of the holiday weekend would be able to get the scan would be Tuesday. In the meantime, will cover with full strength Lovenox.  . Cardiomegaly: Chronic seen on previous x-ray. Given hypoxia, will check BNP to rule out subtle underlying heart failure  Consultants: None  Code Status: Full code  Family Communication: Boyfriend at the bedside   Disposition Plan: If there is improvement in blood pressure, heart rate and fever by tomorrow, patient can likely be transferred upstairs. Await cultures. Likely here for several days  Time spent: 35 minutes  Annita Brod [facility] Pager [phone]  **Disclaimer: This note may have been dictated with voice recognition software. Similar sounding words can inadvertently be transcribed and this note may contain transcription errors which  may not have been corrected upon publication of note.**

## 2014-01-14 NOTE — ED Notes (Signed)
Pt sent here by Dr. Everlene Farrier.  Pt states that she has hx of bladder infections.  Dr. Everlene Farrier believes pt has pyelo.  States that she has been having high fevers at home (as high as 104).  C/o dysuria.  Nauseated.  States that she feels "crampy in her back".

## 2014-01-15 DIAGNOSIS — A4151 Sepsis due to Escherichia coli [E. coli]: Principal | ICD-10-CM

## 2014-01-15 LAB — BASIC METABOLIC PANEL
Anion gap: 11 (ref 5–15)
BUN: 8 mg/dL (ref 6–23)
CO2: 21 mEq/L (ref 19–32)
CREATININE: 0.77 mg/dL (ref 0.50–1.10)
Calcium: 7 mg/dL — ABNORMAL LOW (ref 8.4–10.5)
Chloride: 110 mEq/L (ref 96–112)
GFR calc non Af Amer: >90 mL/min (ref >90)
Glucose, Bld: 100 mg/dL — ABNORMAL HIGH (ref 70–99)
Potassium: 3.6 mEq/L — ABNORMAL LOW (ref 3.7–5.3)
Sodium: 142 mEq/L (ref 137–147)

## 2014-01-15 LAB — CBC
HEMATOCRIT: 31.5 % — AB (ref 36.0–46.0)
HEMOGLOBIN: 10.2 g/dL — AB (ref 12.0–15.0)
MCH: 30.1 pg (ref 26.0–34.0)
MCHC: 32.4 g/dL (ref 30.0–36.0)
MCV: 92.9 fL (ref 78.0–100.0)
Platelets: ADEQUATE 10*3/uL (ref 150–400)
RBC: 3.39 MIL/uL — ABNORMAL LOW (ref 3.87–5.11)
RDW: 13.2 % (ref 11.5–15.5)
WBC: 11.6 10*3/uL — ABNORMAL HIGH (ref 4.0–10.5)

## 2014-01-15 LAB — LEGIONELLA ANTIGEN, URINE: LEGIONELLA ANTIGEN, URINE: NEGATIVE

## 2014-01-15 LAB — HIV ANTIBODY (ROUTINE TESTING W REFLEX): HIV 1&2 Ab, 4th Generation: NONREACTIVE

## 2014-01-15 LAB — STREP PNEUMONIAE URINARY ANTIGEN: Strep Pneumo Urinary Antigen: NEGATIVE

## 2014-01-15 MED ORDER — PANTOPRAZOLE SODIUM 40 MG IV SOLR
40.0000 mg | Freq: Every day | INTRAVENOUS | Status: DC
  Administered 2014-01-15 – 2014-01-16 (×2): 40 mg via INTRAVENOUS
  Filled 2014-01-15 (×3): qty 40

## 2014-01-15 MED ORDER — MORPHINE SULFATE 2 MG/ML IJ SOLN
1.0000 mg | INTRAMUSCULAR | Status: DC | PRN
  Administered 2014-01-15 – 2014-01-16 (×3): 1 mg via INTRAVENOUS
  Filled 2014-01-15 (×3): qty 1

## 2014-01-15 MED ORDER — PROMETHAZINE HCL 25 MG PO TABS
12.5000 mg | ORAL_TABLET | Freq: Four times a day (QID) | ORAL | Status: DC | PRN
  Administered 2014-01-15 – 2014-01-16 (×2): 12.5 mg via ORAL
  Filled 2014-01-15 (×2): qty 1

## 2014-01-15 MED ORDER — ACETAMINOPHEN 325 MG PO TABS
650.0000 mg | ORAL_TABLET | Freq: Four times a day (QID) | ORAL | Status: DC | PRN
  Administered 2014-01-15 – 2014-01-16 (×2): 650 mg via ORAL
  Filled 2014-01-15 (×2): qty 2

## 2014-01-15 MED ORDER — SODIUM CHLORIDE 0.9 % IV BOLUS (SEPSIS)
500.0000 mL | Freq: Once | INTRAVENOUS | Status: AC
  Administered 2014-01-15: 500 mL via INTRAVENOUS

## 2014-01-15 MED ORDER — HYDROCODONE-ACETAMINOPHEN 5-325 MG PO TABS
1.0000 | ORAL_TABLET | Freq: Four times a day (QID) | ORAL | Status: DC | PRN
  Administered 2014-01-16: 1 via ORAL
  Filled 2014-01-15: qty 1

## 2014-01-15 MED ORDER — FLUOXETINE HCL 20 MG PO CAPS
20.0000 mg | ORAL_CAPSULE | Freq: Every morning | ORAL | Status: DC
  Administered 2014-01-15 – 2014-01-16 (×2): 20 mg via ORAL
  Filled 2014-01-15 (×3): qty 1

## 2014-01-15 MED ORDER — ONDANSETRON HCL 4 MG/2ML IJ SOLN
4.0000 mg | Freq: Four times a day (QID) | INTRAMUSCULAR | Status: DC | PRN
  Administered 2014-01-15 – 2014-01-16 (×3): 4 mg via INTRAVENOUS
  Filled 2014-01-15 (×2): qty 2

## 2014-01-15 MED ORDER — HYDROCODONE-ACETAMINOPHEN 5-325 MG PO TABS
1.0000 | ORAL_TABLET | ORAL | Status: DC | PRN
  Administered 2014-01-15 (×2): 1 via ORAL
  Filled 2014-01-15 (×2): qty 1

## 2014-01-15 MED ORDER — ALUM & MAG HYDROXIDE-SIMETH 200-200-20 MG/5ML PO SUSP
30.0000 mL | Freq: Once | ORAL | Status: AC
  Administered 2014-01-15: 30 mL via ORAL
  Filled 2014-01-15: qty 30

## 2014-01-15 MED ORDER — POTASSIUM CHLORIDE IN NACL 40-0.9 MEQ/L-% IV SOLN
INTRAVENOUS | Status: DC
  Administered 2014-01-15 – 2014-01-16 (×2): 100 mL/h via INTRAVENOUS
  Filled 2014-01-15 (×2): qty 1000

## 2014-01-15 MED ORDER — SIMETHICONE 80 MG PO CHEW: 80.0000 mg | CHEWABLE_TABLET | Freq: Once | ORAL | Status: DC

## 2014-01-15 NOTE — Progress Notes (Signed)
CARE MANAGEMENT NOTE 01/15/2014  Patient:  Olivia Barker, Olivia Barker   Account Number:  [id number]  Date Initiated:  01/15/2014  Documentation initiated by:  Aalijah Lanphere  Subjective/Objective Assessment:   sepsis of urinary versus pulmonary origin.     Action/Plan:   home when stable   Anticipated DC Date:  01/18/2014   Anticipated DC Plan:  HOME/SELF CARE  In-house referral  NA      DC Planning Services  CM consult      PAC Choice  NA   Choice offered to / List presented to:  NA   DME arranged  NA      DME agency  NA     [place] arranged  NA      [place] agency  NA   Status of service:  In process, will continue to follow Medicare Important Message given?   (If response is "NO", the following Medicare IM given date fields will be blank) Date Medicare IM given:   Medicare IM given by:   Date Additional Medicare IM given:   Additional Medicare IM given by:    Discharge Disposition:    Per UR Regulation:  Reviewed for med. necessity/level of care/duration of stay  If discussed at [place] of Stay Meetings, dates discussed:    Comments:  Suanne Marker Khadeja Abt,RN,BSN,CCM

## 2014-01-15 NOTE — Progress Notes (Signed)
PROGRESS NOTE    Olivia Barker XNT:[id number] DOB: Jul 27, 1955 DOA: 01/14/2014 PCP: Henrine Screws, MD Primary Urologist: Dr. Irine Seal. Primary Cardiologist: Dr. Dorris Carnes.  HPI/Brief narrative 58 year old female patient with history of recurrent UTIs, HLD, anxiety, depression, gastroparesis, strong family history of CAD, presented with history of high fever (104F), cloudy and foul-smelling urine, difficulty urinating but no dysuria. In the ED, she had soft blood pressures 93 mmHg systolic, mild leukocytosis, positive UA. She was started on IV Rocephin and IV fluids. She also had oxygen saturations drop to 88% on room air in the absence of respiratory symptoms. Chest x-ray was concerning for LLL pneumonia and azithromycin given. D-dimer was positive. Unable to do CTA chest to rule out PE, due to contrast allergy. Lower extremity venous Dopplers negative. Patient started on full dose Lovenox pending VQ scan on 9/8. Admitted to step down unit for further management.   Assessment/Plan:  1. Escherichia coli Acute pyelonephritis: Continue IV Rocephin pending final sensitivities.  2. Sepsis secondary to E Coli acute pyelonephritis +/- CAP: Continue IV fluids, Rocephin and azithromycin. Blood culture x1: Negative to date. Urine culture shows Escherichia coli-sensitivities pending. Diuretics temporarily held-? Indication at home. 3. Possible LLL community acquired pneumonia: Continue IV azithromycin and Rocephin. Not convinced based on symptoms and chest x-ray. Follow chest x-ray in a.m. 4. Acute respiratory failure with hypoxia:? Secondary to pneumonia. Rule out PE. Hypoxia seems to have resolved. Continue full dose Lovenox until able to get VQ scan-possibly 9/8. ProBNP not significantly elevated and no clinical features suggestive of CHF. 5. Hypokalemia: Replace as needed and followup. 6. History of anxiety and depression: Continue bupropion, Prozac (takes both and has been on these > 6 months)  and when necessary and Xanax. 7. Anemia: Possibly dilutional. No reported bleeding. Follow CBCs. 8. History of gastroparesis: Patient complains of nausea not relieved by Zofran. States that she takes Phenergan at home for same-trial of Phenergan. PPI. 9. Overweight: BMI >25   Code Status:  Full  Family Communication:  none at bedside  Disposition Plan: Continue management in step down unit for additional 24 hours.    Consultants:  None  Procedures:  None  Antibiotics:  IV Rocephin 9/6 >  IV Azithromycin 9/6 >   Subjective:  Patient feels worn out. Complains of headache and some nasal stuffiness which she attributes to oxygen supplementation. Fevers better this morning.   Objective: Filed Vitals:   01/15/14 0700 01/15/14 0800 01/15/14 0900 01/15/14 1200  BP: 108/63 95/56    Pulse: 91 101 95   Temp:  99.2 F (37.3 C)  100.4 F (38 C)  TempSrc:  Oral  Oral  Resp: 23 24 23    Height:      Weight:      SpO2: 98% 98% 99%     Intake/Output Summary (Last 24 hours) at 01/15/14 1334 Last data filed at 01/15/14 1000  Gross per 24 hour  Intake   3720 ml  Output   2400 ml  Net   1320 ml   Filed Weights   01/14/14 1744 01/15/14 0600  Weight: 73.7 kg (162 lb 7.7 oz) 74.9 kg (165 lb 2 oz)     Exam:  General exam:  pleasant middle-aged female seen sitting up comfortably in bed.  Respiratory system: Clear. No increased work of breathing. Cardiovascular system: S1 & S2 heard, RRR. No JVD, murmurs, gallops, clicks or pedal edema. Telemetry: Sinus tachycardia on admission >sinus rhythm  Gastrointestinal system: Abdomen is nondistended, soft and  nontender. Normal bowel sounds heard. Central nervous system: Alert and oriented. No focal neurological deficits. Extremities: Symmetric 5 x 5 power.   Data Reviewed: Basic Metabolic Panel:  Recent Labs Lab 01/14/14 1120 01/15/14 0358  NA 137 142  K 3.3* 3.6*  CL 100 110  CO2 22 21  GLUCOSE 111* 100*  BUN 11 8    CREATININE 1.07 0.77  CALCIUM 8.8 7.0*   Liver Function Tests:  Recent Labs Lab 01/14/14 1120  AST 13  ALT 15  ALKPHOS 62  BILITOT 0.9  PROT 6.9  ALBUMIN 3.3*   No results found for this basename: LIPASE, AMYLASE,  in the last 168 hours No results found for this basename: AMMONIA,  in the last 168 hours CBC:  Recent Labs Lab 01/14/14 1019 01/14/14 1120 01/15/14 0358  WBC 11.4* 12.6* 11.6*  NEUTROABS  --  11.1*  --   HGB 12.9 13.1 10.2*  HCT 39.2 39.9 31.5*  MCV 91.3 90.5 92.9  PLT  --  208 PLATELET CLUMPS NOTED ON SMEAR, COUNT APPEARS ADEQUATE   Cardiac Enzymes: No results found for this basename: CKTOTAL, CKMB, CKMBINDEX, TROPONINI,  in the last 168 hours BNP (last 3 results)  Recent Labs  01/14/14 1853  PROBNP 463.7*   CBG: No results found for this basename: GLUCAP,  in the last 168 hours  Recent Results (from the past 240 hour(s))  URINE CULTURE     Status: None   Collection Time    01/14/14 10:40 AM      Result Value Ref Range Status   Colony Count >=100,000 COLONIES/ML   Preliminary   Preliminary Report ESCHERICHIA COLI   Preliminary  URINE CULTURE     Status: None   Collection Time    01/14/14 11:12 AM      Result Value Ref Range Status   Specimen Description URINE, RANDOM   Final   Special Requests Normal   Final   Culture  Setup Time     Final   Value: 01/14/2014 18:56     Performed at [facility]     Final   Value: >=100,000 COLONIES/ML     Performed at [facility]   Culture     Final   Value: ESCHERICHIA COLI     Performed at [facility]   Report Status PENDING   Incomplete  CULTURE, BLOOD (ROUTINE X 2)     Status: None   Collection Time    01/14/14 12:21 PM      Result Value Ref Range Status   Specimen Description BLOOD LEFT ARM   Final   Special Requests BOTTLES DRAWN AEROBIC AND ANAEROBIC 3 CC   Final   Culture  Setup Time     Final   Value: 01/14/2014 19:17     Performed at [facility]   Culture     Final   Value:        BLOOD CULTURE RECEIVED NO GROWTH TO DATE CULTURE WILL BE HELD FOR 5 DAYS BEFORE ISSUING A FINAL NEGATIVE REPORT     Performed at [facility]   Report Status PENDING   Incomplete  MRSA PCR SCREENING     Status: None   Collection Time    01/14/14  5:36 PM      Result Value Ref Range Status   MRSA by PCR NEGATIVE  NEGATIVE Final   Comment:  The GeneXpert MRSA Assay (FDA     approved for NASAL specimens     only), is one component of a     comprehensive MRSA colonization     surveillance program. It is not     intended to diagnose MRSA     infection nor to guide or     monitor treatment for     MRSA infections.       Studies: Dg Chest 2 View  01/14/2014   CLINICAL DATA:  Mild hypoxia.  Fever, headache, dizziness  EXAM: CHEST  2 VIEW  COMPARISON:  03/27/2013; 10/10/2009 ; 04/14/1999  FINDINGS: Grossly unchanged borderline enlarged cardiac silhouette and mediastinal contours with persistent leftward deviation of the tracheal air column at the level of the thoracic inlet. Interval development of ill-defined heterogeneous airspace opacities within the left lower lung. No pleural effusion or pneumothorax. There is unchanged diffuse thickening of the pulmonary interstitium. No unchanged bones. Post cholecystectomy.  IMPRESSION: 1. Findings worrisome for left lower lobe pneumonia superimposed on airways disease. A follow-up chest radiograph in 4 to 6 weeks after treatment is recommended to ensure resolution. 2. Persistent leftward deviation of the tracheal air column at the level thoracic inlet possibly accentuated due to decreased lung volumes and patient rotation, unchanged since the 2014 examination though new since the 2011 exam. Again, attention on follow-up is recommended.   Electronically Signed   By: Sandi Mariscal M.D.   On: 01/14/2014 13:02        Scheduled Meds: . atorvastatin  40 mg Oral q1800  . azithromycin  500 mg  Intravenous Q24H  . buPROPion  150 mg Oral q morning - 10a  . cefTRIAXone (ROCEPHIN)  IV  1 g Intravenous Q24H  . enoxaparin (LOVENOX) injection  70 mg Subcutaneous Q12H  . FLUoxetine  20 mg Oral q morning - 10a  . metoCLOPramide (REGLAN) injection  10 mg Intravenous Once  . ondansetron (ZOFRAN) IV  4 mg Intravenous Once   Continuous Infusions: . sodium chloride 1,000 mL (01/15/14 0921)    Principal Problem:   Sepsis Active Problems:   Pyelonephritis   Overweight   Generalized anxiety disorder   Depression   Acute respiratory failure with hypoxia   CAP (community acquired pneumonia)   Cardiomegaly    Time spent: 31 minutes    Kaylei Frink, MD, FACP, FHM. [facility] Pager [phone]  If 7PM-7AM, please contact night-coverage [email or web address] Password [email or web address] 01/15/2014, 1:34 PM    LOS: 1 day

## 2014-01-16 ENCOUNTER — Inpatient Hospital Stay (HOSPITAL_COMMUNITY): Payer: BC Managed Care – PPO

## 2014-01-16 DIAGNOSIS — D649 Anemia, unspecified: Secondary | ICD-10-CM

## 2014-01-16 LAB — URINE CULTURE
Colony Count: >=100000
Special Requests: NORMAL

## 2014-01-16 LAB — BASIC METABOLIC PANEL
Anion gap: 7 (ref 5–15)
BUN: 4 mg/dL — AB (ref 6–23)
CALCIUM: 7.5 mg/dL — AB (ref 8.4–10.5)
CO2: 24 mEq/L (ref 19–32)
Chloride: 109 mEq/L (ref 96–112)
Creatinine, Ser: 0.81 mg/dL (ref 0.50–1.10)
GFR, EST NON AFRICAN AMERICAN: 79 mL/min — AB (ref >90)
Glucose, Bld: 100 mg/dL — ABNORMAL HIGH (ref 70–99)
POTASSIUM: 4.2 meq/L (ref 3.7–5.3)
SODIUM: 140 meq/L (ref 137–147)

## 2014-01-16 LAB — CBC
HCT: 31.6 % — ABNORMAL LOW (ref 36.0–46.0)
Hemoglobin: 10.6 g/dL — ABNORMAL LOW (ref 12.0–15.0)
MCH: 30.5 pg (ref 26.0–34.0)
MCHC: 33.5 g/dL (ref 30.0–36.0)
MCV: 90.8 fL (ref 78.0–100.0)
PLATELETS: 143 10*3/uL — AB (ref 150–400)
RBC: 3.48 MIL/uL — AB (ref 3.87–5.11)
RDW: 12.9 % (ref 11.5–15.5)
WBC: 7.9 10*3/uL (ref 4.0–10.5)

## 2014-01-16 MED ORDER — TECHNETIUM TC 99M DIETHYLENETRIAME-PENTAACETIC ACID: 41.4000 | Freq: Once | INTRAVENOUS | Status: AC | PRN

## 2014-01-16 MED ORDER — TECHNETIUM TO 99M ALBUMIN AGGREGATED
5.2000 | Freq: Once | INTRAVENOUS | Status: AC | PRN
  Administered 2014-01-16: 5 via INTRAVENOUS

## 2014-01-16 MED ORDER — ENOXAPARIN SODIUM 40 MG/0.4ML ~~LOC~~ SOLN
40.0000 mg | SUBCUTANEOUS | Status: DC
  Filled 2014-01-16: qty 0.4

## 2014-01-16 NOTE — Progress Notes (Signed)
ANTICOAGULATION CONSULT NOTE - Initial Consult  Pharmacy Consult for Lovenox Indication: VTE prophylaxis  Allergies  Allergen Reactions  . Aspirin Anaphylaxis  . Iohexol Anaphylaxis    During ivp in 1993 pt's throat began to swell w/ anaphylaxis. No studies have been done w/ 13 hr prep//a.calhoun   . Nsaids Anaphylaxis    Patient Measurements: Height: 5\' 2"  (157.5 cm) Weight: 168 lb 6.9 oz (76.4 kg) IBW/kg (Calculated) : 50.1  Vital Signs: Temp: 98.3 F (36.8 C) (09/08 1429) Temp src: Axillary (09/08 1429) BP: 117/67 mmHg (09/08 1429) Pulse Rate: 94 (09/08 1429)  Labs:  Recent Labs  01/14/14 1120 01/15/14 0358 01/16/14 0335  HGB 13.1 10.2* 10.6*  HCT 39.9 31.5* 31.6*  PLT 208 PLATELET CLUMPS NOTED ON SMEAR, COUNT APPEARS ADEQUATE 143*  CREATININE 1.07 0.77 0.81    Estimated Creatinine Clearance: 73.3 ml/min (by C-G formula based on Cr of 0.81).   Medical History: Past Medical History  Diagnosis Date  . Interstitial cystitis 1992  . Hyperlipidemia   . Gastroparesis   . PONV (postoperative nausea and vomiting)   . Anxiety   . Depression     Medications:  Scheduled:  . atorvastatin  40 mg Oral q1800  . buPROPion  150 mg Oral q morning - 10a  . cefTRIAXone (ROCEPHIN)  IV  1 g Intravenous Q24H  . [START ON 01/17/2014] enoxaparin (LOVENOX) injection  40 mg Subcutaneous Q24H  . FLUoxetine  20 mg Oral q morning - 10a  . pantoprazole (PROTONIX) IV  40 mg Intravenous Daily   Infusions:    Assessment:  58 yr female admitted on 9/6 for acute respiratory failure with hypoxia, CAP.  Full dose Lovenox (70mg  sq q12h) initiated on 9/6 until VQ scan could be done on 9/8 as pt unable to have CTAngio performed to due dye allergy  VQ scan done today shows no pulmonary embolism  Pharmacy requested to adjust dose of Lovenox from full dose to prophylactic dose.  Last dose of Lovenox 70mg  was given on 9/8 @ 06:57  BMI = 30 and total body weight = 76.4 kg  CrCl > 70  ml/min  Goal of Therapy:   Monitor platelets by anticoagulation protocol: Yes   Plan:  Decrease Lovenox to 40mg  sq q24h (will begin on 9/9 in AM) Pharmacy will sign off consult as no further adjustment of dose needed at this time.  Please re-consult if further assistance needed.  Thank you, Ganon Demasi, Toribio Harbour, PharmD 01/16/2014,2:46 PM

## 2014-01-16 NOTE — Progress Notes (Signed)
o2 dc'd this am. SATS 95 % on room air.  Up to chair and bathroom with minimal assistance.

## 2014-01-16 NOTE — Progress Notes (Addendum)
PROGRESS NOTE    Olivia Barker YNW:[id number] DOB: 17-Jan-1956 DOA: 01/14/2014 PCP: Henrine Screws, MD Primary Urologist: Dr. Irine Seal. Primary Cardiologist: Dr. Dorris Carnes.  HPI/Brief narrative 58 year old female patient with history of recurrent UTIs, HLD, anxiety, depression, gastroparesis, strong family history of CAD, presented with history of high fever (104F), cloudy and foul-smelling urine, difficulty urinating but no dysuria. In the ED, she had soft blood pressures 93 mmHg systolic, mild leukocytosis, positive UA. She was started on IV Rocephin and IV fluids. She also had oxygen saturations drop to 88% on room air in the absence of respiratory symptoms. Chest x-ray was concerning for LLL pneumonia and azithromycin given. D-dimer was positive. Unable to do CTA chest to rule out PE, due to contrast allergy. Lower extremity venous Dopplers negative. Patient started on full dose Lovenox pending VQ scan on 9/8. Admitted to step down unit for further management. Urine cultures positive for Escherichia coli   Assessment/Plan:  1. Escherichia coli Acute pyelonephritis: Continue IV Rocephin pending final sensitivities.  2. Sepsis secondary to E Coli acute pyelonephritis +/- CAP: Treated with IV fluids, Rocephin and azithromycin. Blood culture x1: Negative to date. Urine culture shows Escherichia coli-sensitivities pending. Diuretics temporarily held-? Indication at home. Improved. Did spike a fever last night but has defervesced since. DC IV fluids. Does not look septic or toxic. 3. Possible LLL community acquired pneumonia: Continue IV azithromycin and Rocephin. Not convinced based on symptoms and chest x-ray. Repeat chest x-ray: Small bibasal pleural effusions and minimal probable left basilar atelectasis-based on presentation and followup chest x-ray not convinced that she has pneumonia. Will DC azithromycin. Incentive spirometer. 4. Acute respiratory failure with hypoxia:? Secondary to  atelectasis. Rule out PE. Had brief O2 sats at 88% on 9/7- has been saturating in the mid 90s on room air since this morning. Continue full dose Lovenox until able to get VQ scan-awaiting same. ProBNP not significantly elevated and no clinical features suggestive of CHF. Monitor closely. 5. Hypokalemia: Replace as needed and followup. 6. History of anxiety and depression: Continue bupropion, Prozac (takes both and has been on these > 6 months) and when necessary and Xanax. 7. Anemia: Possibly dilutional. No reported bleeding. Stable 8. History of gastroparesis: Patient complains of nausea not relieved by Zofran. States that she takes Phenergan at home for same-trial of Phenergan. PPI. 9. Overweight: BMI >25 10. Headache: Since admission patient has complained of mild frontal headache but states that it is better than yesterday. No history of chronic headaches.? Part of acute illness. No focal deficits. Supportive therapy and monitor.   Code Status:  Full  Family Communication:  none at bedside  Disposition Plan: Transfer to regular medical bed. Home when medically stable.   Consultants:  None  Procedures:  None  Antibiotics:  IV Rocephin 9/6 >  IV Azithromycin 9/6 > 9/8  Subjective: Overall feels much better. Her headache has improved. Denies any other complaints.  Objective: Filed Vitals:   01/15/14 1956 01/16/14 0000 01/16/14 0400 01/16/14 0800  BP: 104/63 100/55 119/60 127/75  Pulse: 103 97 101 105  Temp: 102.6 F (39.2 C) 99.3 F (37.4 C) 99.4 F (37.4 C) 98.7 F (37.1 C)  TempSrc: Oral Oral Oral Oral  Resp: 22 27 26 25   Height:      Weight:  76.4 kg (168 lb 6.9 oz)    SpO2: 95% 95% 95% 94%    Intake/Output Summary (Last 24 hours) at 01/16/14 1140 Last data filed at 01/16/14 0800  Gross  per 24 hour  Intake   3170 ml  Output      0 ml  Net   3170 ml   Filed Weights   01/14/14 1744 01/15/14 0600 01/16/14 0000  Weight: 73.7 kg (162 lb 7.7 oz) 74.9 kg (165 lb  2 oz) 76.4 kg (168 lb 6.9 oz)     Exam:  General exam:  pleasant middle-aged female lying comfortably in bed.  Respiratory system: Clear. No increased work of breathing. Cardiovascular system: S1 & S2 heard, RRR. No JVD, murmurs, gallops, clicks or pedal edema. Telemetry: SR-ST in 100s. Gastrointestinal system: Abdomen is nondistended, soft and nontender. Normal bowel sounds heard. Central nervous system: Alert and oriented. No focal neurological deficits. Extremities: Symmetric 5 x 5 power.   Data Reviewed: Basic Metabolic Panel:  Recent Labs Lab 01/14/14 1120 01/15/14 0358 01/16/14 0335  NA 137 142 140  K 3.3* 3.6* 4.2  CL 100 110 109  CO2 22 21 24   GLUCOSE 111* 100* 100*  BUN 11 8 4*  CREATININE 1.07 0.77 0.81  CALCIUM 8.8 7.0* 7.5*   Liver Function Tests:  Recent Labs Lab 01/14/14 1120  AST 13  ALT 15  ALKPHOS 62  BILITOT 0.9  PROT 6.9  ALBUMIN 3.3*   No results found for this basename: LIPASE, AMYLASE,  in the last 168 hours No results found for this basename: AMMONIA,  in the last 168 hours CBC:  Recent Labs Lab 01/14/14 1019 01/14/14 1120 01/15/14 0358 01/16/14 0335  WBC 11.4* 12.6* 11.6* 7.9  NEUTROABS  --  11.1*  --   --   HGB 12.9 13.1 10.2* 10.6*  HCT 39.2 39.9 31.5* 31.6*  MCV 91.3 90.5 92.9 90.8  PLT  --  208 PLATELET CLUMPS NOTED ON SMEAR, COUNT APPEARS ADEQUATE 143*   Cardiac Enzymes: No results found for this basename: CKTOTAL, CKMB, CKMBINDEX, TROPONINI,  in the last 168 hours BNP (last 3 results)  Recent Labs  01/14/14 1853  PROBNP 463.7*   CBG: No results found for this basename: GLUCAP,  in the last 168 hours  Recent Results (from the past 240 hour(s))  URINE CULTURE     Status: None   Collection Time    01/14/14 10:40 AM      Result Value Ref Range Status   Colony Count >=100,000 COLONIES/ML   Preliminary   Preliminary Report ESCHERICHIA COLI   Preliminary  URINE CULTURE     Status: None   Collection Time     01/14/14 11:12 AM      Result Value Ref Range Status   Specimen Description URINE, RANDOM   Final   Special Requests Normal   Final   Culture  Setup Time     Final   Value: 01/14/2014 18:56     Performed at [place]     Final   Value: >=100,000 COLONIES/ML     Performed at [facility]   Culture     Final   Value: ESCHERICHIA COLI     Performed at [facility]   Report Status PENDING   Incomplete  CULTURE, BLOOD (ROUTINE X 2)     Status: None   Collection Time    01/14/14 12:21 PM      Result Value Ref Range Status   Specimen Description BLOOD LEFT ARM   Final   Special Requests BOTTLES DRAWN AEROBIC AND ANAEROBIC 3 CC   Final   Culture  Setup Time  Final   Value: 01/14/2014 19:17     Performed at [facility]   Culture     Final   Value:        BLOOD CULTURE RECEIVED NO GROWTH TO DATE CULTURE WILL BE HELD FOR 5 DAYS BEFORE ISSUING A FINAL NEGATIVE REPORT     Performed at [facility]   Report Status PENDING   Incomplete  MRSA PCR SCREENING     Status: None   Collection Time    01/14/14  5:36 PM      Result Value Ref Range Status   MRSA by PCR NEGATIVE  NEGATIVE Final   Comment:            The GeneXpert MRSA Assay (FDA     approved for NASAL specimens     only), is one component of a     comprehensive MRSA colonization     surveillance program. It is not     intended to diagnose MRSA     infection nor to guide or     monitor treatment for     MRSA infections.  CULTURE, BLOOD (ROUTINE X 2)     Status: None   Collection Time    01/14/14  6:40 PM      Result Value Ref Range Status   Specimen Description BLOOD RIGHT ARM  10 ML IN [facility] BOTTLE   Final   Special Requests Normal   Final   Culture  Setup Time     Final   Value: 01/15/2014 00:27     Performed at [facility]   Culture     Final   Value:        BLOOD CULTURE RECEIVED NO GROWTH TO DATE CULTURE WILL BE HELD FOR 5 DAYS BEFORE ISSUING A FINAL  NEGATIVE REPORT     Performed at [facility]   Report Status PENDING   Incomplete       Studies: Dg Chest 2 View  01/16/2014   CLINICAL DATA:  Shortness of breath today, followup LEFT lower lobe pneumonia  EXAM: CHEST  2 VIEW  COMPARISON:  01/14/2014  FINDINGS: Normal heart size and pulmonary vascularity.  LEFT to RIGHT tracheal deviation is decreased versus previous exam.  Tortuosity of thoracic aorta.  Mild persistent LEFT lower lobe opacity suspect mild atelectasis.  Small bibasilar effusions.  Remaining lungs clear.  No pneumothorax or acute osseous findings.  IMPRESSION: Small bibasilar pleural effusions and minimal probable LEFT basilar atelectasis.   Electronically Signed   By: Lavonia Dana M.D.   On: 01/16/2014 08:57   Dg Chest 2 View  01/14/2014   CLINICAL DATA:  Mild hypoxia.  Fever, headache, dizziness  EXAM: CHEST  2 VIEW  COMPARISON:  03/27/2013; 10/10/2009 ; 04/14/1999  FINDINGS: Grossly unchanged borderline enlarged cardiac silhouette and mediastinal contours with persistent leftward deviation of the tracheal air column at the level of the thoracic inlet. Interval development of ill-defined heterogeneous airspace opacities within the left lower lung. No pleural effusion or pneumothorax. There is unchanged diffuse thickening of the pulmonary interstitium. No unchanged bones. Post cholecystectomy.  IMPRESSION: 1. Findings worrisome for left lower lobe pneumonia superimposed on airways disease. A follow-up chest radiograph in 4 to 6 weeks after treatment is recommended to ensure resolution. 2. Persistent leftward deviation of the tracheal air column at the level thoracic inlet possibly accentuated due to decreased lung volumes and patient rotation, unchanged since the 2014 examination though new since the 2011 exam.  Again, attention on follow-up is recommended.   Electronically Signed   By: Sandi Mariscal M.D.   On: 01/14/2014 13:02        Scheduled Meds: . atorvastatin  40 mg Oral  q1800  . azithromycin  500 mg Intravenous Q24H  . buPROPion  150 mg Oral q morning - 10a  . cefTRIAXone (ROCEPHIN)  IV  1 g Intravenous Q24H  . enoxaparin (LOVENOX) injection  70 mg Subcutaneous Q12H  . FLUoxetine  20 mg Oral q morning - 10a  . pantoprazole (PROTONIX) IV  40 mg Intravenous Daily   Continuous Infusions:    Principal Problem:   Sepsis Active Problems:   Pyelonephritis   Overweight   Generalized anxiety disorder   Depression   Acute respiratory failure with hypoxia   CAP (community acquired pneumonia)   Cardiomegaly    Time spent: 54 minutes    HONGALGI,ANAND, MD, FACP, FHM. [facility] Pager [phone]  If 7PM-7AM, please contact night-coverage [email or web address] Password [email or web address] 01/16/2014, 11:40 AM    LOS: 2 days

## 2014-01-17 MED ORDER — SACCHAROMYCES BOULARDII 250 MG PO CAPS: 250.0000 mg | ORAL_CAPSULE | Freq: Two times a day (BID) | ORAL | Status: DC

## 2014-01-17 MED ORDER — OMEPRAZOLE 20 MG PO CPDR: 20.0000 mg | DELAYED_RELEASE_CAPSULE | Freq: Every day | ORAL | Status: DC

## 2014-01-17 MED ORDER — CEPHALEXIN 500 MG PO CAPS: 500.0000 mg | ORAL_CAPSULE | Freq: Two times a day (BID) | ORAL | Status: DC

## 2014-01-17 NOTE — Discharge Summary (Signed)
Physician Discharge Summary  Olivia Barker WYO:[id number] DOB: [birth year] DOA: 01/14/2014  PCP: Henrine Screws, MD  Admit date: 01/14/2014 Discharge date: 01/17/2014  Recommendations for Outpatient Follow-up:  1. F/u with primary care doctor in 1-2 weeks 2. Keflex to complete a 10-day course of treatment 3. CBC to f/u anemia and thrombocytopenia  4. Please follow up pending blood cultures  Discharge Diagnoses:  Principal Problem:   Sepsis due tot E. Coli acute pyelonephritis Active Problems:   Pyelonephritis   Overweight   Generalized anxiety disorder   Depression   Acute respiratory failure with hypoxia   CAP (community acquired pneumonia)   Cardiomegaly   Discharge Condition: stable, improved  Diet recommendation: Regular  Wt Readings from Last 3 Encounters:  01/16/14 76.4 kg (168 lb 6.9 oz)  01/14/14 69.945 kg (154 lb 3.2 oz)  10/04/13 70.308 kg (155 lb)    History of present illness:  58 year old female with history of anxiety, hyperlipidemia, gastroparesis who presented with fast heart rate, chills, riders, fever to 104 Fahrenheit.  In the emergency department, she was mildly hypotensive with a systolic blood pressure of 93, lactic acid 2.68, white blood cell count 11.4. She had urinary tract infection on urinalysis. She was started on Rocephin IV fluids. She was also mildly hypoxic to 88% on room air a chest x-ray was concerning for left lower lobe pneumonia. Her d-dimer was mildly elevated at 1.15 so she underwent VQ scan which was negative for pulmonary embolism. Despite several liters of IV fluids she remained mildly hypotensive so she was admitted to the step down unit.  Hospital Course:   Sepsis due to Escherichia coli acute pyelonephritis.  She was started on IVF and ceftriaxone and urine culture grew E. Coli which was pan-sensitive.  She should continue keflex to complete a 10-day course.   Acute respiratory failure with hypoxia secondary to possible LLL CAP vs.  atelectasis.  She was started on ceftriaxone and azithromycin.  Blood culture x1: Negative to date.  Repeat chest x-ray demonstrated small bibasilar pleural effusions and minimal probable left basilar atelectasis.  VQ scan did not comment on ventilatory defect.  Her presentation was most consistent with pyelonephritis and she may have had some atelectasis.  Her azithromycin was discontinued and she remained without cough or SOB.  She should continue incentive spirometry.    Hypokalemia:  Resolved with oral repletion.   Anxiety and depression, stable.  Continued bupropion, Prozac (takes both and has been on these > 6 months) and when necessary and Xanax.   Anemia and thrombocytopenia, mild: Possibly dilutional. No reported bleeding. Stable.  Repeat as outpatient and if persistent, work up for anemia by PCP if not already complete.  Gastroparesis: Patient complains of nausea not relieved by Zofran. Stated that she takes Phenergan at home for same and her nausea improved after restarting and continuing PPI.    Overweight: BMI >25.  Counseling per PCP.  Headache: Since admission patient complained of mild frontal headache which was likely related to her sepsis.  Improved with tylenol.    Consultants:  None Procedures:  None Antibiotics:  IV Rocephin 9/6 >  IV Azithromycin 9/6 > 9/8   Discharge Exam: Filed Vitals:   01/17/14 0555  BP: 105/60  Pulse: 105  Temp: 99.2 F (37.3 C)  Resp: 16   Filed Vitals:   01/16/14 1224 01/16/14 1429 01/16/14 2051 01/17/14 0555  BP:  117/67 115/65 105/60  Pulse:  94 98 105  Temp: 99.6 F (37.6 C) 98.3 F (  36.8 C) 100.4 F (38 C) 99.2 F (37.3 C)  TempSrc:  Axillary Oral Oral  Resp:  20 18 16   Height:      Weight:      SpO2:  93% 95% 93%    General: Adult WF, NAD HEENT:  MMM, NCAT Cardiovascular: RRR, no mrg, 2+ pulses Respiratory: CTAB, no increased WOB ABD:  NABS, soft, ND/NT MSK:  No LEE, normal tone and bulk  Discharge  Instructions      Discharge Instructions   Call MD for:  difficulty breathing, headache or visual disturbances    Complete by:  As directed      Call MD for:  extreme fatigue    Complete by:  As directed      Call MD for:  hives    Complete by:  As directed      Call MD for:  persistant dizziness or light-headedness    Complete by:  As directed      Call MD for:  persistant nausea and vomiting    Complete by:  As directed      Call MD for:  severe uncontrolled pain    Complete by:  As directed      Call MD for:  temperature >100.4    Complete by:  As directed      Diet general    Complete by:  As directed      Discharge instructions    Complete by:  As directed   Please take keflex twice daily, next dose this evening, until all doses are complete.  If you have worsening fevers, chills, nausea, pain with urination, or copious watery diarrhea, please return to the hospital immediately.  Follow up with your primary care doctor in 1-2 weeks for repeat blood work.  I have given you a prescription for a probiotic called florastor which may potentially reduce the risk of infectious diarrhea.     Increase activity slowly    Complete by:  As directed             Medication List         acetaminophen 500 MG tablet  Commonly known as:  TYLENOL  Take 1,000 mg by mouth every 6 (six) hours as needed for mild pain.     ALPRAZolam 0.5 MG tablet  Commonly known as:  XANAX  Take 0.25-0.5 mg by mouth daily as needed for anxiety.     buPROPion 150 MG 24 hr tablet  Commonly known as:  WELLBUTRIN XL  Take 150 mg by mouth every morning.     cephALEXin 500 MG capsule  Commonly known as:  KEFLEX  Take 1 capsule (500 mg total) by mouth 2 (two) times daily.     dronabinol 2.5 MG capsule  Commonly known as:  MARINOL  Take 2.5 mg by mouth 2 (two) times daily as needed. For nausea     FLUoxetine 20 MG tablet  Commonly known as:  PROZAC  Take 20 mg by mouth every morning.      HYDROcodone-acetaminophen 5-325 MG per tablet  Commonly known as:  NORCO/VICODIN  Take 1 tablet by mouth every 4 (four) hours as needed for moderate pain. For pain     NON FORMULARY  Apply 1 application topically every 6 (six) hours as needed (for nausea).     prochlorperazine 10 MG tablet  Commonly known as:  COMPAZINE  Take 10 mg by mouth every 6 (six) hours as needed for nausea or vomiting.  rosuvastatin 20 MG tablet  Commonly known as:  CRESTOR  Take 20 mg by mouth daily.     saccharomyces boulardii 250 MG capsule  Commonly known as:  FLORASTOR  Take 1 capsule (250 mg total) by mouth 2 (two) times daily.     triamterene-hydrochlorothiazide 75-50 MG per tablet  Commonly known as:  MAXZIDE  Take 1 tablet by mouth daily as needed (for fluid in her ear).     zolpidem 10 MG tablet  Commonly known as:  AMBIEN  Take 10 mg by mouth at bedtime as needed for sleep.       Follow-up Information   Follow up with GATES,ROBERT NEVILL, MD. Schedule an appointment as soon as possible for a visit in 2 weeks.   Specialty:  Internal Medicine   Contact information:   [address] [place] [address] [phone]        The results of significant diagnostics from this hospitalization (including imaging, microbiology, ancillary and laboratory) are listed below for reference.    Significant Diagnostic Studies: Dg Chest 2 View  01/16/2014   CLINICAL DATA:  Shortness of breath today, followup LEFT lower lobe pneumonia  EXAM: CHEST  2 VIEW  COMPARISON:  01/14/2014  FINDINGS: Normal heart size and pulmonary vascularity.  LEFT to RIGHT tracheal deviation is decreased versus previous exam.  Tortuosity of thoracic aorta.  Mild persistent LEFT lower lobe opacity suspect mild atelectasis.  Small bibasilar effusions.  Remaining lungs clear.  No pneumothorax or acute osseous findings.  IMPRESSION: Small bibasilar pleural effusions and minimal probable LEFT basilar atelectasis.    Electronically Signed   By: Lavonia Dana M.D.   On: 01/16/2014 08:57   Dg Chest 2 View  01/14/2014   CLINICAL DATA:  Mild hypoxia.  Fever, headache, dizziness  EXAM: CHEST  2 VIEW  COMPARISON:  03/27/2013; 10/10/2009 ; 04/14/1999  FINDINGS: Grossly unchanged borderline enlarged cardiac silhouette and mediastinal contours with persistent leftward deviation of the tracheal air column at the level of the thoracic inlet. Interval development of ill-defined heterogeneous airspace opacities within the left lower lung. No pleural effusion or pneumothorax. There is unchanged diffuse thickening of the pulmonary interstitium. No unchanged bones. Post cholecystectomy.  IMPRESSION: 1. Findings worrisome for left lower lobe pneumonia superimposed on airways disease. A follow-up chest radiograph in 4 to 6 weeks after treatment is recommended to ensure resolution. 2. Persistent leftward deviation of the tracheal air column at the level thoracic inlet possibly accentuated due to decreased lung volumes and patient rotation, unchanged since the 2014 examination though new since the 2011 exam. Again, attention on follow-up is recommended.   Electronically Signed   By: Sandi Mariscal M.D.   On: 01/14/2014 13:02   Nm Pulmonary Perf And Vent  01/16/2014   CLINICAL DATA:  Hypoxia and elevated D-dimer. Rule out pulmonary embolism.  EXAM: NUCLEAR MEDICINE VENTILATION - PERFUSION LUNG SCAN  TECHNIQUE: Ventilation images were obtained in multiple projections using inhaled aerosol technetium 99 M DTPA. Perfusion images were obtained in multiple projections after intravenous injection of Tc-19m MAA.  RADIOPHARMACEUTICALS:  41.4 mCi Tc-61m DTPA aerosol and 5.2 mCi Tc-33m MAA  COMPARISON:  01/16/2014 chest radiograph  FINDINGS: Ventilation: No focal ventilation defect.  Perfusion: No wedge shaped peripheral perfusion defects to suggest acute pulmonary embolism.  IMPRESSION: No evidence of pulmonary embolism.   Electronically Signed   By: Abigail Miyamoto M.D.   On: 01/16/2014 14:09    Microbiology: Recent Results (from the past  240 hour(s))  URINE CULTURE     Status: None   Collection Time    01/14/14 10:40 AM      Result Value Ref Range Status   Culture ESCHERICHIA COLI   Final   Colony Count >=100,000 COLONIES/ML   Final   Organism ID, Bacteria ESCHERICHIA COLI   Final  URINE CULTURE     Status: None   Collection Time    01/14/14 11:12 AM      Result Value Ref Range Status   Specimen Description URINE, RANDOM   Final   Special Requests Normal   Final   Culture  Setup Time     Final   Value: 01/14/2014 18:56     Performed at [facility]     Final   Value: >=100,000 COLONIES/ML     Performed at [facility]   Culture     Final   Value: ESCHERICHIA COLI     Performed at [facility]   Report Status 01/16/2014 FINAL   Final   Organism ID, Bacteria ESCHERICHIA COLI   Final  CULTURE, BLOOD (ROUTINE X 2)     Status: None   Collection Time    01/14/14 12:21 PM      Result Value Ref Range Status   Specimen Description BLOOD LEFT ARM   Final   Special Requests BOTTLES DRAWN AEROBIC AND ANAEROBIC 3 CC   Final   Culture  Setup Time     Final   Value: 01/14/2014 19:17     Performed at [facility]   Culture     Final   Value:        BLOOD CULTURE RECEIVED NO GROWTH TO DATE CULTURE WILL BE HELD FOR 5 DAYS BEFORE ISSUING A FINAL NEGATIVE REPORT     Performed at [facility]   Report Status PENDING   Incomplete  MRSA PCR SCREENING     Status: None   Collection Time    01/14/14  5:36 PM      Result Value Ref Range Status   MRSA by PCR NEGATIVE  NEGATIVE Final   Comment:            The GeneXpert MRSA Assay (FDA     approved for NASAL specimens     only), is one component of a     comprehensive MRSA colonization     surveillance program. It is not     intended to diagnose MRSA     infection nor to guide or     monitor treatment for     MRSA infections.  CULTURE,  BLOOD (ROUTINE X 2)     Status: None   Collection Time    01/14/14  6:40 PM      Result Value Ref Range Status   Specimen Description BLOOD RIGHT ARM  10 ML IN [facility] BOTTLE   Final   Special Requests Normal   Final   Culture  Setup Time     Final   Value: 01/15/2014 00:27     Performed at [facility]   Culture     Final   Value:        BLOOD CULTURE RECEIVED NO GROWTH TO DATE CULTURE WILL BE HELD FOR 5 DAYS BEFORE ISSUING A FINAL NEGATIVE REPORT     Performed at [facility]   Report Status PENDING   Incomplete     Labs: Basic Metabolic Panel:  Recent  Labs Lab 01/14/14 1120 01/15/14 0358 01/16/14 0335  NA 137 142 140  K 3.3* 3.6* 4.2  CL 100 110 109  CO2 22 21 24   GLUCOSE 111* 100* 100*  BUN 11 8 4*  CREATININE 1.07 0.77 0.81  CALCIUM 8.8 7.0* 7.5*   Liver Function Tests:  Recent Labs Lab 01/14/14 1120  AST 13  ALT 15  ALKPHOS 62  BILITOT 0.9  PROT 6.9  ALBUMIN 3.3*   No results found for this basename: LIPASE, AMYLASE,  in the last 168 hours No results found for this basename: AMMONIA,  in the last 168 hours CBC:  Recent Labs Lab 01/14/14 1019 01/14/14 1120 01/15/14 0358 01/16/14 0335  WBC 11.4* 12.6* 11.6* 7.9  NEUTROABS  --  11.1*  --   --   HGB 12.9 13.1 10.2* 10.6*  HCT 39.2 39.9 31.5* 31.6*  MCV 91.3 90.5 92.9 90.8  PLT  --  208 PLATELET CLUMPS NOTED ON SMEAR, COUNT APPEARS ADEQUATE 143*   Cardiac Enzymes: No results found for this basename: CKTOTAL, CKMB, CKMBINDEX, TROPONINI,  in the last 168 hours BNP: BNP (last 3 results)  Recent Labs  01/14/14 1853  PROBNP 463.7*   CBG: No results found for this basename: GLUCAP,  in the last 168 hours  Time coordinating discharge: 35 minutes  Signed:  Boyde Grieco  [facility] 01/17/2014, 10:13 AM

## 2014-01-17 NOTE — Progress Notes (Signed)
Patient given discharge instructions, and verbalized an understanding of all discharge instructions.  Patient agrees with discharge plan, and is being discharged in stable medical condition.  Patient given transportation via wheelchair.  Latoyna Hird RN 

## 2014-01-20 LAB — CULTURE, BLOOD (ROUTINE X 2): CULTURE: NO GROWTH

## 2014-01-21 LAB — CULTURE, BLOOD (ROUTINE X 2)
Culture: NO GROWTH
SPECIAL REQUESTS: NORMAL

## 2014-03-12 ENCOUNTER — Encounter (HOSPITAL_COMMUNITY): Payer: Self-pay | Admitting: Emergency Medicine

## 2014-05-22 ENCOUNTER — Ambulatory Visit (INDEPENDENT_AMBULATORY_CARE_PROVIDER_SITE_OTHER): Payer: Self-pay

## 2014-05-22 DIAGNOSIS — F4323 Adjustment disorder with mixed anxiety and depressed mood: Secondary | ICD-10-CM

## 2014-06-06 ENCOUNTER — Encounter (HOSPITAL_COMMUNITY): Payer: Self-pay

## 2014-06-06 ENCOUNTER — Ambulatory Visit (INDEPENDENT_AMBULATORY_CARE_PROVIDER_SITE_OTHER): Payer: BLUE CROSS/BLUE SHIELD | Admitting: Emergency Medicine

## 2014-06-06 ENCOUNTER — Emergency Department (HOSPITAL_COMMUNITY)
Admission: EM | Admit: 2014-06-06 | Discharge: 2014-06-06 | Disposition: A | Discharge status: Home / Self Care | Payer: BLUE CROSS/BLUE SHIELD | Attending: Emergency Medicine | Admitting: Emergency Medicine

## 2014-06-06 ENCOUNTER — Emergency Department (HOSPITAL_COMMUNITY): Payer: BLUE CROSS/BLUE SHIELD

## 2014-06-06 VITALS — BP 108/76 | HR 117 | Temp 97.8°F | Resp 18

## 2014-06-06 DIAGNOSIS — R1011 Right upper quadrant pain: Secondary | ICD-10-CM

## 2014-06-06 DIAGNOSIS — F419 Anxiety disorder, unspecified: Secondary | ICD-10-CM | POA: Insufficient documentation

## 2014-06-06 DIAGNOSIS — F329 Major depressive disorder, single episode, unspecified: Secondary | ICD-10-CM | POA: Diagnosis not present

## 2014-06-06 DIAGNOSIS — G8929 Other chronic pain: Secondary | ICD-10-CM | POA: Diagnosis not present

## 2014-06-06 DIAGNOSIS — R109 Unspecified abdominal pain: Secondary | ICD-10-CM

## 2014-06-06 DIAGNOSIS — R112 Nausea with vomiting, unspecified: Secondary | ICD-10-CM

## 2014-06-06 DIAGNOSIS — K529 Noninfective gastroenteritis and colitis, unspecified: Secondary | ICD-10-CM | POA: Diagnosis not present

## 2014-06-06 DIAGNOSIS — Z8742 Personal history of other diseases of the female genital tract: Secondary | ICD-10-CM | POA: Insufficient documentation

## 2014-06-06 DIAGNOSIS — Z79899 Other long term (current) drug therapy: Secondary | ICD-10-CM | POA: Insufficient documentation

## 2014-06-06 DIAGNOSIS — R1012 Left upper quadrant pain: Secondary | ICD-10-CM

## 2014-06-06 DIAGNOSIS — E785 Hyperlipidemia, unspecified: Secondary | ICD-10-CM | POA: Insufficient documentation

## 2014-06-06 HISTORY — DX: Dorsalgia, unspecified: M54.9

## 2014-06-06 HISTORY — DX: Other chronic pain: G89.29

## 2014-06-06 LAB — BASIC METABOLIC PANEL WITH GFR
BUN: 17 mg/dL (ref 6–23)
CO2: 23 mEq/L (ref 19–32)
CREATININE: 0.77 mg/dL (ref 0.50–1.10)
Calcium: 9 mg/dL (ref 8.4–10.5)
Chloride: 105 mEq/L (ref 96–112)
GFR, Est African American: >89 mL/min
GFR, Est Non African American: 86 mL/min
Glucose, Bld: 104 mg/dL — ABNORMAL HIGH (ref 70–99)
Potassium: 4.1 mEq/L (ref 3.5–5.3)
SODIUM: 139 meq/L (ref 135–145)

## 2014-06-06 LAB — COMPREHENSIVE METABOLIC PANEL
ALK PHOS: 56 U/L (ref 39–117)
ALT: 30 U/L (ref 0–35)
ANION GAP: 7 (ref 5–15)
AST: 22 U/L (ref 0–37)
Albumin: 3.3 g/dL — ABNORMAL LOW (ref 3.5–5.2)
BUN: 17 mg/dL (ref 6–23)
CO2: 22 mmol/L (ref 19–32)
Calcium: 7.8 mg/dL — ABNORMAL LOW (ref 8.4–10.5)
Chloride: 112 mmol/L (ref 96–112)
Creatinine, Ser: 0.68 mg/dL (ref 0.50–1.10)
GFR calc Af Amer: >90 mL/min (ref >90)
Glucose, Bld: 99 mg/dL (ref 70–99)
Potassium: 3.7 mmol/L (ref 3.5–5.1)
SODIUM: 141 mmol/L (ref 135–145)
TOTAL PROTEIN: 6.1 g/dL (ref 6.0–8.3)
Total Bilirubin: 0.8 mg/dL (ref 0.3–1.2)

## 2014-06-06 LAB — CBC WITH DIFFERENTIAL/PLATELET
BASOS PCT: 0 % (ref 0–1)
Basophils Absolute: 0 10*3/uL (ref 0.0–0.1)
EOS ABS: 0 10*3/uL (ref 0.0–0.7)
Eosinophils Relative: 0 % (ref 0–5)
HEMATOCRIT: 41.8 % (ref 36.0–46.0)
Hemoglobin: 13.7 g/dL (ref 12.0–15.0)
LYMPHS ABS: 0.6 10*3/uL — AB (ref 0.7–4.0)
LYMPHS PCT: 6 % — AB (ref 12–46)
MCH: 29.8 pg (ref 26.0–34.0)
MCHC: 32.8 g/dL (ref 30.0–36.0)
MCV: 90.9 fL (ref 78.0–100.0)
MONOS PCT: 3 % (ref 3–12)
Monocytes Absolute: 0.3 10*3/uL (ref 0.1–1.0)
Neutro Abs: 9.1 10*3/uL — ABNORMAL HIGH (ref 1.7–7.7)
Neutrophils Relative %: 91 % — ABNORMAL HIGH (ref 43–77)
PLATELETS: 234 10*3/uL (ref 150–400)
RBC: 4.6 MIL/uL (ref 3.87–5.11)
RDW: 12.3 % (ref 11.5–15.5)
WBC: 10.1 10*3/uL (ref 4.0–10.5)

## 2014-06-06 LAB — URINALYSIS, ROUTINE W REFLEX MICROSCOPIC
BILIRUBIN URINE: NEGATIVE
GLUCOSE, UA: NEGATIVE mg/dL
Hgb urine dipstick: NEGATIVE
Ketones, ur: NEGATIVE mg/dL
Nitrite: NEGATIVE
PROTEIN: NEGATIVE mg/dL
Specific Gravity, Urine: 1.02 (ref 1.005–1.030)
Urobilinogen, UA: 0.2 mg/dL (ref 0.0–1.0)
pH: 6 (ref 5.0–8.0)

## 2014-06-06 LAB — POCT CBC
Granulocyte percent: 88.2 %G — AB (ref 37–80)
HCT, POC: 45.7 % (ref 37.7–47.9)
Hemoglobin: 14.8 g/dL (ref 12.2–16.2)
LYMPH, POC: 0.9 (ref 0.6–3.4)
MCH, POC: 29.4 pg (ref 27–31.2)
MCHC: 32.5 g/dL (ref 31.8–35.4)
MCV: 90.6 fL (ref 80–97)
MID (cbc): 0.5 (ref 0–0.9)
MPV: 7.1 fL (ref 0–99.8)
POC Granulocyte: 10.7 — AB (ref 2–6.9)
POC LYMPH PERCENT: 7.5 %L — AB (ref 10–50)
POC MID %: 4.3 % (ref 0–12)
Platelet Count, POC: 260 10*3/uL (ref 142–424)
RBC: 5.04 M/uL (ref 4.04–5.48)
RDW, POC: 12.8 %
WBC: 12.1 10*3/uL — AB (ref 4.6–10.2)

## 2014-06-06 LAB — URINE MICROSCOPIC-ADD ON

## 2014-06-06 LAB — I-STAT CHEM 8, ED
BUN: 17 mg/dL (ref 6–23)
CHLORIDE: 107 mmol/L (ref 96–112)
Calcium, Ion: 1.14 mmol/L (ref 1.12–1.23)
Creatinine, Ser: 0.8 mg/dL (ref 0.50–1.10)
GLUCOSE: 118 mg/dL — AB (ref 70–99)
HEMATOCRIT: 44 % (ref 36.0–46.0)
Hemoglobin: 15 g/dL (ref 12.0–15.0)
Potassium: 4.5 mmol/L (ref 3.5–5.1)
Sodium: 142 mmol/L (ref 135–145)
TCO2: 21 mmol/L (ref 0–100)

## 2014-06-06 LAB — I-STAT CG4 LACTIC ACID, ED: Lactic Acid, Venous: 1.33 mmol/L (ref 0.5–2.0)

## 2014-06-06 LAB — LIPASE, BLOOD: LIPASE: 28 U/L (ref 11–59)

## 2014-06-06 MED ORDER — PROMETHAZINE HCL 25 MG/ML IJ SOLN
12.5000 mg | Freq: Once | INTRAMUSCULAR | Status: AC
  Administered 2014-06-06: 12.5 mg via INTRAVENOUS
  Filled 2014-06-06: qty 1

## 2014-06-06 MED ORDER — HYDROCODONE-ACETAMINOPHEN 5-325 MG PO TABS: 1.0000 | ORAL_TABLET | ORAL | Status: DC | PRN

## 2014-06-06 MED ORDER — DIPHENOXYLATE-ATROPINE 2.5-0.025 MG PO TABS: 1.0000 | ORAL_TABLET | Freq: Four times a day (QID) | ORAL | Status: DC | PRN

## 2014-06-06 MED ORDER — PROMETHAZINE HCL 25 MG/ML IJ SOLN
25.0000 mg | Freq: Once | INTRAMUSCULAR | Status: AC
  Administered 2014-06-06: 25 mg via INTRAMUSCULAR

## 2014-06-06 MED ORDER — FENTANYL CITRATE 0.05 MG/ML IJ SOLN
50.0000 ug | INTRAMUSCULAR | Status: DC | PRN
  Administered 2014-06-06: 50 ug via INTRAVENOUS
  Filled 2014-06-06: qty 2

## 2014-06-06 MED ORDER — ONDANSETRON 4 MG PO TBDP: 4.0000 mg | ORAL_TABLET | Freq: Three times a day (TID) | ORAL | Status: DC | PRN

## 2014-06-06 NOTE — ED Notes (Signed)
She is comfortable in appearance.  When I remind her to drink her barium, she states "I cannot drink that contrast".

## 2014-06-06 NOTE — ED Provider Notes (Signed)
CSN: [id number]     Arrival date & time 06/06/14  1227 History   First MD Initiated Contact with Patient 06/06/14 1254     Chief Complaint  Patient presents with  . Emesis  . Flank Pain  . Abdominal Pain      HPI Pt was seen at 1315. Per pt, c/o gradual onset and persistence of multiple intermittent episodes of N/V that began this morning at 0600. Has been associated with generalized abd and low back "pain." Describes the pain as "aching." Pt was evaluated by her PMD PTA, then sent to the ED for further evaluation. Pt states she has previous hx of gastroparesis, as well as sepsis due to [facility] pyelonephritis, and she is concerned regarding both today. Denies CP/SOB, no fevers, no diarrhea, no dysuria/hematuria, no black or blood in stools or emesis.     Past Medical History  Diagnosis Date  . Interstitial cystitis 1992  . Hyperlipidemia   . Gastroparesis   . PONV (postoperative nausea and vomiting)   . Anxiety   . Depression   . Chronic back pain    Past Surgical History  Procedure Laterality Date  . Cesarean section  06-12-1986  . Eus N/A 06/29/2012    Procedure: ESOPHAGEAL ENDOSCOPIC ULTRASOUND (EUS) RADIAL;  Surgeon: Arta Silence, MD;  Location: [facility];  Service: Endoscopy;  Laterality: N/A;  . Fracture surgery  01/2011    R) foot  . Abdominal hysterectomy  1998    partial  . Cholecystectomy  apr 2014  . Eus N/A 10/04/2013    Procedure: ESOPHAGEAL ENDOSCOPIC ULTRASOUND (EUS) RADIAL;  Surgeon: Arta Silence, MD;  Location: [facility];  Service: Endoscopy;  Laterality: N/A;   Family History  Problem Relation Age of Onset  . Heart disease Father   . Heart attack Father    History  Substance Use Topics  . Smoking status: Never Smoker   . Smokeless tobacco: Never Used  . Alcohol Use: No   OB History    Gravida Para Term Preterm AB TAB SAB Ectopic Multiple Living   2 2 2       2      Review of Systems ROS: Statement: All systems negative except as marked or  noted in the HPI; Constitutional: Negative for fever and chills. ; ; Eyes: Negative for eye pain, redness and discharge. ; ; ENMT: Negative for ear pain, hoarseness, nasal congestion, sinus pressure and sore throat. ; ; Cardiovascular: Negative for chest pain, palpitations, diaphoresis, dyspnea and peripheral edema. ; ; Respiratory: Negative for cough, wheezing and stridor. ; ; Gastrointestinal: +N/V, abd pain. Negative for diarrhea, blood in stool, hematemesis, jaundice and rectal bleeding. . ; ; Genitourinary: Negative for dysuria, flank pain and hematuria. ; ; Musculoskeletal: +LBP. Negative for neck pain. Negative for swelling and trauma.; ; Skin: Negative for pruritus, rash, abrasions, blisters, bruising and skin lesion.; ; Neuro: Negative for headache, lightheadedness and neck stiffness. Negative for weakness, altered level of consciousness , altered mental status, extremity weakness, paresthesias, involuntary movement, seizure and syncope.       Allergies  Aspirin; Iohexol; and Nsaids  Home Medications   Prior to Admission medications   Medication Sig Start Date End Date Taking? Authorizing Provider  ALPRAZolam Duanne Moron) 0.5 MG tablet Take 0.25-0.5 mg by mouth daily as needed for anxiety.  12/21/11  Yes Historical Provider, MD  FLUoxetine (PROZAC) 10 MG capsule Take 10 mg by mouth 3 (three) times daily.   Yes Historical Provider, MD  HYDROcodone-acetaminophen (NORCO/VICODIN) 5-325  MG per tablet Take 1 tablet by mouth every 4 (four) hours as needed for moderate pain. For pain 12/28/11  Yes Historical Provider, MD  LORazepam (ATIVAN) 1 MG tablet Take 1 mg by mouth 3 (three) times daily as needed for anxiety.   Yes Historical Provider, MD  zolpidem (AMBIEN) 10 MG tablet Take 10 mg by mouth at bedtime as needed for sleep.    Yes Historical Provider, MD  buPROPion (WELLBUTRIN XL) 150 MG 24 hr tablet Take 150 mg by mouth every morning.    Historical Provider, MD  dronabinol (MARINOL) 2.5 MG capsule  Take 2.5 mg by mouth 2 (two) times daily as needed. For nausea    Historical Provider, MD  NON FORMULARY Apply 1 application topically every 6 (six) hours as needed (for nausea).    Historical Provider, MD  prochlorperazine (COMPAZINE) 10 MG tablet Take 10 mg by mouth every 6 (six) hours as needed for nausea or vomiting.    Historical Provider, MD  rosuvastatin (CRESTOR) 20 MG tablet Take 20 mg by mouth daily.    Historical Provider, MD  saccharomyces boulardii (FLORASTOR) 250 MG capsule Take 1 capsule (250 mg total) by mouth 2 (two) times daily. Patient not taking: Reported on 06/06/2014 01/17/14   Janece Canterbury, MD  triamterene-hydrochlorothiazide (MAXZIDE) 75-50 MG per tablet Take 1 tablet by mouth daily as needed (for fluid in her ear).  01/04/12   Historical Provider, MD   BP 93/55 mmHg  Pulse 100  Temp(Src) 98.3 F (36.8 C) (Oral)  Resp 14  SpO2 92% Physical Exam  1320: Physical examination:  Nursing notes reviewed; Vital signs and O2 SAT reviewed;  Constitutional: Well developed, Well nourished, Well hydrated, In no acute distress; Head:  Normocephalic, atraumatic; Eyes: EOMI, PERRL, No scleral icterus; ENMT: Mouth and pharynx normal, Mucous membranes moist; Neck: Supple, Full range of motion, No lymphadenopathy; Cardiovascular: Regular rate and rhythm, No murmur, rub, or gallop; Respiratory: Breath sounds clear & equal bilaterally, No rales, rhonchi, wheezes.  Speaking full sentences with ease, Normal respiratory effort/excursion; Chest: Nontender, Movement normal; Abdomen: Soft, +mild diffuse tenderness to palp. No rebound or guarding. Nondistended, Normal bowel sounds; Genitourinary: No CVA tenderness; Spine:  No midline CS, TS, LS tenderness. +mild TTP bilat lumbar paraspinal muscles.;; Extremities: Pulses normal, No tenderness, No edema, No calf edema or asymmetry.; Neuro: AA&Ox3, Major CN grossly intact.  Speech clear. No gross focal motor or sensory deficits in extremities.; Skin: Color  normal, Warm, Dry.   ED Course  Procedures     EKG Interpretation None      MDM  MDM Reviewed: previous chart, nursing note and vitals Reviewed previous: labs Interpretation: labs   Results for orders placed or performed during the hospital encounter of 06/06/14  Comprehensive metabolic panel  Result Value Ref Range   Sodium 141 135 - 145 mmol/L   Potassium 3.7 3.5 - 5.1 mmol/L   Chloride 112 96 - 112 mmol/L   CO2 22 19 - 32 mmol/L   Glucose, Bld 99 70 - 99 mg/dL   BUN 17 6 - 23 mg/dL   Creatinine, Ser 0.68 0.50 - 1.10 mg/dL   Calcium 7.8 (L) 8.4 - 10.5 mg/dL   Total Protein 6.1 6.0 - 8.3 g/dL   Albumin 3.3 (L) 3.5 - 5.2 g/dL   AST 22 0 - 37 U/L   ALT 30 0 - 35 U/L   Alkaline Phosphatase 56 39 - 117 U/L   Total Bilirubin 0.8 0.3 - 1.2 mg/dL  GFR calc non Af Amer >90 >90 mL/min   GFR calc Af Amer >90 >90 mL/min   Anion gap 7 5 - 15  Lipase, blood  Result Value Ref Range   Lipase 28 11 - 59 U/L  CBC with Differential  Result Value Ref Range   WBC 10.1 4.0 - 10.5 K/uL   RBC 4.60 3.87 - 5.11 MIL/uL   Hemoglobin 13.7 12.0 - 15.0 g/dL   HCT 41.8 36.0 - 46.0 %   MCV 90.9 78.0 - 100.0 fL   MCH 29.8 26.0 - 34.0 pg   MCHC 32.8 30.0 - 36.0 g/dL   RDW 12.3 11.5 - 15.5 %   Platelets 234 150 - 400 K/uL   Neutrophils Relative % 91 (H) 43 - 77 %   Neutro Abs 9.1 (H) 1.7 - 7.7 K/uL   Lymphocytes Relative 6 (L) 12 - 46 %   Lymphs Abs 0.6 (L) 0.7 - 4.0 K/uL   Monocytes Relative 3 3 - 12 %   Monocytes Absolute 0.3 0.1 - 1.0 K/uL   Eosinophils Relative 0 0 - 5 %   Eosinophils Absolute 0.0 0.0 - 0.7 K/uL   Basophils Relative 0 0 - 1 %   Basophils Absolute 0.0 0.0 - 0.1 K/uL  Urinalysis, Routine w reflex microscopic  Result Value Ref Range   Color, Urine YELLOW YELLOW   APPearance CLEAR CLEAR   Specific Gravity, Urine 1.020 1.005 - 1.030   pH 6.0 5.0 - 8.0   Glucose, UA NEGATIVE NEGATIVE mg/dL   Hgb urine dipstick NEGATIVE NEGATIVE   Bilirubin Urine NEGATIVE NEGATIVE    Ketones, ur NEGATIVE NEGATIVE mg/dL   Protein, ur NEGATIVE NEGATIVE mg/dL   Urobilinogen, UA 0.2 0.0 - 1.0 mg/dL   Nitrite NEGATIVE NEGATIVE   Leukocytes, UA SMALL (A) NEGATIVE  Urine microscopic-add on  Result Value Ref Range   Squamous Epithelial / LPF RARE RARE   Bacteria, UA RARE RARE   Urine-Other RARE YEAST   I-Stat CG4 Lactic Acid, ED  Result Value Ref Range   Lactic Acid, Venous 1.33 0.5 - 2.0 mmol/L  I-stat Chem 8, ED  Result Value Ref Range   Sodium 142 135 - 145 mmol/L   Potassium 4.5 3.5 - 5.1 mmol/L   Chloride 107 96 - 112 mmol/L   BUN 17 6 - 23 mg/dL   Creatinine, Ser 0.80 0.50 - 1.10 mg/dL   Glucose, Bld 118 (H) 70 - 99 mg/dL   Calcium, Ion 1.14 1.12 - 1.23 mmol/L   TCO2 21 0 - 100 mmol/L   Hemoglobin 15.0 12.0 - 15.0 g/dL   HCT 44.0 36.0 - 46.0 %    1600:  Workup reassuring. CT A/P pending. Sign out to Dr. Jeneen Rinks.      Francine Graven, DO 06/06/14 1617

## 2014-06-06 NOTE — ED Notes (Signed)
She c/o emesis x ~7 today beginning at 0600 hours.  She also c/o bilat. Flank pain.  She cites previous hospitalization for u.t.i.  She is pale, with her skin being warm and dry and she is in no distress.

## 2014-06-06 NOTE — ED Provider Notes (Addendum)
Patient discussed with Dr. Thurnell Garbe. Patient's care was assumed. I reexamined her. I discussed her studies including CT scan with her. No sign of acute surgical condition or apparent supperative infection per CT. Exam shows a benign abdomen with present bowel sounds. She states she feels better. Intermittent episodes of cramps. Taking some by mouth liquids here. She is discharged home. Careful instructions return with any worsening symptoms including worsening pain or bloody stools. Plan symptomatic treatment with limited number of 10 Vicodin, Zofran for nausea, Lomotil. Return precautions as above.  Instructed to be on clear liquids only tonight. Slowly advance your diet tomorrow.  Tanna Furry, MD 06/06/14 Greer Ee  Tanna Furry, MD 06/06/14 [phone]

## 2014-06-06 NOTE — Discharge Instructions (Signed)
Clear liquids only for the next 24 hours. As you advance your diet, only small frequent meals. Indications as prescribed, as needed. Return here for any worsening symptoms, worsening pain, or blood in your stools.  Abdominal Pain Many things can cause abdominal pain. Usually, abdominal pain is not caused by a disease and will improve without treatment. It can often be observed and treated at home. Your health care provider will do a physical exam and possibly order blood tests and X-rays to help determine the seriousness of your pain. However, in many cases, more time must pass before a clear cause of the pain can be found. Before that point, your health care provider may not know if you need more testing or further treatment. HOME CARE INSTRUCTIONS  Monitor your abdominal pain for any changes. The following actions may help to alleviate any discomfort you are experiencing:  Only take over-the-counter or prescription medicines as directed by your health care provider.  Do not take laxatives unless directed to do so by your health care provider.  Try a clear liquid diet (broth, tea, or water) as directed by your health care provider. Slowly move to a bland diet as tolerated. SEEK MEDICAL CARE IF:  You have unexplained abdominal pain.  You have abdominal pain associated with nausea or diarrhea.  You have pain when you urinate or have a bowel movement.  You experience abdominal pain that wakes you in the night.  You have abdominal pain that is worsened or improved by eating food.  You have abdominal pain that is worsened with eating fatty foods.  You have a fever. SEEK IMMEDIATE MEDICAL CARE IF:   Your pain does not go away within 2 hours.  You keep throwing up (vomiting).  Your pain is felt only in portions of the abdomen, such as the right side or the left lower portion of the abdomen.  You pass bloody or black tarry stools. MAKE SURE YOU:  Understand these instructions.    Will watch your condition.   Will get help right away if you are not doing well or get worse.  Document Released: 02/04/2005 Document Revised: 05/02/2013 Document Reviewed: 01/04/2013 [facility] Patient Information 2015 [place], [place]. This information is not intended to replace advice given to you by your health care provider. Make sure you discuss any questions you have with your health care provider.  Viral Gastroenteritis Viral gastroenteritis is also called stomach flu. This illness is caused by a certain type of germ (virus). It can cause sudden watery poop (diarrhea) and throwing up (vomiting). This can cause you to lose body fluids (dehydration). This illness usually lasts for 3 to 8 days. It usually goes away on its own. HOME CARE   Drink enough fluids to keep your pee (urine) clear or pale yellow. Drink small amounts of fluids often.  Ask your doctor how to replace body fluid losses (rehydration).  Avoid:  Foods high in sugar.  Alcohol.  Bubbly (carbonated) drinks.  Tobacco.  Juice.  Caffeine drinks.  Very hot or cold fluids.  Fatty, greasy foods.  Eating too much at one time.  Dairy products until 24 to 48 hours after your watery poop stops.  You may eat foods with active cultures (probiotics). They can be found in some yogurts and supplements.  Wash your hands well to avoid spreading the illness.  Only take medicines as told by your doctor. Do not give aspirin to children. Do not take medicines for watery poop (antidiarrheals).  Ask your  doctor if you should keep taking your regular medicines.  Keep all doctor visits as told. GET HELP RIGHT AWAY IF:   You cannot keep fluids down.  You do not pee at least once every 6 to 8 hours.  You are short of breath.  You see blood in your poop or throw up. This may look like coffee grounds.  You have belly (abdominal) pain that gets worse or is just in one small spot (localized).  You keep throwing up or  having watery poop.  You have a fever.  The patient is a child younger than 3 months, and he or she has a fever.  The patient is a child older than 3 months, and he or she has a fever and problems that do not go away.  The patient is a child older than 3 months, and he or she has a fever and problems that suddenly get worse.  The patient is a baby, and he or she has no tears when crying. MAKE SURE YOU:   Understand these instructions.  Will watch your condition.  Will get help right away if you are not doing well or get worse. Document Released: 10/14/2007 Document Revised: 07/20/2011 Document Reviewed: 02/11/2011 [facility] Patient Information 2015 [place], [place]. This information is not intended to replace advice given to you by your health care provider. Make sure you discuss any questions you have with your health care provider.

## 2014-06-06 NOTE — Progress Notes (Addendum)
Subjective:    Patient ID: Olivia Barker, female    DOB: 1955/08/05, 59 y.o.   MRN: [id number] This chart was scribed for Arlyss Queen, MD by Marti Sleigh, Medical Scribe. This patient was seen in Room 12 and the patient's care was started at 11:19 AM.  Chief Complaint  Patient presents with  . Emesis    started this morning  . Nausea    HPI HPI Comments: Olivia Barker is a 59 y.o. female with a hx of partial hysterectomy, GERD, and c-section who presents to [facility] complaining of vomiting with associated nausea, and severe abdominal pain that started at 6-7am this morning. Pt also endorses associated diarrhea this morning (four events). Pt denies fever. Pt states that her sx started with nausea, and rapidly progressed to severe vomiting and severe abdominal pain. Pt has been diagnosed with Gastroparesis. Pt states she was diagnosed with a kidney infection last year and had similar sx. Pt states she has had her gall bladder removed.   Pt ate sushi last night, at 6pm.    Review of Systems  Constitutional: Negative for fever and chills.  Cardiovascular: Negative for chest pain.  Gastrointestinal: Positive for nausea, vomiting, abdominal pain and diarrhea.  Neurological: Positive for weakness.       Objective:   Physical Exam  Constitutional: She is oriented to person, place, and time. She appears well-developed and well-nourished.  Pt appears accutely ill, but not toxic.   HENT:  Head: Normocephalic and atraumatic.  Eyes: Pupils are equal, round, and reactive to light.  Neck: Neck supple.  Cardiovascular: Normal rate and regular rhythm.   Pulmonary/Chest: Effort normal and breath sounds normal. No respiratory distress.  Abdominal: Soft. There is tenderness. There is no rebound.  Left CVA tenderness, and significant mid epigastric tenderness. Multiple surgical scars.  Neurological: She is alert and oriented to person, place, and time.  Skin: Skin is warm and dry.  Psychiatric: She  has a normal mood and affect. Her behavior is normal.  Nursing note and vitals reviewed.  Results for orders placed or performed in visit on 06/06/14  POCT CBC  Result Value Ref Range   WBC 12.1 (A) 4.6 - 10.2 K/uL   Lymph, poc 0.9 0.6 - 3.4   POC LYMPH PERCENT 7.5 (A) 10 - 50 %L   MID (cbc) 0.5 0 - 0.9   POC MID % 4.3 0 - 12 %M   POC Granulocyte 10.7 (A) 2 - 6.9   Granulocyte percent 88.2 (A) 37 - 80 %G   RBC 5.04 4.04 - 5.48 M/uL   Hemoglobin 14.8 12.2 - 16.2 g/dL   HCT, POC 45.7 37.7 - 47.9 %   MCV 90.6 80 - 97 fL   MCH, POC 29.4 27 - 31.2 pg   MCHC 32.5 31.8 - 35.4 g/dL   RDW, POC 12.8 %   Platelet Count, POC 260 142 - 424 K/uL   MPV 7.1 0 - 99.8 fL       Assessment & Plan:  Patient presents with severe flank and abdominal pain with vomiting and diarrhea. She has a history of sepsis 01/17/2014 secondary to Escherichia coli. She developed hypoxia temperatures to 104 during that hospitalization associated with an elevated lactic acid. Patient given 25 of Phenergan in the office IV fluids started will transport to the hospital for their evaluation.I personally performed the services described in this documentation, which was scribed in my presence. The recorded information has been reviewed and is accurate.

## 2014-06-06 NOTE — ED Notes (Signed)
1330 pt is aware of urine sample would prefer to use bathroom but is unable to at this moment, I told her I would return and that catheter was ordered.

## 2014-06-06 NOTE — ED Notes (Signed)
Bed: RESB Expected date:  Expected time:  Means of arrival:  Comments: 59 y/o F abd pain (in expected)

## 2014-06-08 LAB — URINE CULTURE

## 2014-06-09 NOTE — Progress Notes (Signed)
ED Antimicrobial Stewardship Positive Culture Follow Up   Olivia Barker is an 59 y.o. female who presented to [facility] on 06/06/2014 with a chief complaint of N/V, abd/back pain Chief Complaint  Patient presents with  . Emesis  . Flank Pain  . Abdominal Pain    Recent Results (from the past 720 hour(s))  Urine culture     Status: None   Collection Time: 06/06/14  2:09 PM  Result Value Ref Range Status   Specimen Description URINE, CATHETERIZED  Final   Special Requests NONE  Final   Colony Count   Final    >=100,000 COLONIES/ML Performed at [facility]    Culture   Final    STAPHYLOCOCCUS SPECIES (COAGULASE NEGATIVE) Note: RIFAMPIN AND GENTAMICIN SHOULD NOT BE USED AS SINGLE DRUGS FOR TREATMENT OF STAPH INFECTIONS. Performed at [facility]    Report Status 06/08/2014 FINAL  Final   Organism ID, Bacteria STAPHYLOCOCCUS SPECIES (COAGULASE NEGATIVE)  Final      Susceptibility   Staphylococcus species (coagulase negative) - MIC*    GENTAMICIN <=0.5 SENSITIVE Sensitive     LEVOFLOXACIN 0.25 SENSITIVE Sensitive     NITROFURANTOIN <=16 SENSITIVE Sensitive     OXACILLIN >=4 RESISTANT Resistant     PENICILLIN >=0.5 RESISTANT Resistant     RIFAMPIN <=0.5 SENSITIVE Sensitive     TRIMETH/SULFA <=10 SENSITIVE Sensitive     VANCOMYCIN 2 SENSITIVE Sensitive     TETRACYCLINE >=16 RESISTANT Resistant     * STAPHYLOCOCCUS SPECIES (COAGULASE NEGATIVE)    [x]  No treatment needed  34 YOM who presented with N/V, abd/back pain/aching. CT was negative for infection, UA also negative. UCx grew Coag negative staph - likely colonizer or contaminant and not a true pathogen.   New antibiotic prescription: No treatment needed  ED Provider: Carlisle Cater, PA-C   Lawson Radar 06/09/2014, 3:20 PM Infectious Diseases Pharmacist Phone# [phone]

## 2014-06-10 ENCOUNTER — Telehealth (HOSPITAL_COMMUNITY): Payer: Self-pay

## 2014-06-10 NOTE — Telephone Encounter (Signed)
Post ED Visit - Positive Culture Follow-up  Culture report reviewed by antimicrobial stewardship pharmacist: []  Wes Dulaney, Pharm.D., BCPS []  Heide Guile, Pharm.D., BCPS [x]  Alycia Rossetti, Pharm.D., BCPS []  [place], Pharm.D., BCPS, AAHIVP []  Legrand Como, Pharm.D., BCPS, AAHIVP []  Isac Sarna, Pharm.D., BCPS  Positive Urine culture, >/= 100,000 colonies -> Staph. Species Likely contaminant, no further patient follow-up is required at this time.  Dortha Kern 06/10/2014, 11:17 PM

## 2014-12-11 ENCOUNTER — Other Ambulatory Visit (HOSPITAL_COMMUNITY): Payer: Self-pay

## 2014-12-11 ENCOUNTER — Encounter (HOSPITAL_COMMUNITY): Payer: Self-pay | Admitting: *Deleted

## 2014-12-11 ENCOUNTER — Emergency Department (HOSPITAL_COMMUNITY)
Admission: EM | Admit: 2014-12-11 | Discharge: 2014-12-11 | Discharge status: Against Medical Advice | Payer: BLUE CROSS/BLUE SHIELD | Attending: Emergency Medicine | Admitting: Emergency Medicine

## 2014-12-11 ENCOUNTER — Ambulatory Visit (INDEPENDENT_AMBULATORY_CARE_PROVIDER_SITE_OTHER): Payer: BLUE CROSS/BLUE SHIELD | Admitting: Family Medicine

## 2014-12-11 VITALS — BP 124/80 | HR 97 | Temp 97.7°F | Resp 17 | Ht 62.0 in | Wt 161.0 lb

## 2014-12-11 DIAGNOSIS — Z87448 Personal history of other diseases of urinary system: Secondary | ICD-10-CM | POA: Diagnosis not present

## 2014-12-11 DIAGNOSIS — R1032 Left lower quadrant pain: Secondary | ICD-10-CM

## 2014-12-11 DIAGNOSIS — G8929 Other chronic pain: Secondary | ICD-10-CM | POA: Insufficient documentation

## 2014-12-11 DIAGNOSIS — K30 Functional dyspepsia: Secondary | ICD-10-CM | POA: Diagnosis not present

## 2014-12-11 DIAGNOSIS — D72829 Elevated white blood cell count, unspecified: Secondary | ICD-10-CM | POA: Diagnosis not present

## 2014-12-11 DIAGNOSIS — Z8719 Personal history of other diseases of the digestive system: Secondary | ICD-10-CM | POA: Diagnosis not present

## 2014-12-11 DIAGNOSIS — Z90710 Acquired absence of both cervix and uterus: Secondary | ICD-10-CM | POA: Insufficient documentation

## 2014-12-11 DIAGNOSIS — E785 Hyperlipidemia, unspecified: Secondary | ICD-10-CM | POA: Insufficient documentation

## 2014-12-11 DIAGNOSIS — R110 Nausea: Secondary | ICD-10-CM

## 2014-12-11 DIAGNOSIS — Z9049 Acquired absence of other specified parts of digestive tract: Secondary | ICD-10-CM | POA: Diagnosis not present

## 2014-12-11 DIAGNOSIS — F419 Anxiety disorder, unspecified: Secondary | ICD-10-CM | POA: Diagnosis not present

## 2014-12-11 DIAGNOSIS — F329 Major depressive disorder, single episode, unspecified: Secondary | ICD-10-CM | POA: Diagnosis not present

## 2014-12-11 DIAGNOSIS — Z79899 Other long term (current) drug therapy: Secondary | ICD-10-CM | POA: Insufficient documentation

## 2014-12-11 DIAGNOSIS — R109 Unspecified abdominal pain: Secondary | ICD-10-CM

## 2014-12-11 DIAGNOSIS — R112 Nausea with vomiting, unspecified: Secondary | ICD-10-CM | POA: Insufficient documentation

## 2014-12-11 LAB — COMPREHENSIVE METABOLIC PANEL
ALT: 30 U/L (ref 14–54)
AST: 23 U/L (ref 15–41)
Albumin: 4.1 g/dL (ref 3.5–5.0)
Alkaline Phosphatase: 64 U/L (ref 38–126)
Anion gap: 8 (ref 5–15)
BUN: 17 mg/dL (ref 6–20)
CHLORIDE: 107 mmol/L (ref 101–111)
CO2: 26 mmol/L (ref 22–32)
CREATININE: 0.9 mg/dL (ref 0.44–1.00)
Calcium: 8.7 mg/dL — ABNORMAL LOW (ref 8.9–10.3)
GFR calc non Af Amer: >60 mL/min (ref >60)
Glucose, Bld: 92 mg/dL (ref 65–99)
POTASSIUM: 3.7 mmol/L (ref 3.5–5.1)
SODIUM: 141 mmol/L (ref 135–145)
TOTAL PROTEIN: 7.2 g/dL (ref 6.5–8.1)
Total Bilirubin: 0.7 mg/dL (ref 0.3–1.2)

## 2014-12-11 LAB — COMPLETE METABOLIC PANEL WITH GFR
ALT: 29 U/L (ref 6–29)
AST: 20 U/L (ref 10–35)
Albumin: 4.2 g/dL (ref 3.6–5.1)
Alkaline Phosphatase: 61 U/L (ref 33–130)
BILIRUBIN TOTAL: 0.6 mg/dL (ref 0.2–1.2)
BUN: 18 mg/dL (ref 7–25)
CO2: 28 mmol/L (ref 20–31)
CREATININE: 0.92 mg/dL (ref 0.50–1.05)
Calcium: 9.3 mg/dL (ref 8.6–10.4)
Chloride: 101 mmol/L (ref 98–110)
GFR, EST NON AFRICAN AMERICAN: 69 mL/min (ref >=60)
GFR, Est African American: 79 mL/min (ref >=60)
GLUCOSE: 93 mg/dL (ref 65–99)
Potassium: 4.1 mmol/L (ref 3.5–5.3)
Sodium: 140 mmol/L (ref 135–146)
TOTAL PROTEIN: 6.9 g/dL (ref 6.1–8.1)

## 2014-12-11 LAB — POCT CBC
Granulocyte percent: 82.8 %G — AB (ref 37–80)
HCT, POC: 45.3 % (ref 37.7–47.9)
Hemoglobin: 14.9 g/dL (ref 12.2–16.2)
Lymph, poc: 2.1 (ref 0.6–3.4)
MCH, POC: 30 pg (ref 27–31.2)
MCHC: 32.9 g/dL (ref 31.8–35.4)
MCV: 91.2 fL (ref 80–97)
MID (CBC): 0.8 (ref 0–0.9)
MPV: 7.4 fL (ref 0–99.8)
PLATELET COUNT, POC: 292 10*3/uL (ref 142–424)
POC Granulocyte: 13.7 — AB (ref 2–6.9)
POC LYMPH %: 12.6 % (ref 10–50)
POC MID %: 4.6 %M (ref 0–12)
RBC: 4.96 M/uL (ref 4.04–5.48)
RDW, POC: 13.3 %
WBC: 16.5 10*3/uL — AB (ref 4.6–10.2)

## 2014-12-11 LAB — URINALYSIS, ROUTINE W REFLEX MICROSCOPIC
Bilirubin Urine: NEGATIVE
Glucose, UA: NEGATIVE mg/dL
Hgb urine dipstick: NEGATIVE
Ketones, ur: NEGATIVE mg/dL
Leukocytes, UA: NEGATIVE
NITRITE: NEGATIVE
PROTEIN: NEGATIVE mg/dL
SPECIFIC GRAVITY, URINE: 1.006 (ref 1.005–1.030)
Urobilinogen, UA: 0.2 mg/dL (ref 0.0–1.0)
pH: 7 (ref 5.0–8.0)

## 2014-12-11 LAB — POCT URINALYSIS DIPSTICK
Bilirubin, UA: NEGATIVE
Blood, UA: NEGATIVE
Glucose, UA: NEGATIVE
KETONES UA: NEGATIVE
Nitrite, UA: NEGATIVE
PH UA: 7
PROTEIN UA: NEGATIVE
Spec Grav, UA: 1.02
Urobilinogen, UA: 0.2

## 2014-12-11 LAB — CBC WITH DIFFERENTIAL/PLATELET
BASOS ABS: 0 10*3/uL (ref 0.0–0.1)
Basophils Relative: 0 % (ref 0–1)
EOS ABS: 0.2 10*3/uL (ref 0.0–0.7)
Eosinophils Relative: 1 % (ref 0–5)
HCT: 42.2 % (ref 36.0–46.0)
Hemoglobin: 14.2 g/dL (ref 12.0–15.0)
LYMPHS ABS: 2 10*3/uL (ref 0.7–4.0)
Lymphocytes Relative: 14 % (ref 12–46)
MCH: 30.7 pg (ref 26.0–34.0)
MCHC: 33.6 g/dL (ref 30.0–36.0)
MCV: 91.3 fL (ref 78.0–100.0)
MONO ABS: 0.8 10*3/uL (ref 0.1–1.0)
Monocytes Relative: 5 % (ref 3–12)
NEUTROS PCT: 80 % — AB (ref 43–77)
Neutro Abs: 11.3 10*3/uL — ABNORMAL HIGH (ref 1.7–7.7)
PLATELETS: 257 10*3/uL (ref 150–400)
RBC: 4.62 MIL/uL (ref 3.87–5.11)
RDW: 12 % (ref 11.5–15.5)
WBC: 14.2 10*3/uL — AB (ref 4.0–10.5)

## 2014-12-11 LAB — LIPASE, BLOOD: Lipase: 31 U/L (ref 22–51)

## 2014-12-11 LAB — I-STAT CG4 LACTIC ACID, ED: Lactic Acid, Venous: 1.39 mmol/L (ref 0.5–2.0)

## 2014-12-11 MED ORDER — SODIUM CHLORIDE 0.9 % IV BOLUS (SEPSIS)
1000.0000 mL | Freq: Once | INTRAVENOUS | Status: AC
  Administered 2014-12-11: 1000 mL via INTRAVENOUS

## 2014-12-11 MED ORDER — PROMETHAZINE HCL 25 MG/ML IJ SOLN
25.0000 mg | Freq: Once | INTRAMUSCULAR | Status: AC
  Administered 2014-12-11: 25 mg via INTRAMUSCULAR

## 2014-12-11 MED ORDER — GI COCKTAIL ~~LOC~~: 10.0000 mL | Freq: Once | ORAL | Status: DC

## 2014-12-11 NOTE — ED Notes (Signed)
EMS contacted by patients M.D. Office d/t increased abdominal pain. Tender LLQ. WBC's were elevated.  Patient appears lethargic at this time, phenergan 25 mg given IV. Pt is A&Ox4, has hx gastroparesis.

## 2014-12-11 NOTE — ED Notes (Signed)
Bed: WA06 Expected date:  Expected time:  Means of arrival:  Comments: EMS/68F/abd pain/from PCP

## 2014-12-11 NOTE — Progress Notes (Signed)
Patient examined and discussed with Philis Fendt, PA-C. History of gastroparesis, and discussed with her and friends in room recent history and presented symptoms. Some discomfort or abdominal symptoms late last week, but increased stress recently which usually is a trigger for her gastroparesis. She started with increasing abdominal pain past 2-3 days,  nausea and vomiting today. Called her gastroenterologist who recommended evaluation in our office with possible Phenergan injection.  She had taken Zofran a few hours ago, without relief, Phenergan 25 mg injection given here.  She is very sedated during her exam with me, but I am able to awaken her to where she is responding appropriately to questions. On my exam she is tender left lower quadrant, and localizes this area on multiple repeat exams. Leukocytosis with left shift noted, and with pain left lower quadrant, I'm concerned about a possible diverticulitis. This is not seen to be the typical location of a gastroparesis.  Discussed CT scanning tonight which she initially refused then agreed to after further discussions. She is very sedated at this time but does respond to tactile stimuli, for this reason will have her transported by EMS for evaluation emergency room. See other details in [employer] note.

## 2014-12-11 NOTE — ED Notes (Signed)
Pt states she wants to leave, adding that she has had multiple CT scans and blood work with same outcome. She states she feels better.

## 2014-12-11 NOTE — ED Provider Notes (Signed)
CSN: [id number]     Arrival date & time 12/11/14  1848 History   First MD Initiated Contact with Patient 12/11/14 1920     Chief Complaint  Patient presents with  . Abdominal Pain     (Consider location/radiation/quality/duration/timing/severity/associated sxs/prior Treatment) HPI Olivia Barker is a 59 y.o. female  3 with history of interstitial cystitis, gastroparesis, anxiety depression, presents to emergency department complaining of nausea and vomiting. Patient states symptoms started approximately 3 hours ago. States that unable to keep nausea vomiting under control at home. She went to a urgent care office where she had CBC done which showed elevated white count of 16. She was given IM injection of Phenergan 25 mg and sent here for further evaluation. On their examination patient was found to have left lower quadrant tenderness. Patient states her symptoms feel just like prior gastroparesis exacerbations. She denies any fever or chills. No diarrhea. No urinary symptoms. No other complaints.   Past Medical History  Diagnosis Date  . Interstitial cystitis 1992  . Hyperlipidemia   . Gastroparesis   . PONV (postoperative nausea and vomiting)   . Anxiety   . Depression   . Chronic back pain    Past Surgical History  Procedure Laterality Date  . Cesarean section  06-12-1986  . Eus N/A 06/29/2012    Procedure: ESOPHAGEAL ENDOSCOPIC ULTRASOUND (EUS) RADIAL;  Surgeon: Arta Silence, MD;  Location: [facility];  Service: Endoscopy;  Laterality: N/A;  . Fracture surgery  01/2011    R) foot  . Abdominal hysterectomy  1998    partial  . Cholecystectomy  apr 2014  . Eus N/A 10/04/2013    Procedure: ESOPHAGEAL ENDOSCOPIC ULTRASOUND (EUS) RADIAL;  Surgeon: Arta Silence, MD;  Location: [facility];  Service: Endoscopy;  Laterality: N/A;   Family History  Problem Relation Age of Onset  . Heart disease Father   . Heart attack Father    History  Substance Use Topics  . Smoking status:  Never Smoker   . Smokeless tobacco: Never Used  . Alcohol Use: No   OB History    Gravida Para Term Preterm AB TAB SAB Ectopic Multiple Living   2 2 2       2      Review of Systems  Constitutional: Negative for fever and chills.  Respiratory: Negative for cough, chest tightness and shortness of breath.   Cardiovascular: Negative for chest pain, palpitations and leg swelling.  Gastrointestinal: Positive for nausea and vomiting. Negative for abdominal pain, diarrhea, constipation and blood in stool.  Genitourinary: Negative for dysuria, flank pain and pelvic pain.  Musculoskeletal: Negative for myalgias, arthralgias, neck pain and neck stiffness.  Skin: Negative for rash.  Neurological: Negative for dizziness, weakness and headaches.  All other systems reviewed and are negative.     Allergies  Aspirin; Iohexol; and Nsaids  Home Medications   Prior to Admission medications   Medication Sig Start Date End Date Taking? Authorizing Provider  ALPRAZolam Duanne Moron) 0.5 MG tablet Take 0.25-0.5 mg by mouth daily as needed for anxiety.  12/21/11  Yes Historical Provider, MD  buPROPion (WELLBUTRIN XL) 150 MG 24 hr tablet Take 150 mg by mouth every morning.   Yes Historical Provider, MD  dronabinol (MARINOL) 2.5 MG capsule Take 2.5 mg by mouth 2 (two) times daily as needed. For nausea   Yes Historical Provider, MD  FLUoxetine (PROZAC) 10 MG capsule Take 10 mg by mouth daily.    Yes Historical Provider, MD  HYDROcodone-acetaminophen (NORCO/VICODIN)  5-325 MG per tablet Take 1 tablet by mouth every 4 (four) hours as needed. 06/06/14  Yes Tanna Furry, MD  LORazepam (ATIVAN) 1 MG tablet Take 1 mg by mouth 3 (three) times daily as needed for anxiety.   Yes Historical Provider, MD  ondansetron (ZOFRAN ODT) 4 MG disintegrating tablet Take 1 tablet (4 mg total) by mouth every 8 (eight) hours as needed for nausea. 06/06/14  Yes Tanna Furry, MD  prochlorperazine (COMPAZINE) 10 MG tablet Take 10 mg by mouth  every 6 (six) hours as needed for nausea or vomiting.   Yes Historical Provider, MD  rosuvastatin (CRESTOR) 20 MG tablet Take 20 mg by mouth daily.   Yes Historical Provider, MD  triamterene-hydrochlorothiazide (MAXZIDE) 75-50 MG per tablet Take 1 tablet by mouth daily as needed (for fluid in her ear).  01/04/12  Yes Historical Provider, MD  zolpidem (AMBIEN) 10 MG tablet Take 10 mg by mouth at bedtime as needed for sleep.    Yes Historical Provider, MD  diphenoxylate-atropine (LOMOTIL) 2.5-0.025 MG per tablet Take 1 tablet by mouth 4 (four) times daily as needed for diarrhea or loose stools. Patient not taking: Reported on 12/11/2014 06/06/14   Tanna Furry, MD  NON FORMULARY Apply 1 application topically every 6 (six) hours as needed (for nausea).    Historical Provider, MD  saccharomyces boulardii (FLORASTOR) 250 MG capsule Take 1 capsule (250 mg total) by mouth 2 (two) times daily. Patient not taking: Reported on 06/06/2014 01/17/14   Janece Canterbury, MD   BP 110/75 mmHg  Pulse 74  Temp(Src) 98 F (36.7 C) (Oral)  Resp 18  SpO2 96% Physical Exam  Constitutional: She is oriented to person, place, and time. She appears well-developed and well-nourished. No distress.  Very somnolent, falls asleep after 5 seconds of conversation  HENT:  Head: Normocephalic.  Eyes: Conjunctivae are normal.  Neck: Neck supple.  Cardiovascular: Normal rate, regular rhythm and normal heart sounds.   Pulmonary/Chest: Effort normal and breath sounds normal. No respiratory distress. She has no wheezes. She has no rales.  Abdominal: Soft. Bowel sounds are normal. She exhibits no distension. There is no tenderness. There is no rebound.  No tenderness on my examination  Musculoskeletal: She exhibits no edema.  Neurological: She is alert and oriented to person, place, and time.  Skin: Skin is warm and dry.  Psychiatric: She has a normal mood and affect. Her behavior is normal.  Nursing note and vitals reviewed.   ED  Course  Procedures (including critical care time) Labs Review Labs Reviewed  CBC WITH DIFFERENTIAL/PLATELET - Abnormal; Notable for the following:    WBC 14.2 (*)    Neutrophils Relative % 80 (*)    Neutro Abs 11.3 (*)    All other components within normal limits  COMPREHENSIVE METABOLIC PANEL - Abnormal; Notable for the following:    Calcium 8.7 (*)    All other components within normal limits  LIPASE, BLOOD  URINALYSIS, ROUTINE W REFLEX MICROSCOPIC (NOT AT [facility])  I-STAT CG4 LACTIC ACID, ED  I-STAT CG4 LACTIC ACID, ED    Imaging Review No results found.   EKG Interpretation None      MDM   Final diagnoses:  Non-intractable vomiting with nausea, vomiting of unspecified type   8:05 PM Patient emergency department with nausea, vomiting, states feels just like prior gastroparesis exacerbation. Patient received 25 mg of Phenergan IV at urgent care. Patient is very somnolent. Unable to provide any history to me. Family at bedside  giving all the history. Patient falls asleep after 1-2 words. No abdominal tenderness on my examination, abdomen is benign. Patient sent here from urgent care with concern of possible diverticulitis given left lower quadrant tenderness with white count of 16. They recommended CT abdomen and pelvis. Will check labs here and order CT abdomen and pelvis. Patient's vital signs are normal.   8:45 PM Nurse came up to me asking to speak with the patient. Patient refusing CT scan, does not want to wait for her labs anymore. Patient states she feels great, she has no more symptoms and wants to be discharged home. Explained to her labs are not back yet. Patient stated she wanted to leave AMA. She again said that her symptoms feel just like prior gastroparesis exacerbation and she is feeling much better at this time. Patient was discharged home [place].  Filed Vitals:   12/11/14 1856 12/11/14 1900  BP:  110/75  Pulse:  74  Temp:  98 F (36.7 C)   TempSrc:  Oral  Resp:  18  SpO2: 98% 96%      Jeannett Senior, PA-C 12/11/14 [place], DO 12/11/14 2340

## 2014-12-11 NOTE — Progress Notes (Signed)
12/11/2014 at 9:44 PM  Olivia Barker / DOB: April 11, 1956 / MRN: [id number]  The patient has HYPERLIPIDEMIA-MIXED; GERD; GASTROPARESIS; Pyelonephritis; Overweight; Generalized anxiety disorder; Depression; Sepsis; Acute respiratory failure with hypoxia; CAP (community acquired pneumonia); History of kidney stones; History of hiatal hernia; and Cardiomegaly on her problem list.  SUBJECTIVE  Chief complaint: Emesis; Nausea; and Depression  Olivia Barker is a ill appearing female with a history of gastroparesis here today with complaints of belly pain that started 2 days previous.  Her first symptom was nausea, and this progressed to belly pain and emesis. She reports this feels like a "gastroparesis flare" and was able to evacuate her bowels yesterday and reports "it was enough to fill the toilet." She denies melena and hematochezia. She denies hematemesis.    She  has a past medical history of Interstitial cystitis (1992); Hyperlipidemia; Gastroparesis; PONV (postoperative nausea and vomiting); Anxiety; Depression; and Chronic back pain.    Medications reviewed and updated by myself where necessary, and exist elsewhere in the encounter.   Olivia Barker is allergic to aspirin; iohexol; and nsaids. She  reports that she has never smoked. She has never used smokeless tobacco. She reports that she does not drink alcohol or use illicit drugs. She  reports that she currently engages in sexual activity. The patient  has past surgical history that includes Cesarean section (06-12-1986); EUS (N/A, 06/29/2012); Fracture surgery (01/2011); Abdominal hysterectomy (1998); Cholecystectomy (apr 2014); and EUS (N/A, 10/04/2013).  Her family history includes Heart attack in her father; Heart disease in her father.  Review of Systems  Constitutional: Negative for fever.  Respiratory: Negative for cough.   Cardiovascular: Negative for chest pain.  Gastrointestinal: Positive for nausea, abdominal pain and constipation.  Negative for diarrhea, blood in stool and melena.  Genitourinary: Negative for dysuria.  Musculoskeletal: Negative for myalgias.  Skin: Negative for rash.  Neurological: Negative for dizziness and headaches.    OBJECTIVE  Her  height is 5\' 2"  (1.575 m) and weight is 161 lb (73.029 kg). Her oral temperature is 97.7 F (36.5 C). Her blood pressure is 124/80 and her pulse is 97. Her respiration is 17 and oxygen saturation is 96%.  The patient's body mass index is 29.44 kg/(m^2).  Physical Exam  Constitutional: She is oriented to person, place, and time. She appears well-developed and well-nourished. No distress.  Cardiovascular: Normal rate and regular rhythm.   Respiratory: Effort normal and breath sounds normal.  GI: Soft. She exhibits no distension and no mass. There is tenderness (diffuse and mild). There is no rebound and no guarding.  Musculoskeletal: Normal range of motion.  Neurological: She is alert and oriented to person, place, and time. No cranial nerve deficit.  Skin: Skin is warm and dry. She is not diaphoretic.   Depression screen PHQ 2/9 12/11/2014  Decreased Interest 1  Down, Depressed, Hopeless 1  PHQ - 2 Score 2  Altered sleeping 1  Tired, decreased energy 1  Change in appetite 1  Feeling bad or failure about yourself  0  Trouble concentrating 0  Moving slowly or fidgety/restless 0  Suicidal thoughts 0  PHQ-9 Score 5  Difficult doing work/chores Somewhat difficult      Results for orders placed or performed in visit on 12/11/14 (from the past 24 hour(s))  POCT urinalysis dipstick     Status: Abnormal   Collection Time: 12/11/14  5:30 PM  Result Value Ref Range   Color, UA yellow    Clarity, UA clear  Glucose, UA neg    Bilirubin, UA neg    Ketones, UA neg    Spec Grav, UA 1.020    Blood, UA neg    pH, UA 7.0    Protein, UA neg    Urobilinogen, UA 0.2    Nitrite, UA neg    Leukocytes, UA Trace (A) Negative  POCT CBC     Status: Abnormal    Collection Time: 12/11/14  5:33 PM  Result Value Ref Range   WBC 16.5 (A) 4.6 - 10.2 K/uL   Lymph, poc 2.1 0.6 - 3.4   POC LYMPH PERCENT 12.6 10 - 50 %L   MID (cbc) 0.8 0 - 0.9   POC MID % 4.6 0 - 12 %M   POC Granulocyte 13.7 (A) 2 - 6.9   Granulocyte percent 82.8 (A) 37 - 80 %G   RBC 4.96 4.04 - 5.48 M/uL   Hemoglobin 14.9 12.2 - 16.2 g/dL   HCT, POC 45.3 37.7 - 47.9 %   MCV 91.2 80 - 97 fL   MCH, POC 30.0 27 - 31.2 pg   MCHC 32.9 31.8 - 35.4 g/dL   RDW, POC 13.3 %   Platelet Count, POC 292 142 - 424 K/uL   MPV 7.4 0 - 99.8 fL    ASSESSMENT & PLAN  Olivia Barker was seen today for emesis, nausea and depression.  Diagnoses and all orders for this visit:  Nausea without vomiting Orders: -     promethazine (PHENERGAN) injection 25 mg; Inject 1 mL (25 mg total) into the muscle once. -     POCT CBC -     POCT urinalysis dipstick -     COMPLETE METABOLIC PANEL WITH GFR -     promethazine (PHENERGAN) 12.5 MG tablet; Take 1 tablet (12.5 mg total) by mouth every 8 (eight) hours as needed for nausea or vomiting.  Belly pain Orders: -     Discontinue: gi cocktail (Maalox,Lidocaine,Donnatal); Take 10 mLs by mouth once. -     Lipase  Leukocytosis: Sending patient to [facility] for further eval. There is concern for possible diverticulitis given leukocytosis.      The patient was advised to call or come back to clinic if she does not see an improvement in symptoms, or worsens with the above plan.   Philis Fendt, MHS, PA-C [facility] 12/11/2014 9:44 PM

## 2014-12-12 LAB — LIPASE: Lipase: 42 U/L (ref 7–60)

## 2015-01-18 ENCOUNTER — Other Ambulatory Visit: Payer: Self-pay | Admitting: Gastroenterology

## 2015-01-30 ENCOUNTER — Telehealth: Payer: Self-pay | Admitting: *Deleted

## 2015-01-30 DIAGNOSIS — E785 Hyperlipidemia, unspecified: Secondary | ICD-10-CM

## 2015-01-30 NOTE — Telephone Encounter (Signed)
Update: Patient called back and re-scheduled her appointment until November.

## 2015-01-30 NOTE — Telephone Encounter (Signed)
Patient called for a refill on crestor. She has not been seen in over two years, and has been without the medication for a few weeks. She decided to keep her appointment for the Friday, which she had re-scheduled.  Ok to refill or should she just wait until her appointment? Please advise. Thanks, MI

## 2015-02-01 ENCOUNTER — Ambulatory Visit: Payer: Self-pay | Admitting: Internal Medicine

## 2015-02-03 NOTE — Telephone Encounter (Signed)
OK to refill for a few months  Check  Lipids in November

## 2015-02-04 ENCOUNTER — Other Ambulatory Visit: Payer: Self-pay | Admitting: *Deleted

## 2015-02-04 MED ORDER — ROSUVASTATIN CALCIUM 20 MG PO TABS: 20.0000 mg | ORAL_TABLET | Freq: Every day | ORAL | Status: DC

## 2015-02-04 NOTE — Telephone Encounter (Signed)
crestor reordered. Ordered lipids for in November.

## 2015-03-29 ENCOUNTER — Ambulatory Visit: Payer: Self-pay | Admitting: Internal Medicine

## 2015-04-05 ENCOUNTER — Ambulatory Visit: Payer: Self-pay | Admitting: Internal Medicine

## 2015-04-11 ENCOUNTER — Ambulatory Visit: Payer: BLUE CROSS/BLUE SHIELD | Admitting: Internal Medicine

## 2015-05-21 ENCOUNTER — Other Ambulatory Visit: Payer: Self-pay | Admitting: Internal Medicine

## 2015-06-17 ENCOUNTER — Ambulatory Visit (INDEPENDENT_AMBULATORY_CARE_PROVIDER_SITE_OTHER): Payer: BLUE CROSS/BLUE SHIELD | Admitting: Internal Medicine

## 2015-06-17 ENCOUNTER — Encounter: Payer: Self-pay | Admitting: Internal Medicine

## 2015-06-17 VITALS — BP 122/90 | HR 90 | Ht 62.0 in | Wt 164.1 lb

## 2015-06-17 DIAGNOSIS — E785 Hyperlipidemia, unspecified: Secondary | ICD-10-CM | POA: Diagnosis not present

## 2015-06-17 DIAGNOSIS — I517 Cardiomegaly: Secondary | ICD-10-CM

## 2015-06-17 LAB — CBC
HEMATOCRIT: 43 % (ref 36.0–46.0)
Hemoglobin: 14.9 g/dL (ref 12.0–15.0)
MCH: 30.7 pg (ref 26.0–34.0)
MCHC: 34.7 g/dL (ref 30.0–36.0)
MCV: 88.7 fL (ref 78.0–100.0)
MPV: 9.2 fL (ref 8.6–12.4)
PLATELETS: 274 10*3/uL (ref 150–400)
RBC: 4.85 MIL/uL (ref 3.87–5.11)
RDW: 12.7 % (ref 11.5–15.5)
WBC: 6.8 10*3/uL (ref 4.0–10.5)

## 2015-06-17 LAB — BASIC METABOLIC PANEL WITH GFR
BUN: 18 mg/dL (ref 7–25)
CO2: 29 mmol/L (ref 20–31)
Calcium: 9 mg/dL (ref 8.6–10.4)
Chloride: 102 mmol/L (ref 98–110)
Creat: 0.93 mg/dL (ref 0.50–1.05)
Glucose, Bld: 97 mg/dL (ref 65–99)
Potassium: 3.8 mmol/L (ref 3.5–5.3)
Sodium: 140 mmol/L (ref 135–146)

## 2015-06-17 LAB — TSH: TSH: 0.73 m[IU]/L

## 2015-06-17 LAB — HEMOGLOBIN A1C
Hgb A1c MFr Bld: 5.9 % — ABNORMAL HIGH (ref <5.7)
Mean Plasma Glucose: 123 mg/dL — ABNORMAL HIGH (ref <117)

## 2015-06-17 MED ORDER — ROSUVASTATIN CALCIUM 20 MG PO TABS: 20.0000 mg | ORAL_TABLET | Freq: Every day | ORAL | Status: DC

## 2015-06-17 NOTE — Progress Notes (Signed)
Cardiology Office Note   Date:  06/17/2015   ID:  Olivia Barker, DOB 13-Apr-1956, MRN [id number]  PCP:  Henrine Screws, MD  Cardiologist:   Dorris Carnes, MD   F/U of HL     History of Present Illness: Olivia Barker is a 60 y.o. female with a history of HxTN, gastroparesis and HL  I saw her in 2014  Noted she had a myoview in past  Since seen she was hsop over 1 year ago with urosepsis  She also was seen in ER for GI complaints  She denies CP  Remains very actvie  Works out at gym several times per week No problems with activity  No CP  Breathing is OK        Current Outpatient Prescriptions  Medication Sig Dispense Refill  . ALPRAZolam (XANAX) 1 MG tablet Take 1 mg by mouth at bedtime as needed for anxiety or sleep.   0  . ciprofloxacin (CIPRO) 250 MG tablet Take 250 mg by mouth 2 (two) times daily.   0  . FLUoxetine (PROZAC) 40 MG capsule Take 40 mg by mouth daily.   11  . HYDROcodone-acetaminophen (NORCO/VICODIN) 5-325 MG per tablet Take 1 tablet by mouth every 4 (four) hours as needed. 10 tablet 0  . lamoTRIgine (LAMICTAL) 25 MG tablet Take 25 mg by mouth daily.   0  . ondansetron (ZOFRAN ODT) 4 MG disintegrating tablet Take 1 tablet (4 mg total) by mouth every 8 (eight) hours as needed for nausea. 6 tablet 0  . rosuvastatin (CRESTOR) 20 MG tablet TAKE 1 TABLET ONCE DAILY. 30 tablet 0  . triamterene-hydrochlorothiazide (MAXZIDE) 75-50 MG per tablet Take 1 tablet by mouth daily as needed (for fluid in her ear).     Marland Kitchen zolpidem (AMBIEN) 10 MG tablet Take 10 mg by mouth at bedtime as needed for sleep.      No current facility-administered medications for this visit.    Allergies:   Aspirin; Iohexol; and Nsaids   Past Medical History  Diagnosis Date  . Interstitial cystitis 1992  . Hyperlipidemia   . Gastroparesis   . PONV (postoperative nausea and vomiting)   . Anxiety   . Depression   . Chronic back pain     Past Surgical History  Procedure Laterality Date  .  Cesarean section  06-12-1986  . Eus N/A 06/29/2012    Procedure: ESOPHAGEAL ENDOSCOPIC ULTRASOUND (EUS) RADIAL;  Surgeon: Arta Silence, MD;  Location: [facility];  Service: Endoscopy;  Laterality: N/A;  . Fracture surgery  01/2011    R) foot  . Abdominal hysterectomy  1998    partial  . Cholecystectomy  apr 2014  . Eus N/A 10/04/2013    Procedure: ESOPHAGEAL ENDOSCOPIC ULTRASOUND (EUS) RADIAL;  Surgeon: Arta Silence, MD;  Location: [facility];  Service: Endoscopy;  Laterality: N/A;     Social History:  The patient  reports that she has never smoked. She has never used smokeless tobacco. She reports that she does not drink alcohol or use illicit drugs.   Family History:  The patient's family history includes Heart attack in her father; Heart disease in her father.    ROS:  Please see the history of present illness. All other systems are reviewed and  Negative to the above problem except as noted.    PHYSICAL EXAM: VS:  BP 122/90 mmHg  Pulse 90  Ht 5\' 2"  (1.575 m)  Wt 164 lb 1.9 oz (74.444 kg)  BMI 30.01  kg/m2  GEN: Well nourished, well developed, in no acute distress HEENT: normal Neck: no JVD, carotid bruits, or masses Cardiac: RRR; no murmurs, rubs, or gallops,no edema  Respiratory:  clear to auscultation bilaterally, normal work of breathing GI: soft, nontender, nondistended, + BS  No hepatomegaly  MS: no deformity Moving all extremities   Skin: warm and dry, no rash Neuro:  Strength and sensation are intact Psych: euthymic mood, full affect   EKG:  EKG is ordered today.  SR 90 bpm     Lipid Panel    Component Value Date/Time   CHOL 158 09/18/2013 0921   CHOL 182 09/04/2009 0758   TRIG 214* 09/18/2013 0921   TRIG 193.0* 09/04/2009 0758   HDL 40 09/18/2013 0921   HDL 44.80 09/04/2009 0758   CHOLHDL 4 09/04/2009 0758   VLDL 38.6 09/04/2009 0758   LDLCALC 75 09/18/2013 0921   LDLCALC 99 09/04/2009 0758   LDLDIRECT 214.6 07/02/2009 0956      Wt Readings  from Last 3 Encounters:  06/17/15 164 lb 1.9 oz (74.444 kg)  12/11/14 161 lb (73.029 kg)  01/16/14 168 lb 6.9 oz (76.4 kg)      ASSESSMENT AND PLAN:  1.  HL  Will get fasting panel  2  HTN  Diastolic is marginal on my check  She says it is usually better  Will chec kBMET to confirm K  3.  Glucose regulation  Check HGb A1C       Disposition:   FU with me in 1 year   Signed, Dorris Carnes, MD  06/17/2015 9:47 AM    [facility] [address], [place], [place]  [address] Phone: [phone]; Fax: [phone]

## 2015-06-17 NOTE — Patient Instructions (Signed)
Your physician recommends that you continue on your current medications as directed. Please refer to the Current Medication list given to you today. Your physician recommends that you return for lab work in: today (CBC, BMET, TSH, HgA1C, NMR)  Your physician wants you to follow-up in: [place].  You will receive a reminder letter in the mail two months in advance. If you don't receive a letter, please call our office to schedule the follow-up appointment.

## 2015-06-23 LAB — CARDIO IQ(R) ADVANCED LIPID PANEL
Apolipoprotein B: 92 mg/dL (ref 49–103)
CHOLESTEROL, TOTAL (CARDIO IQ ADV LIPID PANEL): 183 mg/dL (ref 125–200)
Cholesterol/HDL Ratio: 3.6 calc (ref <=5.0)
HDL CHOLESTEROL (CARDIO IQ ADV LIPID PANEL): 51 mg/dL (ref >=46)
LDL CHOLESTEROL CALCULATED (CARDIO IQ ADV LIPID PANEL): 85 mg/dL
LDL LARGE: 5866 nmol/L (ref 5038–17886)
LDL Medium: 252 nmol/L (ref 121–397)
LDL PARTICLE NUMBER: 1267 nmol/L (ref 1016–2185)
LDL PEAK SIZE: 210.9 Angstrom — AB (ref >=218.2)
LDL Small: 284 nmol/L (ref 115–386)
Lipoprotein (a): 75 nmol/L — ABNORMAL HIGH (ref <75)
Non-HDL Cholesterol: 132 mg/dL
Triglycerides: 237 mg/dL — ABNORMAL HIGH

## 2015-07-08 ENCOUNTER — Other Ambulatory Visit: Payer: Self-pay | Admitting: *Deleted

## 2015-07-08 ENCOUNTER — Telehealth: Payer: Self-pay | Admitting: Internal Medicine

## 2015-07-08 MED ORDER — TRIAMTERENE-HCTZ 75-50 MG PO TABS: 0.5000 | ORAL_TABLET | Freq: Every day | ORAL | Status: DC

## 2015-07-08 MED ORDER — METOPROLOL TARTRATE 25 MG PO TABS: 25.0000 mg | ORAL_TABLET | Freq: Two times a day (BID) | ORAL | Status: DC

## 2015-07-08 NOTE — Telephone Encounter (Signed)
Reordered maxzide 75/50--1/2 tablet daily; ordered metoprolol 25 mg BID to [facility] per phone note by Dr. Harrington Challenger.

## 2015-07-08 NOTE — Telephone Encounter (Signed)
Spoke to pt on phone  BP has been running higher   Diastolic was in 0000000.  Taking 1/2 of Maxzide 75/50 per day I would recomm adding metoprolol 25 bid    Pt needs fills/refills on both meds to [facility]  I hae asked her to use pt portal to get back with BP results in a few wks.

## 2015-07-08 NOTE — Telephone Encounter (Signed)
Pt's blood pressure have been up for weeks. Dr Milford Cage talked to Dr Randell Patient was told to call Dr Harrington Challenger today.

## 2015-08-21 DIAGNOSIS — F411 Generalized anxiety disorder: Secondary | ICD-10-CM | POA: Diagnosis not present

## 2015-08-21 DIAGNOSIS — F319 Bipolar disorder, unspecified: Secondary | ICD-10-CM | POA: Diagnosis not present

## 2015-08-26 ENCOUNTER — Telehealth: Payer: Self-pay | Admitting: Internal Medicine

## 2015-08-26 NOTE — Telephone Encounter (Signed)
The pt is advised that Dr Harrington Challenger states that she will be calling her today concerning her weight gain. She verbalized understanding. Please call her at [phone].  Will forward note to Dr Harrington Challenger.

## 2015-08-26 NOTE — Telephone Encounter (Signed)
Mrs. Sonnentag is calling because she was placed on Metoprolol for about 4-6wks now and she is gaining weight. States she has gained almost a pound per day since she has been taking this medication . Please call   Thanks

## 2015-08-26 NOTE — Telephone Encounter (Signed)
**Note De-identified Olivia Barker Obfuscation** LMTCB

## 2015-08-26 NOTE — Telephone Encounter (Signed)
The pt states that since starting Metformin 2 months ago and Metoprolol 6 weeks ago that she has gained 10 lbs.  She states that her BP was elevated about 6 weeks ago and that she spoke with Dr Harrington Challenger over the phone who recommended that she start taking Metoprolol 25 mg BID. She questions if her BP was elevated due to the stress she was/is under as she has never had HTN. She states that her medicated BP lately has been around 120/75.  She reports that her Endocrinologist has started her on Metformin about 2 months ago because her body is producing to much insulin.  She states that the 10 lbs she has gained is in her abdomen and that her abdomen feels really hard to the touch and she believes that she is having some fluid retention as well.

## 2015-08-27 NOTE — Telephone Encounter (Signed)
Follow up   Pt is calling to speak to RN  She verbalized that she was told by a RN that Dr.Ross will be calling her on 08/26/15 and she never received a call  Pt is calling to speak to RN or Dr.Ross

## 2015-08-27 NOTE — Telephone Encounter (Signed)
Returned call to patient no answer.Left message on personal voice mail will send message to Dr.Ross.

## 2015-08-27 NOTE — Telephone Encounter (Signed)
Called pt She has been feeling bad (bloated, energy down) since starte metoprolol and then metformin I would recomm cutting back on metorpolol to 1 time per day for a few days then stop Keep on maxzide Email back response in a few wks

## 2015-08-28 NOTE — Telephone Encounter (Signed)
Metoprolol removed from her medicine list at this time.

## 2015-09-04 DIAGNOSIS — F411 Generalized anxiety disorder: Secondary | ICD-10-CM | POA: Diagnosis not present

## 2015-09-04 DIAGNOSIS — F319 Bipolar disorder, unspecified: Secondary | ICD-10-CM | POA: Diagnosis not present

## 2015-09-16 ENCOUNTER — Ambulatory Visit (INDEPENDENT_AMBULATORY_CARE_PROVIDER_SITE_OTHER): Payer: BLUE CROSS/BLUE SHIELD

## 2015-09-16 ENCOUNTER — Ambulatory Visit (INDEPENDENT_AMBULATORY_CARE_PROVIDER_SITE_OTHER): Payer: BLUE CROSS/BLUE SHIELD | Admitting: Podiatry

## 2015-09-16 VITALS — BP 96/75 | HR 100 | Resp 16 | Ht 62.0 in

## 2015-09-16 DIAGNOSIS — M779 Enthesopathy, unspecified: Secondary | ICD-10-CM | POA: Diagnosis not present

## 2015-09-16 DIAGNOSIS — M79671 Pain in right foot: Secondary | ICD-10-CM | POA: Diagnosis not present

## 2015-09-16 MED ORDER — TRIAMCINOLONE ACETONIDE 10 MG/ML IJ SUSP: 10.0000 mg | Freq: Once | INTRAMUSCULAR | Status: DC

## 2015-09-16 NOTE — Progress Notes (Signed)
   Subjective:    Patient ID: Olivia Barker, female    DOB: [birth year], 60 y.o.   MRN: [id number]  HPI Chief Complaint  Patient presents with  . Foot Pain    Right foot; dorsal; swollen; pt stated, "Pain just starts to flare-up; broke foot 4 years ago"      Review of Systems  All other systems reviewed and are negative.      Objective:   Physical Exam        Assessment & Plan:

## 2015-09-17 DIAGNOSIS — M7061 Trochanteric bursitis, right hip: Secondary | ICD-10-CM | POA: Diagnosis not present

## 2015-09-17 NOTE — Progress Notes (Signed)
Subjective:     Patient ID: Olivia Barker, female   DOB: March 07, 1956, 60 y.o.   MRN: [id number]  HPI patient states that she has a lot of pain on top of her right foot that makes wearing shoe gear difficult and that she was out of town and doing a lot of walking   Review of Systems     Objective:   Physical Exam Neurovascular status intact muscle strength adequate with inflammation in the dorsum of the right foot within the extensor tendon complex    Assessment:     Tendinitis dorsum right foot    Plan:     X-ray reviewed and injected the tendon complex 3 mg Kenalog 5 mg Xylocaine advised on heat and ice therapy and reappoint to recheck  X-ray report indicated screw in the fifth metatarsal right that's healed well with no indications of pathology

## 2015-09-23 DIAGNOSIS — M545 Low back pain: Secondary | ICD-10-CM | POA: Diagnosis not present

## 2015-09-23 DIAGNOSIS — M25551 Pain in right hip: Secondary | ICD-10-CM | POA: Diagnosis not present

## 2015-09-27 DIAGNOSIS — F411 Generalized anxiety disorder: Secondary | ICD-10-CM | POA: Diagnosis not present

## 2015-10-02 DIAGNOSIS — M545 Low back pain: Secondary | ICD-10-CM | POA: Diagnosis not present

## 2015-10-02 DIAGNOSIS — M5116 Intervertebral disc disorders with radiculopathy, lumbar region: Secondary | ICD-10-CM | POA: Diagnosis not present

## 2015-10-15 DIAGNOSIS — N3000 Acute cystitis without hematuria: Secondary | ICD-10-CM | POA: Diagnosis not present

## 2015-10-15 DIAGNOSIS — R300 Dysuria: Secondary | ICD-10-CM | POA: Diagnosis not present

## 2015-10-23 DIAGNOSIS — N3000 Acute cystitis without hematuria: Secondary | ICD-10-CM | POA: Diagnosis not present

## 2015-10-23 DIAGNOSIS — N200 Calculus of kidney: Secondary | ICD-10-CM | POA: Diagnosis not present

## 2015-10-25 DIAGNOSIS — F411 Generalized anxiety disorder: Secondary | ICD-10-CM | POA: Diagnosis not present

## 2015-11-01 DIAGNOSIS — Z6830 Body mass index (BMI) 30.0-30.9, adult: Secondary | ICD-10-CM | POA: Diagnosis not present

## 2015-11-01 DIAGNOSIS — Z0001 Encounter for general adult medical examination with abnormal findings: Secondary | ICD-10-CM | POA: Diagnosis not present

## 2015-11-01 DIAGNOSIS — R739 Hyperglycemia, unspecified: Secondary | ICD-10-CM | POA: Diagnosis not present

## 2015-11-01 DIAGNOSIS — K219 Gastro-esophageal reflux disease without esophagitis: Secondary | ICD-10-CM | POA: Diagnosis not present

## 2015-11-01 DIAGNOSIS — E669 Obesity, unspecified: Secondary | ICD-10-CM | POA: Diagnosis not present

## 2015-11-01 DIAGNOSIS — G43909 Migraine, unspecified, not intractable, without status migrainosus: Secondary | ICD-10-CM | POA: Diagnosis not present

## 2015-11-14 DIAGNOSIS — R7301 Impaired fasting glucose: Secondary | ICD-10-CM | POA: Diagnosis not present

## 2015-11-18 DIAGNOSIS — N201 Calculus of ureter: Secondary | ICD-10-CM | POA: Diagnosis not present

## 2015-11-18 DIAGNOSIS — N132 Hydronephrosis with renal and ureteral calculous obstruction: Secondary | ICD-10-CM | POA: Diagnosis not present

## 2015-11-19 ENCOUNTER — Other Ambulatory Visit: Payer: Self-pay | Admitting: Urology

## 2015-11-19 DIAGNOSIS — N202 Calculus of kidney with calculus of ureter: Secondary | ICD-10-CM | POA: Diagnosis not present

## 2015-11-19 DIAGNOSIS — N201 Calculus of ureter: Secondary | ICD-10-CM | POA: Diagnosis not present

## 2015-11-19 DIAGNOSIS — R7301 Impaired fasting glucose: Secondary | ICD-10-CM | POA: Diagnosis not present

## 2015-11-20 ENCOUNTER — Encounter (HOSPITAL_COMMUNITY): Payer: Self-pay

## 2015-11-20 ENCOUNTER — Other Ambulatory Visit: Payer: Self-pay | Admitting: Urology

## 2015-11-20 NOTE — H&P (Signed)
Office Visit Report     11/19/2015   --------------------------------------------------------------------------------   Olivia Barker  MRN: [id number]  PRIMARY CARE:  Ria Bush, MD  DOB: 22-Feb-1956, 60 year old Female  REFERRING:    SSN: -[id number]  PROVIDER:  Jimmey Ralph    TREATING:  Irine Seal, M.D.    LOCATION:  [facility] [phone]   --------------------------------------------------------------------------------   CC: I have ureteral stone.  HPI: Olivia Barker is a 60 year-old female established patient who is here for ureteral stone.  The problem is on the right side. She first stated noticing pain on approximately 10/10/2015. This is not her first kidney stone. She is currently having back pain and nausea. She denies having flank pain, groin pain, vomiting, fever, and chills. Pain is occuring on the right side.   A CT done on 7/10 shows a 45m right distal stone with minimal obstruction. She has no gross hematuria but she has marked frequency and nocturia. the pain is moderately severe.      ALLERGIES: ivp NSAIDS (Non-Steroidal Anti-Inflammatory Drug)    MEDICATIONS: Aciphex 20 MG Oral Tablet Delayed Release Oral  ALPRAZolam 0.5 MG Oral Tablet Oral  Ambien 5 mg tablet  Crestor 10 MG Oral Tablet Oral  Hydrocodone-Acetaminophen 5 mg-325 mg tablet 1 tablet PO Q 6 H  Lamictal  Librax CAPS Oral  PROzac 20 MG Oral Capsule Oral     GU PSH: Cystoscopy - 2March 08, 2008Hysterectomy Unilat SO -2006-07-17     PSH Notes: Cholecystectomy, Foot Surgery, Abdominoplasty, Cystoscopy (Diagnostic), Cesarean Section, Breast Surgery, Hysterectomy   NON-GU PSH: Breast Surgery Procedure - 2March 08, 2008Cesarean Delivery Only -08-Mar-2008Cholecystectomy - 2March 09, 2014EXC SKIN ABD ADD-ON - 203-12-2006   GU PMH: Acute Cystitis (Worsening), She had >100000 G+ Cocci and 10K dipthroids on her recent culture but hasn't improved with macrobid. - 10/23/2015, Acute Cystitis - 10/15/2015, Acute Cystitis, Acute  cystitis without hematuria - 203/09/2014Dysuria - 10/15/2015, Dysuria, Dysuria - 03/23/2014 Interstitial Cystitis, chronic w/o hematuria, Chronic interstitial cystitis without hematuria - 03/23/2014 Kidney Stone, Nephrolithiasis - 03/23/2014 Personal Hx Urinary Tract Infections, History of urinary tract infection - 03/23/2014, History of pyelonephritis, - 01/24/2014 Candidiasis of vulva and vagina, Vaginal candidiasis - 01/24/2014 Oth GU systems Signs/Symptoms, Bladder pain - 203/09/14Urgency of urination, Urinary urgency - 2March 09, 2014   NON-GU PMH: Gram-negative sepsis, unspecified, Sepsis, Gram negative - 03/23/2014 Encounter for general adult medical examination without abnormal findings, Encounter for preventive health examination - 22014/03/09Personal history of other diseases of the digestive system, History of chronic cholecystitis - 203/01/2013 History of esophageal reflux, - 2Mar 09, 2014Anxiety disorder, unspecified, Anxiety - 203-09-14Personal history of other endocrine, nutritional and metabolic disease, History of hypercholesterolemia - 22014/03/09   FAMILY HISTORY: Death In The Family Father - Runs In Family Deceased - Father Diabetes - Father, Father, Brother, [place] - [place] Family Heart Disease - Father, Father nephrolithiasis - Mother, Mother   SOCIAL HISTORY: Marital Status: Married Current Smoking Status: Patient has never smoked.  Has never drank.  Drinks 1 caffeinated drink per day.     Notes: Never A Smoker, Alcohol Use, Occupation:, Caffeine Use, Marital History - Currently Married   REVIEW OF SYSTEMS:    GU Review Female:   Patient reports frequent urination and hard to postpone urination. Patient denies burning /pain with urination, get up at night to urinate, leakage of urine, stream starts and stops, trouble starting your  stream, have to strain to urinate, and currently pregnant.  Gastrointestinal (Upper):   Patient reports nausea. Patient denies vomiting and indigestion/  heartburn.  Gastrointestinal (Lower):   Patient denies diarrhea and constipation.  Constitutional:   Patient denies fever, night sweats, weight loss, and fatigue.  Skin:   Patient denies skin rash/ lesion and itching.  Eyes:   Patient denies blurred vision and double vision.  Ears/ Nose/ Throat:   Patient denies sore throat and sinus problems.  Hematologic/Lymphatic:   Patient denies swollen glands and easy bruising.  Cardiovascular:   Patient denies leg swelling and chest pains.  Respiratory:   Patient denies cough and shortness of breath.  Endocrine:   Patient denies excessive thirst.  Musculoskeletal:   Patient reports back pain. Patient denies joint pain.  Neurological:   Patient denies headaches and dizziness.  Psychologic:   Patient denies depression and anxiety.   VITAL SIGNS: None   PAST DATA REVIEWED:  Source Of History:  Patient  Urine Test Review:   Urinalysis  X-Ray Review: C.T. Stone Protocol: Reviewed Films. Reviewed Report. 32m right distal stone and bilateral renal stones.     PROCEDURES:          Urinalysis w/Scope - 81001 Dipstick Dipstick Cont'd Micro  Specimen: Voided Bilirubin: Neg WBC/hpf: 0-5/hpf  Color: Green Ketones: Neg RBC/hpf: 20-40/hpf  Appearance: Clear Blood: 3+ Bacteria: Few (10-25/hpf)  Specific Gravity: 1.015 Protein: Neg Cystals: Amorph Urates  pH: 6.0 Urobilinogen: 0.2 Casts: NS (Not Seen)  Glucose: Neg Nitrites: Neg Trichomonas: Not Present    Leukocyte Esterase: Trace Mucous: Present      Epithelial Cells: 0-5/hpf      Yeast: NS (Not Seen)      Sperm: Not Present    ASSESSMENT:      ICD-10 Details  1 GU:   Calculus Ureter - N20.1 Right  2   Kidney Stone - N20.0 Stable     PLAN:            Medications New Meds: Promethazine Hcl 25 mg tablet 1 tablet PO Q 6 H PRN   #20  1 Refill(s)            Orders Labs Urine Culture and Sensitivity          Schedule Return Visit: ASAP - Schedule Surgery  Return Notes: She has a 655mright  distal stone. I discussed MET, ureteroscopy and ESWL and she is interested in ESWL. I have reivewed the risks of bleeding, infection, injury to the ureter or adjacent structures, incomplete fragmentation with need for secondary procedures, thrombotic events or sedation complications.           Document Letter(s):  Created for Patient: Clinical Summary    * Signed by JoIrine SealM.D. on 11/19/15 at 5:14 PM (EDT)*

## 2015-11-21 ENCOUNTER — Encounter (HOSPITAL_COMMUNITY): Payer: Self-pay | Admitting: Emergency Medicine

## 2015-11-21 ENCOUNTER — Encounter (HOSPITAL_COMMUNITY): Payer: Self-pay | Admitting: General Practice

## 2015-11-21 ENCOUNTER — Encounter (HOSPITAL_COMMUNITY)
Admission: RE | Disposition: A | Discharge status: Home / Self Care | Payer: Self-pay | Source: Ambulatory Visit | Attending: Urology

## 2015-11-21 ENCOUNTER — Emergency Department (HOSPITAL_COMMUNITY)
Admission: EM | Admit: 2015-11-21 | Discharge: 2015-11-21 | Disposition: A | Discharge status: Home / Self Care | Payer: BLUE CROSS/BLUE SHIELD | Source: Home / Self Care | Attending: Emergency Medicine | Admitting: Emergency Medicine

## 2015-11-21 ENCOUNTER — Ambulatory Visit (HOSPITAL_COMMUNITY)
Admission: RE | Admit: 2015-11-21 | Discharge: 2015-11-21 | Disposition: A | Discharge status: Home / Self Care | Payer: BLUE CROSS/BLUE SHIELD | Source: Ambulatory Visit | Attending: Urology | Admitting: Urology

## 2015-11-21 ENCOUNTER — Ambulatory Visit (HOSPITAL_COMMUNITY): Payer: BLUE CROSS/BLUE SHIELD

## 2015-11-21 DIAGNOSIS — Z79899 Other long term (current) drug therapy: Secondary | ICD-10-CM | POA: Insufficient documentation

## 2015-11-21 DIAGNOSIS — N200 Calculus of kidney: Secondary | ICD-10-CM

## 2015-11-21 DIAGNOSIS — E785 Hyperlipidemia, unspecified: Secondary | ICD-10-CM | POA: Insufficient documentation

## 2015-11-21 DIAGNOSIS — N201 Calculus of ureter: Secondary | ICD-10-CM | POA: Diagnosis not present

## 2015-11-21 DIAGNOSIS — E7800 Pure hypercholesterolemia, unspecified: Secondary | ICD-10-CM | POA: Insufficient documentation

## 2015-11-21 DIAGNOSIS — Z792 Long term (current) use of antibiotics: Secondary | ICD-10-CM

## 2015-11-21 DIAGNOSIS — F329 Major depressive disorder, single episode, unspecified: Secondary | ICD-10-CM

## 2015-11-21 DIAGNOSIS — N202 Calculus of kidney with calculus of ureter: Secondary | ICD-10-CM | POA: Insufficient documentation

## 2015-11-21 DIAGNOSIS — Z87440 Personal history of urinary (tract) infections: Secondary | ICD-10-CM | POA: Insufficient documentation

## 2015-11-21 DIAGNOSIS — R110 Nausea: Secondary | ICD-10-CM | POA: Diagnosis not present

## 2015-11-21 DIAGNOSIS — F419 Anxiety disorder, unspecified: Secondary | ICD-10-CM | POA: Insufficient documentation

## 2015-11-21 DIAGNOSIS — K2970 Gastritis, unspecified, without bleeding: Secondary | ICD-10-CM | POA: Diagnosis not present

## 2015-11-21 HISTORY — DX: Tinnitus, unspecified ear: H93.19

## 2015-11-21 HISTORY — PX: EXTRACORPOREAL SHOCK WAVE LITHOTRIPSY: SHX1557

## 2015-11-21 LAB — URINALYSIS, ROUTINE W REFLEX MICROSCOPIC
Bilirubin Urine: NEGATIVE
GLUCOSE, UA: NEGATIVE mg/dL
Ketones, ur: NEGATIVE mg/dL
Nitrite: NEGATIVE
PH: 5 (ref 5.0–8.0)
PROTEIN: 100 mg/dL — AB
SPECIFIC GRAVITY, URINE: 1.024 (ref 1.005–1.030)

## 2015-11-21 LAB — URINE MICROSCOPIC-ADD ON

## 2015-11-21 SURGERY — LITHOTRIPSY, ESWL
Anesthesia: LOCAL | Laterality: Right

## 2015-11-21 MED ORDER — MORPHINE SULFATE (PF) 4 MG/ML IV SOLN
6.0000 mg | Freq: Once | INTRAVENOUS | Status: AC
  Administered 2015-11-21: 6 mg via INTRAVENOUS
  Filled 2015-11-21: qty 2

## 2015-11-21 MED ORDER — DIPHENHYDRAMINE HCL 25 MG PO CAPS
25.0000 mg | ORAL_CAPSULE | ORAL | Status: AC
  Administered 2015-11-21: 25 mg via ORAL
  Filled 2015-11-21: qty 1

## 2015-11-21 MED ORDER — OXYCODONE-ACETAMINOPHEN 5-325 MG PO TABS: 1.0000 | ORAL_TABLET | ORAL | Status: DC | PRN

## 2015-11-21 MED ORDER — DIAZEPAM 5 MG PO TABS
10.0000 mg | ORAL_TABLET | ORAL | Status: AC
  Administered 2015-11-21: 10 mg via ORAL
  Filled 2015-11-21: qty 2

## 2015-11-21 MED ORDER — SODIUM CHLORIDE 0.9 % IV SOLN
INTRAVENOUS | Status: DC
  Administered 2015-11-21: 09:00:00 via INTRAVENOUS

## 2015-11-21 MED ORDER — CIPROFLOXACIN HCL 500 MG PO TABS
500.0000 mg | ORAL_TABLET | ORAL | Status: AC
  Administered 2015-11-21: 500 mg via ORAL
  Filled 2015-11-21: qty 1

## 2015-11-21 MED ORDER — ONDANSETRON HCL 4 MG/2ML IJ SOLN
4.0000 mg | Freq: Once | INTRAMUSCULAR | Status: AC
  Administered 2015-11-21: 4 mg via INTRAVENOUS
  Filled 2015-11-21: qty 2

## 2015-11-21 MED ORDER — HYDROMORPHONE HCL 1 MG/ML IJ SOLN
1.0000 mg | Freq: Once | INTRAMUSCULAR | Status: DC
  Filled 2015-11-21: qty 1

## 2015-11-21 NOTE — ED Provider Notes (Signed)
CSN: [id number]     Arrival date & time 11/21/15  1503 History   First MD Initiated Contact with Patient 11/21/15 1518     Chief Complaint  Patient presents with  . Flank Pain     (Consider location/radiation/quality/duration/timing/severity/associated sxs/prior Treatment) Patient is a 60 y.o. female presenting with flank pain. The history is provided by the patient (The patient states she had lithotripsy today and is having severe right flank pain).  Flank Pain This is a recurrent problem. The current episode started 1 to 2 hours ago. The problem occurs constantly. The problem has not changed since onset.Pertinent negatives include no chest pain, no abdominal pain and no headaches. Nothing aggravates the symptoms. Nothing relieves the symptoms.    Past Medical History  Diagnosis Date  . Interstitial cystitis 1992  . Hyperlipidemia   . Gastroparesis   . PONV (postoperative nausea and vomiting)   . Anxiety   . Depression   . Chronic back pain   . Kidney stones   . Tinnitus     Fluid in ears   Past Surgical History  Procedure Laterality Date  . Cesarean section  06-12-1986  . Eus N/A 06/29/2012    Procedure: ESOPHAGEAL ENDOSCOPIC ULTRASOUND (EUS) RADIAL;  Surgeon: Arta Silence, MD;  Location: [facility];  Service: Endoscopy;  Laterality: N/A;  . Fracture surgery  01/2011    R) foot  . Abdominal hysterectomy  1998    partial  . Cholecystectomy  apr 2014  . Eus N/A 10/04/2013    Procedure: ESOPHAGEAL ENDOSCOPIC ULTRASOUND (EUS) RADIAL;  Surgeon: Arta Silence, MD;  Location: [facility];  Service: Endoscopy;  Laterality: N/A;   Family History  Problem Relation Age of Onset  . Heart disease Father   . Heart attack Father    Social History  Substance Use Topics  . Smoking status: Never Smoker   . Smokeless tobacco: Never Used  . Alcohol Use: No   OB History    Gravida Para Term Preterm AB TAB SAB Ectopic Multiple Living   2 2 2       2      Review of Systems   Constitutional: Negative for appetite change and fatigue.  HENT: Negative for congestion, ear discharge and sinus pressure.   Eyes: Negative for discharge.  Respiratory: Negative for cough.   Cardiovascular: Negative for chest pain.  Gastrointestinal: Negative for abdominal pain and diarrhea.  Genitourinary: Positive for flank pain. Negative for frequency and hematuria.  Musculoskeletal: Negative for back pain.  Skin: Negative for rash.  Neurological: Negative for seizures and headaches.  Psychiatric/Behavioral: Negative for hallucinations.      Allergies  Aspirin; Iohexol; and Nsaids  Home Medications   Prior to Admission medications   Medication Sig Start Date End Date Taking? Authorizing Provider  ALPRAZolam Duanne Moron) 1 MG tablet Take 1 mg by mouth at bedtime as needed for anxiety or sleep.  06/13/15  Yes Historical Provider, MD  FLUoxetine (PROZAC) 40 MG capsule Take 40 mg by mouth daily.  05/22/15  Yes Historical Provider, MD  lamoTRIgine (LAMICTAL) 25 MG tablet Take 25 mg by mouth at bedtime.  06/13/15  Yes Historical Provider, MD  nitrofurantoin, macrocrystal-monohydrate, (MACROBID) 100 MG capsule Take 100 mg by mouth daily.   Yes Historical Provider, MD  rosuvastatin (CRESTOR) 20 MG tablet Take 1 tablet (20 mg total) by mouth daily. 06/17/15  Yes Fay Records, MD  triamterene-hydrochlorothiazide (MAXZIDE) 75-50 MG tablet Take 0.5 tablets by mouth daily. 07/08/15  Yes Carmin Muskrat  Harrington Challenger, MD  zolpidem (AMBIEN) 10 MG tablet Take 10 mg by mouth at bedtime as needed for sleep.    Yes Historical Provider, MD  HYDROcodone-acetaminophen (NORCO/VICODIN) 5-325 MG tablet Take 1 tablet by mouth every 4 (four) hours as needed for moderate pain.    Historical Provider, MD  ondansetron (ZOFRAN ODT) 4 MG disintegrating tablet Take 1 tablet (4 mg total) by mouth every 8 (eight) hours as needed for nausea. Patient not taking: Reported on 11/21/2015 06/06/14   Tanna Furry, MD  promethazine (PHENERGAN) 25 MG  tablet Take 25 mg by mouth every 6 (six) hours as needed for nausea or vomiting.  11/19/15   Historical Provider, MD  tamsulosin (FLOMAX) 0.4 MG CAPS capsule Take 0.4 mg by mouth daily.  11/21/15   Historical Provider, MD   BP 132/82 mmHg  Pulse 82  Temp(Src) 98.5 F (36.9 C) (Oral)  Resp 16  SpO2 99% Physical Exam  Constitutional: She is oriented to person, place, and time. She appears well-developed.  HENT:  Head: Normocephalic.  Eyes: Conjunctivae and EOM are normal. No scleral icterus.  Neck: Neck supple. No thyromegaly present.  Cardiovascular: Normal rate and regular rhythm.  Exam reveals no gallop and no friction rub.   No murmur heard. Pulmonary/Chest: No stridor. She has no wheezes. She has no rales. She exhibits no tenderness.  Abdominal: She exhibits no distension. There is no tenderness. There is no rebound.  Genitourinary:  Tender right flank  Musculoskeletal: Normal range of motion. She exhibits no edema.  Lymphadenopathy:    She has no cervical adenopathy.  Neurological: She is oriented to person, place, and time. She exhibits normal muscle tone. Coordination normal.  Skin: No rash noted. No erythema.  Psychiatric: She has a normal mood and affect. Her behavior is normal.    ED Course  Procedures (including critical care time) Labs Review Labs Reviewed  URINALYSIS, ROUTINE W REFLEX MICROSCOPIC (NOT AT [facility]) - Abnormal; Notable for the following:    Color, Urine AMBER (*)    APPearance TURBID (*)    Hgb urine dipstick LARGE (*)    Protein, ur 100 (*)    Leukocytes, UA SMALL (*)    All other components within normal limits  URINE MICROSCOPIC-ADD ON - Abnormal; Notable for the following:    Squamous Epithelial / LPF 0-5 (*)    Bacteria, UA MANY (*)    All other components within normal limits  URINE CULTURE    Imaging Review Dg Abd 1 View  11/21/2015  CLINICAL DATA:  Pre lithotripsy for right kidney stone. EXAM: ABDOMEN - 1 VIEW COMPARISON:  CT scan from  11/18/2015. FINDINGS: Frontal view shows the distal right ureteral stone as confirmed on the scout image for prior CT scan. Multiple bilateral renal stones are evident. Normal bowel gas pattern. IMPRESSION: Persistent, stable position of the distal right ureteral stone. Electronically Signed   By: Misty Stanley M.D.   On: 11/21/2015 08:21   I have personally reviewed and evaluated these images and lab results as part of my medical decision-making.   EKG Interpretation None      MDM   Final diagnoses:  Kidney stone    Patient status post lithotripsy. Patient improved with 4 morphine. Her urinalysis showed some bacteria. She will get a urine culture. Will follow-up with a urologist I have prescribed some Percocets just in case she did not get a prescription for pain medicine today from the urologist. She was unsure whether she had a prescription  or not    Milton Ferguson, MD 11/21/15 [phone]

## 2015-11-21 NOTE — ED Notes (Signed)
Patient here with complaints of right flank pain and difficulty urinating. Lithotripsy today. Pain 10/10. Fent 165mcg.

## 2015-11-21 NOTE — ED Notes (Signed)
Bed: [id number] Expected date:  Expected time:  Means of arrival:  Comments: EMS- 60yo F, post-lithotripsy pain

## 2015-11-21 NOTE — Op Note (Signed)
Please see scanned chart for ESWL operative note. 

## 2015-11-21 NOTE — Discharge Instructions (Signed)
Gets the medicine the urologist prescribed today and call them tomorrow for follow-up appointment. Let them know you were in the emergency room today

## 2015-11-21 NOTE — Interval H&P Note (Signed)
History and Physical Interval Note:  11/21/2015 10:10 AM  Olivia Barker  has presented today for surgery, with the diagnosis of RIGHT DISTAL STONE  The various methods of treatment have been discussed with the patient and family. After consideration of risks, benefits and other options for treatment, the patient has consented to  Procedure(s): RIGHT EXTRACORPOREAL SHOCK WAVE LITHOTRIPSY (ESWL) (Right) as a surgical intervention .  The patient's history has been reviewed, patient examined, no change in status, stable for surgery.  I have reviewed the patient's chart and labs.  Questions were answered to the patient's satisfaction.     Jamis Kryder,LES

## 2015-11-21 NOTE — Discharge Instructions (Signed)
1. You should strain your urine and collect all fragments and bring them to your follow up appointment.  °2. You should take your pain medication as needed.  Please call if your pain is severe to the point that it is not controlled with your pain medication. °3. You should call if you develop fever > 101 or persistent nausea or vomiting. °4. Your doctor may prescribe tamsulosin to take to help facilitate stone passage. °

## 2015-11-22 LAB — URINE CULTURE
Culture: NO GROWTH
Special Requests: NORMAL

## 2015-11-26 DIAGNOSIS — N201 Calculus of ureter: Secondary | ICD-10-CM | POA: Diagnosis not present

## 2015-12-06 DIAGNOSIS — F411 Generalized anxiety disorder: Secondary | ICD-10-CM | POA: Diagnosis not present

## 2015-12-20 DIAGNOSIS — R350 Frequency of micturition: Secondary | ICD-10-CM | POA: Diagnosis not present

## 2015-12-20 DIAGNOSIS — R8271 Bacteriuria: Secondary | ICD-10-CM | POA: Diagnosis not present

## 2015-12-20 DIAGNOSIS — N200 Calculus of kidney: Secondary | ICD-10-CM | POA: Diagnosis not present

## 2015-12-20 DIAGNOSIS — N3010 Interstitial cystitis (chronic) without hematuria: Secondary | ICD-10-CM | POA: Diagnosis not present

## 2015-12-23 DIAGNOSIS — F419 Anxiety disorder, unspecified: Secondary | ICD-10-CM | POA: Diagnosis not present

## 2015-12-23 DIAGNOSIS — R000 Tachycardia, unspecified: Secondary | ICD-10-CM | POA: Diagnosis not present

## 2015-12-24 DIAGNOSIS — R000 Tachycardia, unspecified: Secondary | ICD-10-CM | POA: Diagnosis not present

## 2015-12-27 DIAGNOSIS — R3982 Chronic bladder pain: Secondary | ICD-10-CM | POA: Diagnosis not present

## 2015-12-27 DIAGNOSIS — R8271 Bacteriuria: Secondary | ICD-10-CM | POA: Diagnosis not present

## 2016-01-08 DIAGNOSIS — F411 Generalized anxiety disorder: Secondary | ICD-10-CM | POA: Diagnosis not present

## 2016-01-14 DIAGNOSIS — N3000 Acute cystitis without hematuria: Secondary | ICD-10-CM | POA: Diagnosis not present

## 2016-01-23 DIAGNOSIS — Z1231 Encounter for screening mammogram for malignant neoplasm of breast: Secondary | ICD-10-CM | POA: Diagnosis not present

## 2016-02-04 DIAGNOSIS — R8290 Unspecified abnormal findings in urine: Secondary | ICD-10-CM | POA: Diagnosis not present

## 2016-02-04 DIAGNOSIS — N3010 Interstitial cystitis (chronic) without hematuria: Secondary | ICD-10-CM | POA: Diagnosis not present

## 2016-02-11 DIAGNOSIS — N3010 Interstitial cystitis (chronic) without hematuria: Secondary | ICD-10-CM | POA: Diagnosis not present

## 2016-02-14 DIAGNOSIS — N3010 Interstitial cystitis (chronic) without hematuria: Secondary | ICD-10-CM | POA: Diagnosis not present

## 2016-02-18 DIAGNOSIS — N3010 Interstitial cystitis (chronic) without hematuria: Secondary | ICD-10-CM | POA: Diagnosis not present

## 2016-02-20 DIAGNOSIS — R3982 Chronic bladder pain: Secondary | ICD-10-CM | POA: Diagnosis not present

## 2016-02-20 DIAGNOSIS — N3010 Interstitial cystitis (chronic) without hematuria: Secondary | ICD-10-CM | POA: Diagnosis not present

## 2016-02-20 DIAGNOSIS — R350 Frequency of micturition: Secondary | ICD-10-CM | POA: Diagnosis not present

## 2016-02-20 DIAGNOSIS — N952 Postmenopausal atrophic vaginitis: Secondary | ICD-10-CM | POA: Diagnosis not present

## 2016-02-21 ENCOUNTER — Other Ambulatory Visit: Payer: Self-pay | Admitting: Urology

## 2016-02-21 ENCOUNTER — Encounter (HOSPITAL_BASED_OUTPATIENT_CLINIC_OR_DEPARTMENT_OTHER): Payer: Self-pay | Admitting: *Deleted

## 2016-02-26 ENCOUNTER — Encounter (HOSPITAL_BASED_OUTPATIENT_CLINIC_OR_DEPARTMENT_OTHER): Payer: Self-pay | Admitting: *Deleted

## 2016-02-26 NOTE — Progress Notes (Signed)
NPO AFTER MN.  ARRIVE AT 0700.  NEEDS ISTAT 8.  WILL TAKE ZANTAC AND PROZAC AM DOS W/ SIPS OF WATER.

## 2016-03-02 NOTE — H&P (Signed)
CC/HPI: I have interstitial cystitis.     Olivia Barker returns today in f/u for her history of IC and worsening symptoms. She had anesthetic cocktail installition about 2 weeks ago and had increased pain after that. She was given Vicodin with some relief. She has frequency q1hr with intermittency. She has nocturia x 1. She has pressure with a full bladder with minimal relief with voiding. She has no hematuria or dysuria. She has pain across the low back that is not positional. The pain is 7/10. The pain is constant. She has vaginal pain as well. She has a history of stones but the pain doesn't lateralize and isn't like stone pain. She has pain with intercourse. She has had an HOD remotely and had relief.     ALLERGIES: ivp - Anaphylaxis NSAIDS (Non-Steroidal Anti-Inflammatory Drug) - Anaphylaxis    MEDICATIONS: Elmiron 100 mg capsule 2 capsule Intravesically Q1WK PRN  Aciphex 20 MG Oral Tablet Delayed Release Oral  ALPRAZolam 0.5 MG Oral Tablet Oral  Ambien 5 mg tablet  Crestor 10 MG Oral Tablet Oral  Lamictal  Librax CAPS Oral  Promethazine Hcl 25 mg tablet 1 tablet PO Q 6 H PRN  PROzac 20 MG Oral Capsule Oral  Urogesic-Blue 81.6 mg-40.8 mg-10.8 mg-0.12 mg tablet 1 tablet PO TID PRN     GU PSH: Cystoscopy - 28-Aug-2006 Hysterectomy Unilat SO - 08/28/2006 Renal ESWL - 11/21/2015      PSH Notes: Foot Surgery  Abdominoplasty    NON-GU PSH: Breast Surgery Procedure - 08-28-2006 Cesarean Delivery Only 08/28/2006 Cholecystectomy - 08/27/2012 EXC SKIN ABD ADD-ON - 08/28/2006    GU PMH: Chronic bladder pain (Worsening, Chronic), Elimiron bladder instillation today and PRN for up to 4. If she has not improvement of bladder pain after 4 Elmiron instillations will need to f/u w/Dr. Jeffie Pollock to discuss cysto/HOD - 12/27/2015 Interstitial Cystitis, chronic w/o hematuria (Worsening, Chronic), Culture urine. If pain persist post ABX may need Elmiron bladder instillation next week - 12/20/2015, Chronic interstitial cystitis without  hematuria, - 03/23/2014 Kidney Stone (Stable), Bilateral, F/U in 6 months for KUB and PRN - 12/20/2015, Kidney Stone (Stable) - 11/19/2015, Nephrolithiasis, - 03/23/2014 Urinary Frequency (Worsening), Culture urine and will empirically begin SMZ-TMP DS 1 po BID X 5 days till culture complete Urogesic-blue 81.6 mg 1 po TID PRN - 12/20/2015 Calculus Ureter - 11/26/2015, Right, - 11/19/2015 Acute Cystitis (Worsening), She had >100000 G+ Cocci and 10K dipthroids on her recent culture but hasn't improved with macrobid. - 10/23/2015, Acute Cystitis - 10/15/2015, Acute Cystitis, Acute cystitis without hematuria - 2012/08/27 Dysuria - 10/15/2015, Dysuria, Dysuria - 03/23/2014 Personal Hx Urinary Tract Infections, History of urinary tract infection - 03/23/2014, History of pyelonephritis, - Aug 27, 2013 Candidiasis of vulva and vagina, Vaginal candidiasis - 08/27/2013 Oth GU systems Signs/Symptoms, Bladder pain - 08-27-2012 Urinary Urgency, Urinary urgency - 2012/08/27    NON-GU PMH: Gram-negative sepsis, unspecified, Sepsis, Gram negative - 03/23/2014 Encounter for general adult medical examination without abnormal findings, Encounter for preventive health examination - 2012-08-27 Personal history of other diseases of the digestive system, History of chronic cholecystitis - 08/27/2012, History of esophageal reflux, - 27-Aug-2012 Anxiety, Anxiety - August 27, 2012 Personal history of other endocrine, nutritional and metabolic disease, History of hypercholesterolemia - 08-27-2012    FAMILY HISTORY: Death In The Family Father - Runs In Family Deceased - Father Diabetes - Father, Father, Brother, [place] Status Number - Runs In Family Heart Disease - Father, Father nephrolithiasis - Mother, Mother   SOCIAL HISTORY:  Marital Status: Married Current Smoking Status: Patient has never smoked.  Has never drank.  Does not drink caffeine. Patient's occupation is/was Retired.    REVIEW OF SYSTEMS:    GU Review Female:   Patient reports frequent urination, get up at  night to urinate, stream starts and stops, trouble starting your stream, and have to strain to urinate. Patient denies hard to postpone urination, burning /pain with urination, leakage of urine, and currently pregnant.  Gastrointestinal (Upper):   Patient reports nausea. Patient denies vomiting and indigestion/ heartburn.  Gastrointestinal (Lower):   Patient denies diarrhea and constipation.  Constitutional:   Patient denies fever, night sweats, weight loss, and fatigue.  Skin:   Patient denies skin rash/ lesion and itching.  Eyes:   Patient denies blurred vision and double vision.  Ears/ Nose/ Throat:   Patient denies sore throat and sinus problems.  Hematologic/Lymphatic:   Patient denies swollen glands and easy bruising.  Cardiovascular:   Patient denies leg swelling and chest pains.  Respiratory:   Patient denies cough and shortness of breath.  Endocrine:   Patient denies excessive thirst.  Musculoskeletal:   Patient denies back pain and joint pain.  Neurological:   Patient denies headaches and dizziness.  Psychologic:   Patient reports depression and anxiety.    VITAL SIGNS: None   GU PHYSICAL EXAMINATION:    External Genitalia: No hirsuitism, no rash, no scarring, no cyst, no erythematous lesion, no papular lesion, no blanched lesion, no warty lesion, no labial adhesions, no atrophic introitus. No edema. right labial varix mild.  Urethral Meatus: Normal size. Normal position. No discharge.  Urethra: No tenderness, no mass, no scarring. No hypermobility. No leakage.  Bladder: Normal to palpation, mild tenderness, no mass, normal size.   Vagina: Moderate vaginal atrophy. No stenosis. No rectocele. No cystocele. No enterocele. There is mild levator tenderness bilaterally.  Cervix: S/P Hysterectomy.   Uterus: S/P Hysterectomy.   Adnexa / Parametria: No tenderness. No adnexal mass. Normal left ovary. Normal right ovary.  Anus and Perineum: No hemorrhoids. No anal stenosis. No rectal  fissure, no anal fissure. No edema, no dimple, no perineal tenderness, no anal tenderness.   MULTI-SYSTEM PHYSICAL EXAMINATION:    Constitutional: Well-nourished. No physical deformities. Normally developed. Good grooming.  Neck: Neck symmetrical, not swollen. Normal tracheal position.  Respiratory: No labored breathing, no use of accessory muscles. CTA  Cardiovascular: Normal temperature, normal extremity pulses, no swelling, no varicosities. RRR without murmur.  Gastrointestinal: No mass, mild suprapubic tenderness, no rigidity, non obese abdomen.   Musculoskeletal: Normal gait and station of head and neck.     PAST DATA REVIEWED:  Source Of History:  Patient  X-Ray Review: C.T. Stone Protocol: Reviewed Films. Reviewed Report. 8/17 MRI Lumbar Spine: Reviewed Films. Reviewed Report. May 2017 Lumbar degenerative changes    PROCEDURES:          Urinalysis w/Scope - 81001 Dipstick Dipstick Cont'd Micro  Specimen: Voided Bilirubin: Neg WBC/hpf: 0-5/hpf  Color: Green Ketones: Neg RBC/hpf: 0-2/hpf  Appearance: Clear Blood: Neg Bacteria: NS (Not Seen)  Specific Gravity: 1.020 Protein: 2+ Cystals: NS (Not Seen)  pH: 6.0 Urobilinogen: 0.2 Casts: NS (Not Seen)  Glucose: Neg Nitrites: Neg Trichomonas: Not Present    Leukocyte Esterase: Neg Mucous: Present      Epithelial Cells: 0-5/hpf      Yeast: NS (Not Seen)      Sperm: Not Present    ASSESSMENT:      ICD-10 Details  1 GU:  Interstitial Cystitis, chronic w/o hematuria - N30.10 Worsening - Her symptoms are worse but the tenderness on the exam was relatively mild.   2   Chronic bladder pain - R39.82 Worsening  3   Urinary Frequency - R35.0 Worsening  4   Postmenopausal atrophic vaginitis - N95.2 She has dysparunia and this may contribute.    PLAN:            Medications Refill Meds: Hydrocodone-Acetaminophen 5 mg-325 mg tablet 1 tablet PO Q 6 H PRN   #20  0 Refill(s)            Schedule Return Visit: ASAP - Schedule Surgery           Document Letter(s):  Created for Patient: Clinical Summary         Notes:   I am going to get her set up for a cystoscopy with hydrodistention and instillation of P&M. I reviewed the risks of bleeding, infection, bladder injury, retention, persistent symptoms, thrombotic events and anesthetic complications.   I will have her see our PT dept for the pelvic pain since it is probably multifactoral.   I have suggested she speak to Dr. Nori Riis about the Josph Macho vaginal resurfacing as that can be quite effective for dryness issues.   CC: Dr. Dory Horn and Dr. Mertha Finders.

## 2016-03-03 ENCOUNTER — Ambulatory Visit (HOSPITAL_BASED_OUTPATIENT_CLINIC_OR_DEPARTMENT_OTHER): Payer: BLUE CROSS/BLUE SHIELD | Admitting: Anesthesiology

## 2016-03-03 ENCOUNTER — Ambulatory Visit (HOSPITAL_BASED_OUTPATIENT_CLINIC_OR_DEPARTMENT_OTHER)
Admission: RE | Admit: 2016-03-03 | Discharge: 2016-03-03 | Disposition: A | Discharge status: Home / Self Care | Payer: BLUE CROSS/BLUE SHIELD | Source: Ambulatory Visit | Attending: Urology | Admitting: Urology

## 2016-03-03 ENCOUNTER — Encounter (HOSPITAL_BASED_OUTPATIENT_CLINIC_OR_DEPARTMENT_OTHER)
Admission: RE | Disposition: A | Discharge status: Home / Self Care | Payer: Self-pay | Source: Ambulatory Visit | Attending: Urology

## 2016-03-03 ENCOUNTER — Encounter (HOSPITAL_BASED_OUTPATIENT_CLINIC_OR_DEPARTMENT_OTHER): Payer: Self-pay

## 2016-03-03 DIAGNOSIS — Z87442 Personal history of urinary calculi: Secondary | ICD-10-CM | POA: Diagnosis not present

## 2016-03-03 DIAGNOSIS — Z79899 Other long term (current) drug therapy: Secondary | ICD-10-CM | POA: Insufficient documentation

## 2016-03-03 DIAGNOSIS — N3010 Interstitial cystitis (chronic) without hematuria: Secondary | ICD-10-CM | POA: Diagnosis not present

## 2016-03-03 DIAGNOSIS — Z87440 Personal history of urinary (tract) infections: Secondary | ICD-10-CM | POA: Insufficient documentation

## 2016-03-03 DIAGNOSIS — F419 Anxiety disorder, unspecified: Secondary | ICD-10-CM | POA: Insufficient documentation

## 2016-03-03 DIAGNOSIS — E7800 Pure hypercholesterolemia, unspecified: Secondary | ICD-10-CM | POA: Insufficient documentation

## 2016-03-03 DIAGNOSIS — N952 Postmenopausal atrophic vaginitis: Secondary | ICD-10-CM | POA: Diagnosis not present

## 2016-03-03 DIAGNOSIS — K219 Gastro-esophageal reflux disease without esophagitis: Secondary | ICD-10-CM | POA: Insufficient documentation

## 2016-03-03 HISTORY — DX: Calculus of kidney: N20.0

## 2016-03-03 HISTORY — DX: Generalized anxiety disorder: F41.1

## 2016-03-03 HISTORY — DX: Gastro-esophageal reflux disease without esophagitis: K21.9

## 2016-03-03 HISTORY — DX: Personal history of adenomatous and serrated colon polyps: Z86.0101

## 2016-03-03 HISTORY — DX: Personal history of other diseases of the digestive system: Z87.19

## 2016-03-03 HISTORY — DX: Personal history of colonic polyps: Z86.010

## 2016-03-03 HISTORY — DX: Congenital absence of ovary, unilateral: Q50.01

## 2016-03-03 HISTORY — DX: Personal history of other infectious and parasitic diseases: Z86.19

## 2016-03-03 HISTORY — DX: Other symptoms and signs involving the genitourinary system: R39.89

## 2016-03-03 HISTORY — PX: CYSTO WITH HYDRODISTENSION: SHX5453

## 2016-03-03 LAB — POCT I-STAT, CHEM 8
BUN: 15 mg/dL (ref 6–20)
CREATININE: 0.9 mg/dL (ref 0.44–1.00)
Calcium, Ion: 1.21 mmol/L (ref 1.15–1.40)
Chloride: 107 mmol/L (ref 101–111)
GLUCOSE: 114 mg/dL — AB (ref 65–99)
HEMATOCRIT: 38 % (ref 36.0–46.0)
Hemoglobin: 12.9 g/dL (ref 12.0–15.0)
POTASSIUM: 3.6 mmol/L (ref 3.5–5.1)
Sodium: 143 mmol/L (ref 135–145)
TCO2: 25 mmol/L (ref 0–100)

## 2016-03-03 SURGERY — CYSTOSCOPY, WITH BLADDER HYDRODISTENSION
Anesthesia: General

## 2016-03-03 MED ORDER — ACETAMINOPHEN 325 MG PO TABS
325.0000 mg | ORAL_TABLET | ORAL | Status: DC | PRN
  Filled 2016-03-03: qty 2

## 2016-03-03 MED ORDER — PHENAZOPYRIDINE HCL 200 MG PO TABS
200.0000 mg | ORAL_TABLET | Freq: Once | ORAL | Status: AC
  Administered 2016-03-03: 200 mg via ORAL
  Filled 2016-03-03: qty 1

## 2016-03-03 MED ORDER — PHENAZOPYRIDINE HCL 200 MG PO TABS: 200.0000 mg | ORAL_TABLET | Freq: Three times a day (TID) | ORAL | 0 refills | Status: DC | PRN

## 2016-03-03 MED ORDER — PROPOFOL 10 MG/ML IV BOLUS
INTRAVENOUS | Status: AC
  Filled 2016-03-03: qty 20

## 2016-03-03 MED ORDER — METOCLOPRAMIDE HCL 5 MG/ML IJ SOLN
INTRAMUSCULAR | Status: AC
  Filled 2016-03-03: qty 2

## 2016-03-03 MED ORDER — ONDANSETRON HCL 4 MG/2ML IJ SOLN
INTRAMUSCULAR | Status: DC | PRN
  Administered 2016-03-03: 8 mg via INTRAVENOUS

## 2016-03-03 MED ORDER — FENTANYL CITRATE (PF) 100 MCG/2ML IJ SOLN
25.0000 ug | INTRAMUSCULAR | Status: DC | PRN
  Administered 2016-03-03: 50 ug via INTRAVENOUS
  Filled 2016-03-03: qty 1

## 2016-03-03 MED ORDER — HYDROCODONE-ACETAMINOPHEN 5-325 MG PO TABS: 1.0000 | ORAL_TABLET | ORAL | 0 refills | Status: DC | PRN

## 2016-03-03 MED ORDER — LACTATED RINGERS IV SOLN
INTRAVENOUS | Status: DC
  Administered 2016-03-03: 08:00:00 via INTRAVENOUS
  Filled 2016-03-03: qty 1000

## 2016-03-03 MED ORDER — LIDOCAINE HCL (CARDIAC) 20 MG/ML IV SOLN
INTRAVENOUS | Status: DC | PRN
  Administered 2016-03-03: 100 mg via INTRAVENOUS

## 2016-03-03 MED ORDER — BUPIVACAINE HCL 0.5 % IJ SOLN
INTRAMUSCULAR | Status: DC | PRN
  Administered 2016-03-03: 15 mL

## 2016-03-03 MED ORDER — FENTANYL CITRATE (PF) 100 MCG/2ML IJ SOLN
INTRAMUSCULAR | Status: AC
  Filled 2016-03-03: qty 2

## 2016-03-03 MED ORDER — CEFAZOLIN SODIUM-DEXTROSE 2-4 GM/100ML-% IV SOLN
2.0000 g | INTRAVENOUS | Status: AC
  Administered 2016-03-03: 2 g via INTRAVENOUS
  Filled 2016-03-03: qty 100

## 2016-03-03 MED ORDER — OXYCODONE HCL 5 MG PO TABS
5.0000 mg | ORAL_TABLET | Freq: Once | ORAL | Status: DC | PRN
  Filled 2016-03-03: qty 1

## 2016-03-03 MED ORDER — OXYCODONE HCL 5 MG/5ML PO SOLN
5.0000 mg | Freq: Once | ORAL | Status: DC | PRN
  Filled 2016-03-03: qty 5

## 2016-03-03 MED ORDER — MIDAZOLAM HCL 2 MG/2ML IJ SOLN
INTRAMUSCULAR | Status: AC
  Filled 2016-03-03: qty 2

## 2016-03-03 MED ORDER — PHENAZOPYRIDINE HCL 100 MG PO TABS
ORAL_TABLET | ORAL | Status: AC
  Filled 2016-03-03: qty 2

## 2016-03-03 MED ORDER — ONDANSETRON HCL 4 MG/2ML IJ SOLN
INTRAMUSCULAR | Status: AC
  Filled 2016-03-03: qty 2

## 2016-03-03 MED ORDER — CEFAZOLIN SODIUM-DEXTROSE 2-4 GM/100ML-% IV SOLN
INTRAVENOUS | Status: AC
  Filled 2016-03-03: qty 100

## 2016-03-03 MED ORDER — DEXAMETHASONE SODIUM PHOSPHATE 4 MG/ML IJ SOLN
INTRAMUSCULAR | Status: DC | PRN
  Administered 2016-03-03: 10 mg via INTRAVENOUS

## 2016-03-03 MED ORDER — MIDAZOLAM HCL 5 MG/5ML IJ SOLN
INTRAMUSCULAR | Status: DC | PRN
  Administered 2016-03-03 (×2): 2 mg via INTRAVENOUS

## 2016-03-03 MED ORDER — STERILE WATER FOR IRRIGATION IR SOLN
Status: DC | PRN
  Administered 2016-03-03: 3000 mL

## 2016-03-03 MED ORDER — LIDOCAINE 2% (20 MG/ML) 5 ML SYRINGE
INTRAMUSCULAR | Status: AC
  Filled 2016-03-03: qty 5

## 2016-03-03 MED ORDER — BUPIVACAINE HCL (PF) 0.5 % IJ SOLN
INTRAMUSCULAR | Status: DC | PRN
  Administered 2016-03-03: 09:00:00 via INTRAVESICAL

## 2016-03-03 MED ORDER — FENTANYL CITRATE (PF) 100 MCG/2ML IJ SOLN
INTRAMUSCULAR | Status: DC | PRN
  Administered 2016-03-03 (×4): 25 ug via INTRAVENOUS

## 2016-03-03 MED ORDER — PROPOFOL 10 MG/ML IV BOLUS
INTRAVENOUS | Status: DC | PRN
  Administered 2016-03-03: 50 mg via INTRAVENOUS
  Administered 2016-03-03: 200 mg via INTRAVENOUS

## 2016-03-03 MED ORDER — DEXAMETHASONE SODIUM PHOSPHATE 10 MG/ML IJ SOLN
INTRAMUSCULAR | Status: AC
  Filled 2016-03-03: qty 1

## 2016-03-03 MED ORDER — ACETAMINOPHEN 160 MG/5ML PO SOLN
325.0000 mg | ORAL | Status: DC | PRN
  Filled 2016-03-03: qty 20.3

## 2016-03-03 SURGICAL SUPPLY — 19 items
BAG DRAIN URO-CYSTO SKYTR STRL (DRAIN) ×2 IMPLANT
BAG DRN UROCATH (DRAIN) ×1
CATH ROBINSON RED A/P 16FR (CATHETERS) ×2 IMPLANT
CLOTH BEACON ORANGE TIMEOUT ST (SAFETY) ×2 IMPLANT
ELECT REM PT RETURN 9FT ADLT (ELECTROSURGICAL) ×2
ELECTRODE REM PT RTRN 9FT ADLT (ELECTROSURGICAL) ×1 IMPLANT
GLOVE BIO SURGEON STRL SZ7 (GLOVE) ×3 IMPLANT
GLOVE SURG SS PI 8.0 STRL IVOR (GLOVE) ×2 IMPLANT
GOWN STRL REUS W/ TWL XL LVL3 (GOWN DISPOSABLE) ×1 IMPLANT
GOWN STRL REUS W/TWL XL LVL3 (GOWN DISPOSABLE) ×2
KIT ROOM TURNOVER WOR (KITS) ×2 IMPLANT
MANIFOLD NEPTUNE II (INSTRUMENTS) IMPLANT
NDL SAFETY ECLIPSE 18X1.5 (NEEDLE) ×1 IMPLANT
NEEDLE HYPO 18GX1.5 SHARP (NEEDLE) ×2
NS IRRIG 500ML POUR BTL (IV SOLUTION) IMPLANT
PACK CYSTO (CUSTOM PROCEDURE TRAY) ×2 IMPLANT
SYR 30ML LL (SYRINGE) ×2 IMPLANT
TUBE CONNECTING 12X1/4 (SUCTIONS) IMPLANT
WATER STERILE IRR 3000ML UROMA (IV SOLUTION) ×2 IMPLANT

## 2016-03-03 NOTE — Discharge Instructions (Signed)

## 2016-03-03 NOTE — Transfer of Care (Signed)
Immediate Anesthesia Transfer of Care Note  Patient: Olivia Barker  Procedure(s) Performed: Procedure(s) (LRB): CYSTOSCOPY/HYDRODISTENSION AND INSTILL MARCAINE AND PYRIDIUM (N/A)  Patient Location: PACU  Anesthesia Type: General  Level of Consciousness: awake, sedated, patient cooperative and responds to stimulation  Airway & Oxygen Therapy: Patient Spontanous Breathing and Patient connected to face mask oxygen  Post-op Assessment: Report given to PACU RN, Post -op Vital signs reviewed and stable and Patient moving all extremities  Post vital signs: Reviewed and stable  Complications: No apparent anesthesia complications

## 2016-03-03 NOTE — Anesthesia Procedure Notes (Signed)
Procedure Name: LMA Insertion Date/Time: 03/03/2016 9:02 AM Performed by: Justice Rocher Pre-anesthesia Checklist: Patient identified, Emergency Drugs available, Suction available and Patient being monitored Patient Re-evaluated:Patient Re-evaluated prior to inductionOxygen Delivery Method: Circle system utilized Preoxygenation: Pre-oxygenation with 100% oxygen Intubation Type: IV induction Ventilation: Mask ventilation without difficulty LMA: LMA inserted LMA Size: 4.0 Number of attempts: 1 Airway Equipment and Method: Bite block Placement Confirmation: positive ETCO2 Tube secured with: Tape Dental Injury: Teeth and Oropharynx as per pre-operative assessment  Comments: Teeth and lips as preop

## 2016-03-03 NOTE — Brief Op Note (Signed)
03/03/2016  9:21 AM  PATIENT:  Kirstie Mirza  60 y.o. female  PRE-OPERATIVE DIAGNOSIS:  INTERSTITIAL CYSTITIS  POST-OPERATIVE DIAGNOSIS:  INTERSTITIAL CYSTITIS  PROCEDURE:  Procedure(s): CYSTOSCOPY/HYDRODISTENSION AND INSTILL MARCAINE AND PYRIDIUM (N/A)  SURGEON:  Surgeon(s) and Role:    * Irine Seal, MD - Primary  PHYSICIAN ASSISTANT:   ASSISTANTS: none   ANESTHESIA:   general  EBL:  Total I/O In: 300 [I.V.:300] Out: -   BLOOD ADMINISTERED:none  DRAINS: none   LOCAL MEDICATIONS USED:  MARCAINE     SPECIMEN:  No Specimen  DISPOSITION OF SPECIMEN:  N/A  COUNTS:  YES  TOURNIQUET:  * No tourniquets in log *  DICTATION: .Other Dictation: Dictation Number [id number]  PLAN OF CARE: Discharge to home after PACU  PATIENT DISPOSITION:  PACU - hemodynamically stable.   Delay start of Pharmacological VTE agent (>24hrs) due to surgical blood loss or risk of bleeding: not applicable

## 2016-03-03 NOTE — Anesthesia Preprocedure Evaluation (Signed)
Anesthesia Evaluation  Patient identified by MRN, date of birth, ID band Patient awake    Reviewed: Allergy & Precautions, NPO status , Patient's Chart, lab work & pertinent test results  History of Anesthesia Complications (+) PONV and history of anesthetic complications  Airway Mallampati: II  TM Distance: <3 FB Neck ROM: Full    Dental  (+) Teeth Intact   Pulmonary neg pulmonary ROS,    breath sounds clear to auscultation       Cardiovascular negative cardio ROS   Rhythm:Regular     Neuro/Psych PSYCHIATRIC DISORDERS Depression negative neurological ROS     GI/Hepatic Neg liver ROS, hiatal hernia, GERD  Medicated and Controlled,  Endo/Other  negative endocrine ROS  Renal/GU Renal disease     Musculoskeletal   Abdominal   Peds  Hematology negative hematology ROS (+)   Anesthesia Other Findings   Reproductive/Obstetrics                             Anesthesia Physical Anesthesia Plan  ASA: II  Anesthesia Plan: General   Post-op Pain Management:    Induction: Intravenous  Airway Management Planned: LMA  Additional Equipment: None  Intra-op Plan:   Post-operative Plan: Extubation in OR  Informed Consent: I have reviewed the patients History and Physical, chart, labs and discussed the procedure including the risks, benefits and alternatives for the proposed anesthesia with the patient or authorized representative who has indicated his/her understanding and acceptance.   Dental advisory given  Plan Discussed with: CRNA and Surgeon  Anesthesia Plan Comments:         Anesthesia Quick Evaluation

## 2016-03-03 NOTE — Anesthesia Postprocedure Evaluation (Signed)
Anesthesia Post Note  Patient: Olivia Barker  Procedure(s) Performed: Procedure(s) (LRB): CYSTOSCOPY/HYDRODISTENSION AND INSTILL MARCAINE AND PYRIDIUM (N/A)  Patient location during evaluation: PACU Anesthesia Type: General Level of consciousness: awake Pain management: pain level controlled Respiratory status: spontaneous breathing Cardiovascular status: stable Postop Assessment: no signs of nausea or vomiting Anesthetic complications: no    Last Vitals:  Vitals:   03/03/16 1000 03/03/16 1113  BP: 101/72 106/67  Pulse: 79 90  Resp: 11 16  Temp:  36.7 C    Last Pain:  Vitals:   03/03/16 0945  TempSrc:   PainSc: 0-No pain                 Shelly Shoultz

## 2016-03-03 NOTE — Interval H&P Note (Signed)
History and Physical Interval Note:  03/03/2016 7:24 AM  Olivia Barker  has presented today for surgery, with the diagnosis of INTERSTITIAL CYSTITIS  The various methods of treatment have been discussed with the patient and family. After consideration of risks, benefits and other options for treatment, the patient has consented to  Procedure(s): CYSTOSCOPY/HYDRODISTENSION AND INSTILL MARCAINE AND PYRIDIUM (N/A) as a surgical intervention .  The patient's history has been reviewed, patient examined, no change in status, stable for surgery.  I have reviewed the patient's chart and labs.  Questions were answered to the patient's satisfaction.     [name]

## 2016-03-04 ENCOUNTER — Encounter (HOSPITAL_BASED_OUTPATIENT_CLINIC_OR_DEPARTMENT_OTHER): Payer: Self-pay | Admitting: Urology

## 2016-03-04 NOTE — Op Note (Signed)
NAMERONNESHA, PYNES                 ACCOUNT NO.:  [id number]  MEDICAL RECORD NO.:  [id number]  LOCATION:                                 FACILITY:  PHYSICIAN:  Marshall Cork. Jeffie Pollock, M.D.    DATE OF BIRTH:  Mar 02, 1956  DATE OF PROCEDURE:  03/03/2016 DATE OF DISCHARGE:                              OPERATIVE REPORT   PROCEDURE:  Cystoscopy with hydrodistention of bladder and instillation of Pyridium and Marcaine.  PREOPERATIVE DIAGNOSIS:  Interstitial cystitis.  POSTOPERATIVE DIAGNOSIS:  Interstitial cystitis.  SURGEON:  Marshall Cork. Jeffie Pollock, M.D.  ANESTHESIA:  General.  SPECIMEN:  None.  DRAINS:  None.  BLOOD LOSS:  None.  COMPLICATIONS:  None.  INDICATIONS:  Olivia Barker is a 60 year old white female with a history of interstitial cystitis with recurrent symptoms.  She has elected to undergo a hydrodistention for care and evaluation.  FINDINGS OF PROCEDURE:  She was taken to the operating room where general anesthetic was induced.  She was placed in lithotomy position. Her perineum and genitalia were prepped with Betadine solution, and she was draped in usual sterile fashion.  Cystoscopy was performed using a 23-French scope and 30-degree lens. Examination revealed a normal urethra.  The bladder wall had mild trabeculation.  No tumors, stones, or inflammation were noted.  Ureteral orifices were in the normal anatomic position.  The bladder was then filled under 80 cm of water pressure to capacity and drained.  She had 570 mL capacity under anesthesia.  Subsequent reinspection of the bladder following hydrodistention demonstrated glomerulations in the trigone consistent with interstitial cystitis.  No cracking or Hunner's ulcers were noted.  The bladder was drained and a red rubber catheter was placed.  Her bladder was then instilled with 15 mL of 0.5% Marcaine with 400 mg of crushed Pyridium.  The catheter was removed.  The patient was taken down from lithotomy position.  Her anesthetic  was reversed.  She was moved to recovery in stable condition.  There were no complications.     Marshall Cork. Jeffie Pollock, M.D.   ______________________________ Marshall Cork. Jeffie Pollock, M.D.    JJW/MEDQ  D:  03/03/2016  T:  03/04/2016  Job:  [id number]

## 2016-03-09 DIAGNOSIS — N3010 Interstitial cystitis (chronic) without hematuria: Secondary | ICD-10-CM | POA: Diagnosis not present

## 2016-03-09 DIAGNOSIS — R8271 Bacteriuria: Secondary | ICD-10-CM | POA: Diagnosis not present

## 2016-03-09 DIAGNOSIS — R3982 Chronic bladder pain: Secondary | ICD-10-CM | POA: Diagnosis not present

## 2016-03-19 DIAGNOSIS — N3010 Interstitial cystitis (chronic) without hematuria: Secondary | ICD-10-CM | POA: Diagnosis not present

## 2016-03-30 DIAGNOSIS — K3189 Other diseases of stomach and duodenum: Secondary | ICD-10-CM | POA: Diagnosis not present

## 2016-03-30 DIAGNOSIS — G4709 Other insomnia: Secondary | ICD-10-CM | POA: Diagnosis not present

## 2016-03-30 DIAGNOSIS — M62838 Other muscle spasm: Secondary | ICD-10-CM | POA: Diagnosis not present

## 2016-03-30 DIAGNOSIS — N3010 Interstitial cystitis (chronic) without hematuria: Secondary | ICD-10-CM | POA: Diagnosis not present

## 2016-03-30 DIAGNOSIS — M6281 Muscle weakness (generalized): Secondary | ICD-10-CM | POA: Diagnosis not present

## 2016-03-30 DIAGNOSIS — F411 Generalized anxiety disorder: Secondary | ICD-10-CM | POA: Diagnosis not present

## 2016-03-30 DIAGNOSIS — R110 Nausea: Secondary | ICD-10-CM | POA: Diagnosis not present

## 2016-03-30 DIAGNOSIS — F3342 Major depressive disorder, recurrent, in full remission: Secondary | ICD-10-CM | POA: Diagnosis not present

## 2016-03-30 DIAGNOSIS — K219 Gastro-esophageal reflux disease without esophagitis: Secondary | ICD-10-CM | POA: Diagnosis not present

## 2016-03-30 DIAGNOSIS — Z86010 Personal history of colonic polyps: Secondary | ICD-10-CM | POA: Diagnosis not present

## 2016-03-30 DIAGNOSIS — R278 Other lack of coordination: Secondary | ICD-10-CM | POA: Diagnosis not present

## 2016-04-06 DIAGNOSIS — N3010 Interstitial cystitis (chronic) without hematuria: Secondary | ICD-10-CM | POA: Diagnosis not present

## 2016-04-10 DIAGNOSIS — R3982 Chronic bladder pain: Secondary | ICD-10-CM | POA: Diagnosis not present

## 2016-04-28 DIAGNOSIS — R110 Nausea: Secondary | ICD-10-CM | POA: Diagnosis not present

## 2016-04-28 DIAGNOSIS — K449 Diaphragmatic hernia without obstruction or gangrene: Secondary | ICD-10-CM | POA: Diagnosis not present

## 2016-04-28 DIAGNOSIS — K2970 Gastritis, unspecified, without bleeding: Secondary | ICD-10-CM | POA: Diagnosis not present

## 2016-04-28 DIAGNOSIS — R932 Abnormal findings on diagnostic imaging of liver and biliary tract: Secondary | ICD-10-CM | POA: Diagnosis not present

## 2016-05-18 DIAGNOSIS — F3181 Bipolar II disorder: Secondary | ICD-10-CM | POA: Diagnosis not present

## 2016-06-01 DIAGNOSIS — F3181 Bipolar II disorder: Secondary | ICD-10-CM | POA: Diagnosis not present

## 2016-06-02 DIAGNOSIS — Z23 Encounter for immunization: Secondary | ICD-10-CM | POA: Diagnosis not present

## 2016-06-08 DIAGNOSIS — L719 Rosacea, unspecified: Secondary | ICD-10-CM | POA: Diagnosis not present

## 2016-06-09 DIAGNOSIS — L0390 Cellulitis, unspecified: Secondary | ICD-10-CM | POA: Diagnosis not present

## 2016-06-09 DIAGNOSIS — L989 Disorder of the skin and subcutaneous tissue, unspecified: Secondary | ICD-10-CM | POA: Diagnosis not present

## 2016-06-09 DIAGNOSIS — Z01419 Encounter for gynecological examination (general) (routine) without abnormal findings: Secondary | ICD-10-CM | POA: Diagnosis not present

## 2016-06-09 DIAGNOSIS — R110 Nausea: Secondary | ICD-10-CM | POA: Diagnosis not present

## 2016-06-09 DIAGNOSIS — Z6829 Body mass index (BMI) 29.0-29.9, adult: Secondary | ICD-10-CM | POA: Diagnosis not present

## 2016-06-10 DIAGNOSIS — L0201 Cutaneous abscess of face: Secondary | ICD-10-CM | POA: Diagnosis not present

## 2016-06-15 DIAGNOSIS — L0291 Cutaneous abscess, unspecified: Secondary | ICD-10-CM | POA: Diagnosis not present

## 2016-06-15 DIAGNOSIS — F3181 Bipolar II disorder: Secondary | ICD-10-CM | POA: Diagnosis not present

## 2016-06-16 DIAGNOSIS — F3181 Bipolar II disorder: Secondary | ICD-10-CM | POA: Diagnosis not present

## 2016-06-17 DIAGNOSIS — F3181 Bipolar II disorder: Secondary | ICD-10-CM | POA: Diagnosis not present

## 2016-07-01 DIAGNOSIS — J209 Acute bronchitis, unspecified: Secondary | ICD-10-CM | POA: Diagnosis not present

## 2016-07-01 DIAGNOSIS — J3081 Allergic rhinitis due to animal (cat) (dog) hair and dander: Secondary | ICD-10-CM | POA: Diagnosis not present

## 2016-07-01 DIAGNOSIS — J4520 Mild intermittent asthma, uncomplicated: Secondary | ICD-10-CM | POA: Diagnosis not present

## 2016-07-01 DIAGNOSIS — J301 Allergic rhinitis due to pollen: Secondary | ICD-10-CM | POA: Diagnosis not present

## 2016-07-02 DIAGNOSIS — F3181 Bipolar II disorder: Secondary | ICD-10-CM | POA: Diagnosis not present

## 2016-07-07 DIAGNOSIS — F3181 Bipolar II disorder: Secondary | ICD-10-CM | POA: Diagnosis not present

## 2016-07-13 DIAGNOSIS — F3181 Bipolar II disorder: Secondary | ICD-10-CM | POA: Diagnosis not present

## 2016-07-21 DIAGNOSIS — F3181 Bipolar II disorder: Secondary | ICD-10-CM | POA: Diagnosis not present

## 2016-07-27 DIAGNOSIS — F3181 Bipolar II disorder: Secondary | ICD-10-CM | POA: Diagnosis not present

## 2016-07-30 IMAGING — CR DG ABDOMEN 1V
1 series · 1 of 1 positions shown · non-contrast
Comparison: CT scan from 11/18/2015.

CLINICAL DATA: Pre lithotripsy for right kidney stone.

EXAM:
ABDOMEN - 1 VIEW

[t abdomen supine]
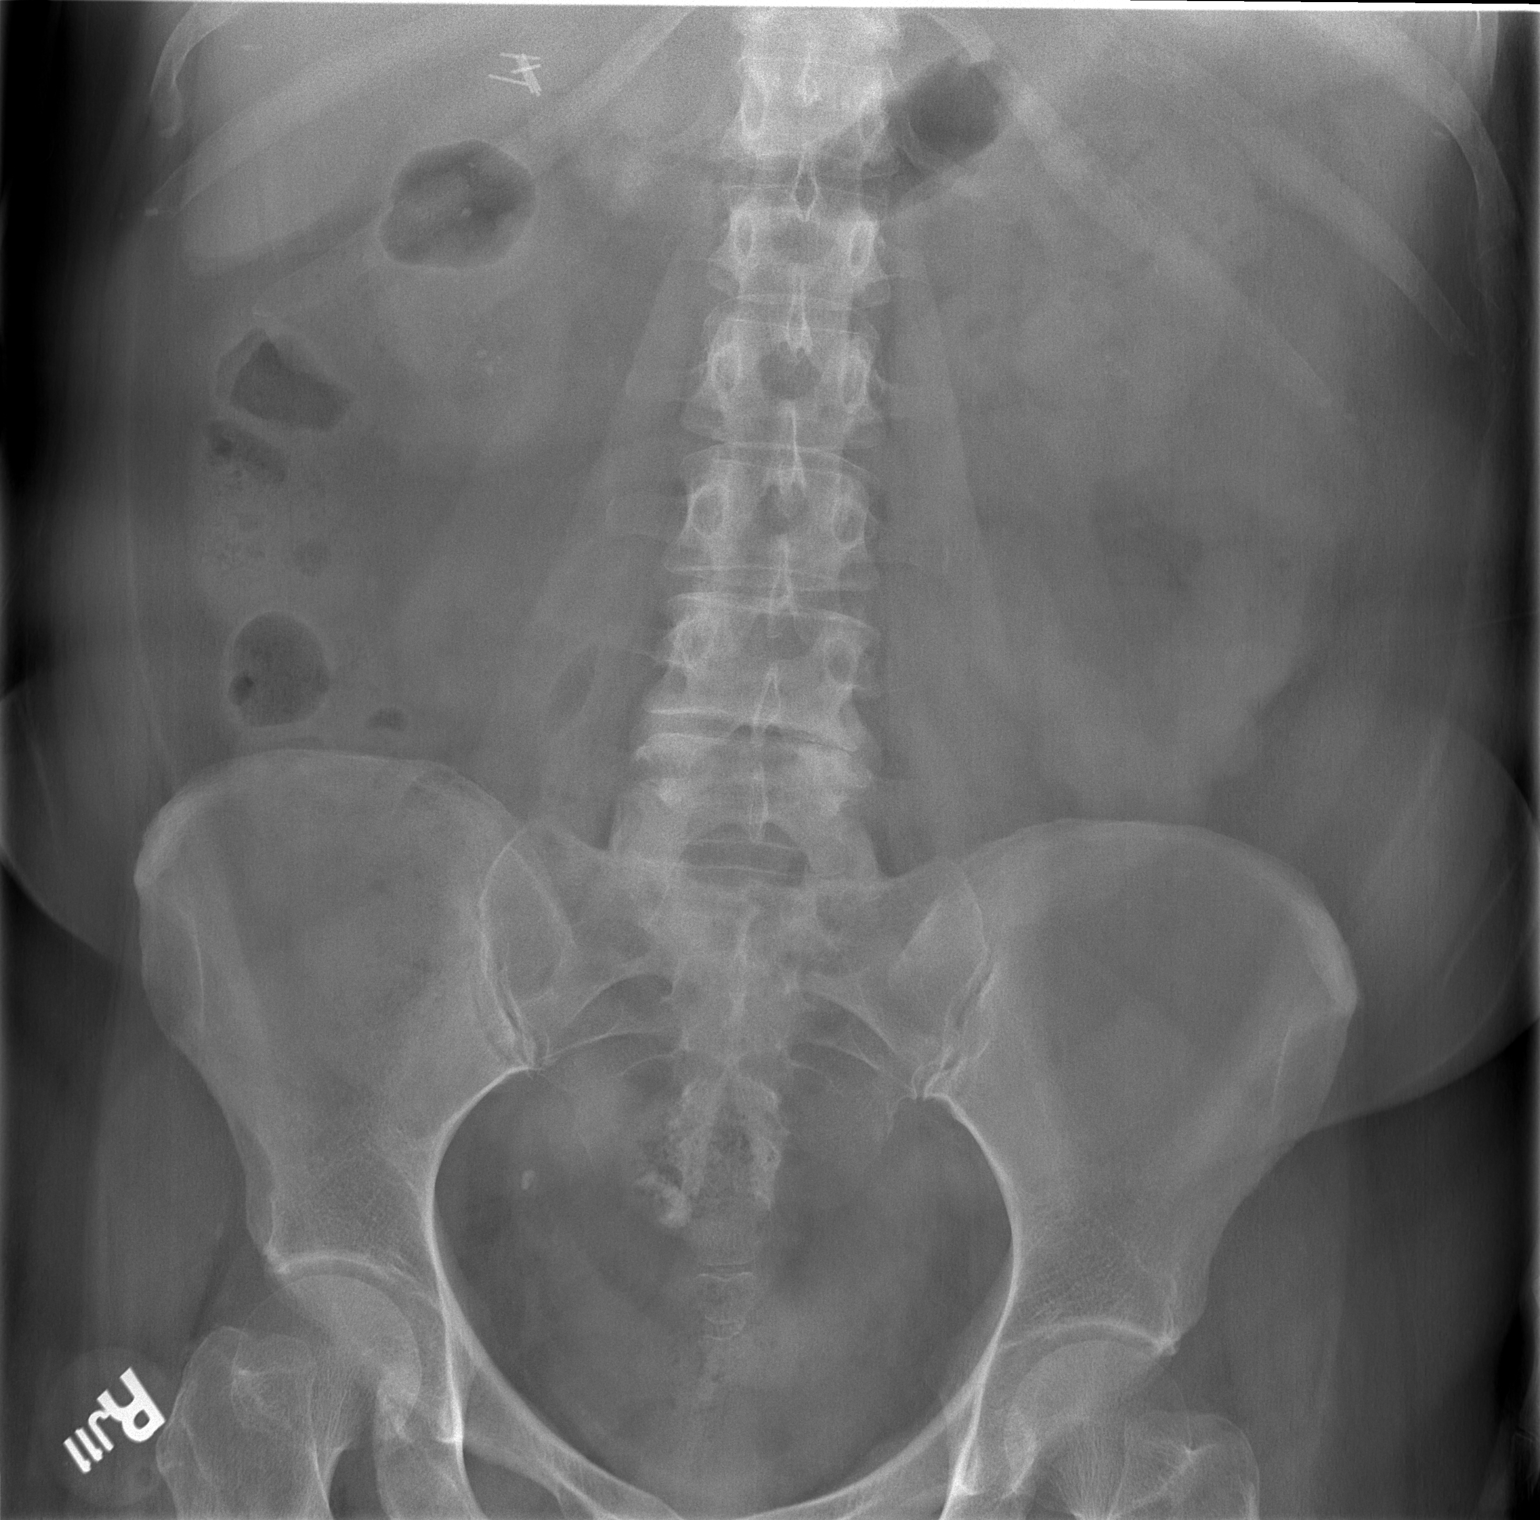

[1 of 1 positions shown; findings below may reference images not displayed]

FINDINGS: Frontal view shows the distal right ureteral stone as confirmed on
the scout image for prior CT scan. Multiple bilateral renal stones
are evident. Normal bowel gas pattern.
IMPRESSION: Persistent, stable position of the distal right ureteral stone.

## 2016-08-10 ENCOUNTER — Other Ambulatory Visit: Payer: Self-pay | Admitting: Internal Medicine

## 2016-08-12 DIAGNOSIS — N3010 Interstitial cystitis (chronic) without hematuria: Secondary | ICD-10-CM | POA: Diagnosis not present

## 2016-08-21 DIAGNOSIS — M7061 Trochanteric bursitis, right hip: Secondary | ICD-10-CM | POA: Diagnosis not present

## 2016-08-28 DIAGNOSIS — F331 Major depressive disorder, recurrent, moderate: Secondary | ICD-10-CM | POA: Diagnosis not present

## 2016-08-28 DIAGNOSIS — F410 Panic disorder [episodic paroxysmal anxiety] without agoraphobia: Secondary | ICD-10-CM | POA: Diagnosis not present

## 2016-09-08 DIAGNOSIS — F3181 Bipolar II disorder: Secondary | ICD-10-CM | POA: Diagnosis not present

## 2016-09-24 DIAGNOSIS — K3184 Gastroparesis: Secondary | ICD-10-CM | POA: Diagnosis not present

## 2016-09-29 ENCOUNTER — Other Ambulatory Visit: Payer: Self-pay | Admitting: Internal Medicine

## 2016-09-29 DIAGNOSIS — N3010 Interstitial cystitis (chronic) without hematuria: Secondary | ICD-10-CM | POA: Diagnosis not present

## 2016-09-29 DIAGNOSIS — R8271 Bacteriuria: Secondary | ICD-10-CM | POA: Diagnosis not present

## 2016-10-12 ENCOUNTER — Telehealth: Payer: Self-pay | Admitting: Internal Medicine

## 2016-10-12 DIAGNOSIS — N390 Urinary tract infection, site not specified: Secondary | ICD-10-CM | POA: Diagnosis not present

## 2016-10-12 DIAGNOSIS — B9620 Unspecified Escherichia coli [E. coli] as the cause of diseases classified elsewhere: Secondary | ICD-10-CM | POA: Diagnosis not present

## 2016-10-12 DIAGNOSIS — I10 Essential (primary) hypertension: Secondary | ICD-10-CM | POA: Insufficient documentation

## 2016-10-12 DIAGNOSIS — N3010 Interstitial cystitis (chronic) without hematuria: Secondary | ICD-10-CM | POA: Diagnosis not present

## 2016-10-12 NOTE — Telephone Encounter (Signed)
Pt states she noticed that her exercise endurance has decreased and fatigue has gradually increased recently. Pt denies acute shortness of breath, chest pain, but does state she seems more short of breath with exercise. It has been more than 1 year since pt has been seen in the office.  Pt has been scheduled to see Richardson Dopp 10/12/16 for further evaluation.

## 2016-10-12 NOTE — Telephone Encounter (Signed)
New message   Pt states she has noticed her endurance is down and she is tired a lot more than normal. She said these are leading to her noticing more SOB.  Pt c/o Shortness Of Breath: STAT if SOB developed within the last 24 hours or pt is noticeably SOB on the phone  1. Are you currently SOB (can you hear that pt is SOB on the phone)? Pt states yes but she just finished working out.  2. How long have you been experiencing SOB? Last few weeks.  3. Are you SOB when sitting or when up moving around?   4. Are you currently experiencing any other symptoms? Just very tired.

## 2016-10-12 NOTE — Progress Notes (Signed)
Cardiology Office Note:    Date:  10/13/2016   ID:  ASANI Olivia Barker, DOB 21-Jan-1956, MRN [id number]  PCP:  Josetta Huddle, MD  Cardiologist:  Dr. Dorris Carnes    Referring MD: Josetta Huddle, MD   Chief Complaint  Patient presents with  . Fatigue    History of Present Illness:    Olivia Barker is a 61 y.o. female with a hx of HTN, HL, gastroparesis.  Myoview in 2013 was normal.  Last seen by Dr. Harrington Challenger in 2/17.    Ms. Olivia Barker returns for evaluation of fatigue and decreased exercise tolerance.  She has noted a 10 lb weight gain over the past month.  She has also noted dyspnea on exertion during her exercise routine. She denies chest pain.  She does feel quite tired and admits to hypersomnolence.  She denies syncope, orthopnea, PND, edema.  She denies fevers, cough, melena, hematochezia, hematuria.    Prior CV studies:   The following studies were reviewed today:  Myoview 10/13 Normal, EF 79  Past Medical History:  Diagnosis Date  . Bladder pain   . Chronic back pain    secondary to IC  . Congenital absence of ovary    per pt unknown which side--   . Depression   . GAD (generalized anxiety disorder)   . Gastroparesis   . GERD (gastroesophageal reflux disease)   . History of adenomatous polyp of colon    2010 tubular adenoma and hyperplastic  . History of gastric polyp    2014 hyperplastic  . History of gastritis   . History of sepsis    urosepsis w/ acute pyelonephritis 09/ 2015  . Hyperlipidemia   . Interstitial cystitis 1992  . Nephrolithiasis    multiple bilateral non-obstructive per abd. xray 11-21-2015  . PONV (postoperative nausea and vomiting)   . Sensation of pressure in bladder area   . Tinnitus    Fluid in ears--  continally    Past Surgical History:  Procedure Laterality Date  . ABDOMINAL HYSTERECTOMY  1998  . ABDOMINOPLASTY  06/02/2001  . BREAST REDUCTION SURGERY Bilateral 2008  . CARDIOVASCULAR STRESS TEST  02-25-2012  dr Dorris Carnes   normal lexiscan  nuclear study w/ no ischemia/  normal LV function and wall function , ef 79%  . CARDIOVASCULAR STRESS TEST  02/25/2012   normal nuclear study w/ no ischemia/  normal LV function and wall motion , ef 79%  . CESAREAN SECTION  06-12-1986  . CHOLECYSTECTOMY  apr 2014  . CYSTO WITH HYDRODISTENSION N/A 03/03/2016   Procedure: CYSTOSCOPY/HYDRODISTENSION AND INSTILL MARCAINE AND PYRIDIUM;  Surgeon: Irine Seal, MD;  Location: [facility];  Service: Urology;  Laterality: N/A;  . CYSTO/ HYDRODISTENTION/ INSTILLATION THERAPY  last one early 1990's  . EUS N/A 06/29/2012   Procedure: ESOPHAGEAL ENDOSCOPIC ULTRASOUND (EUS) RADIAL;  Surgeon: Arta Silence, MD;  Location: [facility];  Service: Endoscopy;  Laterality: N/A;  . EUS N/A 10/04/2013   Procedure: ESOPHAGEAL ENDOSCOPIC ULTRASOUND (EUS) RADIAL;  Surgeon: Arta Silence, MD;  Location: [facility];  Service: Endoscopy;  Laterality: N/A;  . EXTRACORPOREAL SHOCK WAVE LITHOTRIPSY Right 11/21/2015  . ORIF RIGHT 5TH METATARSAL FX  02/04/2001    Current Medications: Current Meds  Medication Sig  . ALPRAZolam (XANAX) 1 MG tablet Take 1 mg by mouth at bedtime as needed for anxiety or sleep.   Marland Kitchen FLUoxetine (PROZAC) 20 MG tablet Take 20 mg by mouth every morning.  Marland Kitchen HYDROcodone-acetaminophen (NORCO/VICODIN) 5-325 MG tablet  Take 1 tablet by mouth every 4 (four) hours as needed for moderate pain.  Marland Kitchen lamoTRIgine (LAMICTAL) 25 MG tablet Take 25 mg by mouth at bedtime.   . nitrofurantoin, macrocrystal-monohydrate, (MACROBID) 100 MG capsule Take 100 mg by mouth at bedtime.   . ondansetron (ZOFRAN ODT) 4 MG disintegrating tablet Take 1 tablet (4 mg total) by mouth every 8 (eight) hours as needed for nausea.  Marland Kitchen oxyCODONE-acetaminophen (PERCOCET) 5-325 MG tablet Take 1 tablet by mouth every 4 (four) hours as needed.  . phenazopyridine (PYRIDIUM) 200 MG tablet Take 1 tablet (200 mg total) by mouth 3 (three) times daily as needed for pain.  . promethazine  (PHENERGAN) 25 MG tablet Take 25 mg by mouth every 6 (six) hours as needed for nausea or vomiting.   . ranitidine (ZANTAC) 150 MG tablet Take 150 mg by mouth as needed for heartburn.  . rosuvastatin (CRESTOR) 20 MG tablet TAKE 1 TABLET ONCE DAILY.  Marland Kitchen triamterene-hydrochlorothiazide (MAXZIDE) 75-50 MG tablet Take 0.5 tablets by mouth daily.  Marland Kitchen zolpidem (AMBIEN) 10 MG tablet Take 10 mg by mouth at bedtime as needed for sleep.      Allergies:   Aspirin; Iohexol; Nsaids; Iodinated diagnostic agents; Macrobid  [nitrofurantoin macrocrystal]; and Other   Social History   Social History  . Marital status: Divorced    Spouse name: N/A  . Number of children: N/A  . Years of education: N/A   Social History Main Topics  . Smoking status: Never Smoker  . Smokeless tobacco: Never Used  . Alcohol use No  . Drug use: No  . Sexual activity: Yes   Other Topics Concern  . None   Social History Narrative  . None     Family Hx: The patient's family history includes Heart attack in her father; Heart disease in her father.  ROS:   Please see the history of present illness.    Review of Systems  Constitution: Positive for weight gain.  HENT: Positive for hearing loss.   Cardiovascular: Positive for dyspnea on exertion.  Respiratory: Positive for snoring.   Hematologic/Lymphatic: Bruises/bleeds easily.  Gastrointestinal: Positive for constipation and nausea.  Neurological: Positive for headaches.  Psychiatric/Behavioral: Positive for depression.   All other systems reviewed and are negative.   EKGs/Labs/Other Test Reviewed:    EKG:  EKG is  ordered today.  The ekg ordered today demonstrates NSR, HR 98, normal axis, QTc 451 ms, no change from 06/17/15  Recent Labs: 03/03/2016: BUN 15; Creatinine, Ser 0.90; Hemoglobin 12.9; Potassium 3.6; Sodium 143   Recent Lipid Panel Lab Results  Component Value Date/Time   CHOL 183 06/17/2015 10:27 AM   CHOL 158 09/18/2013 09:21 AM   TRIG 237 (H)  06/17/2015 10:27 AM   TRIG 214 (H) 09/18/2013 09:21 AM   HDL 51 06/17/2015 10:27 AM   HDL 40 09/18/2013 09:21 AM   CHOLHDL 3.6 06/17/2015 10:27 AM   CHOLHDL 4 09/04/2009 07:58 AM   LDLCALC 85 06/17/2015 10:27 AM   LDLCALC 75 09/18/2013 09:21 AM   LDLDIRECT 214.6 07/02/2009 09:56 AM    Physical Exam:    VS:  BP 124/80   Pulse 98   Ht 5\' 3"  (1.6 m)   Wt 169 lb (76.7 kg)   SpO2 93%   BMI 29.94 kg/m     Wt Readings from Last 3 Encounters:  10/13/16 169 lb (76.7 kg)  03/03/16 167 lb (75.8 kg)  11/21/15 164 lb 1.9 oz (74.4 kg)  Physical Exam  Constitutional: She is oriented to person, place, and time. She appears well-developed and well-nourished. No distress.  HENT:  Head: Normocephalic and atraumatic.  Eyes: No scleral icterus.  Neck: Normal range of motion. No JVD present. No thyromegaly present.  Cardiovascular: Normal rate, regular rhythm, S1 normal, S2 normal and normal heart sounds.   No murmur heard. Pulmonary/Chest: Breath sounds normal. She has no wheezes. She has no rhonchi. She has no rales.  Abdominal: Soft. There is no tenderness.  Musculoskeletal: She exhibits no edema.  Neurological: She is alert and oriented to person, place, and time.  Skin: Skin is warm and dry.  Psychiatric: She has a normal mood and affect.    ASSESSMENT:    1. Other fatigue   2. Shortness of breath   3. Family history of premature CAD   4. Essential hypertension   5. Hyperlipidemia, unspecified hyperlipidemia type    PLAN:    In order of problems listed above:  1. Other fatigue - Etiology not entirely clear.  She feels this is all unusual for her.  She denies chest pain but does have a significant FHx of CAD.  She has not had a stress test in 5 years.   -  Labs: BMET, CBC, TSH  -  Arrange Nuclear stress test   -  If workup unrevealing, consider sleep study  2. Shortness of breath - Etiology not clear. This may be related to weight gain. Will obtain TSH as noted as well  as CBC, BMET.  Given her fatigue and shortness of breath, I will obtain an echocardiogram and a Nuclear stress test.  FU after testing with Dr. Dorris Carnes or me.  If stress test ok and PASP increased on echo, will need to get sleep study.   3. Family history of premature CAD - Arrange stress test as noted to eval fatigue/decreased ex tol and shortness of breath.    4. Essential hypertension - The patient's blood pressure is controlled on her current regimen.  Continue current therapy.    5. Hyperlipidemia, unspecified hyperlipidemia type - Continue statin.    Dispo:  Return in about 4 weeks (around 11/10/2016) for Follow up after testing, w/ Dr. Harrington Challenger or Richardson Dopp, PA-C.   Medication Adjustments/Labs and Tests Ordered: Current medicines are reviewed at length with the patient today.  Concerns regarding medicines are outlined above.  Orders/Tests:  Orders Placed This Encounter  Procedures  . Basic Metabolic Panel (BMET)  . TSH  . CBC  . Myocardial Perfusion Imaging  . EKG 12-Lead  . ECHOCARDIOGRAM COMPLETE   Medication changes: No orders of the defined types were placed in this encounter.  Signed, Richardson Dopp, PA-C  10/13/2016 1:21 PM    [facility] [place], [place], [place]  [address] Phone: [phone]; Fax: [phone]

## 2016-10-13 ENCOUNTER — Ambulatory Visit (INDEPENDENT_AMBULATORY_CARE_PROVIDER_SITE_OTHER): Payer: BLUE CROSS/BLUE SHIELD | Admitting: Physician Assistant

## 2016-10-13 ENCOUNTER — Encounter: Payer: Self-pay | Admitting: Physician Assistant

## 2016-10-13 VITALS — BP 124/80 | HR 98 | Ht 63.0 in | Wt 169.0 lb

## 2016-10-13 DIAGNOSIS — Z8249 Family history of ischemic heart disease and other diseases of the circulatory system: Secondary | ICD-10-CM | POA: Diagnosis not present

## 2016-10-13 DIAGNOSIS — R0602 Shortness of breath: Secondary | ICD-10-CM | POA: Diagnosis not present

## 2016-10-13 DIAGNOSIS — I10 Essential (primary) hypertension: Secondary | ICD-10-CM

## 2016-10-13 DIAGNOSIS — F411 Generalized anxiety disorder: Secondary | ICD-10-CM | POA: Diagnosis not present

## 2016-10-13 DIAGNOSIS — R5383 Other fatigue: Secondary | ICD-10-CM | POA: Diagnosis not present

## 2016-10-13 DIAGNOSIS — E785 Hyperlipidemia, unspecified: Secondary | ICD-10-CM

## 2016-10-13 LAB — BASIC METABOLIC PANEL
BUN / CREAT RATIO: 22 (ref 9–23)
BUN: 16 mg/dL (ref 6–24)
CO2: 22 mmol/L (ref 18–29)
CREATININE: 0.74 mg/dL (ref 0.57–1.00)
Calcium: 9.1 mg/dL (ref 8.7–10.2)
Chloride: 102 mmol/L (ref 96–106)
GFR, EST AFRICAN AMERICAN: 103 mL/min/{1.73_m2} (ref >59)
GFR, EST NON AFRICAN AMERICAN: 89 mL/min/{1.73_m2} (ref >59)
GLUCOSE: 100 mg/dL — AB (ref 65–99)
Potassium: 4 mmol/L (ref 3.5–5.2)
SODIUM: 139 mmol/L (ref 134–144)

## 2016-10-13 LAB — TSH: TSH: 1.02 u[IU]/mL (ref 0.450–4.500)

## 2016-10-13 LAB — CBC
HEMOGLOBIN: 14.1 g/dL (ref 11.1–15.9)
Hematocrit: 40.5 % (ref 34.0–46.6)
MCH: 30.4 pg (ref 26.6–33.0)
MCHC: 34.8 g/dL (ref 31.5–35.7)
MCV: 87 fL (ref 79–97)
Platelets: 237 10*3/uL (ref 150–379)
RBC: 4.64 x10E6/uL (ref 3.77–5.28)
RDW: 13 % (ref 12.3–15.4)
WBC: 7.1 10*3/uL (ref 3.4–10.8)

## 2016-10-13 NOTE — Patient Instructions (Signed)
Medication Instructions:  1. Your physician recommends that you continue on your current medications as directed. Please refer to the Current Medication list given to you today.  Labwork: 1. TODAY BMET, CBC, TSH  Testing/Procedures: 1. Your physician has requested that you have an echocardiogram. Echocardiography is a painless test that uses sound waves to create images of your heart. It provides your doctor with information about the size and shape of your heart and how well your heart's chambers and valves are working. This procedure takes approximately one hour. There are no restrictions for this procedure.  2. Your physician has requested that you have en exercise stress myoview. For further information please visit [email or web address]. Please follow instruction sheet, as given.    Follow-Up: SCOTT WEAVER, PAC IN 4 WEEKS SAME DAY DR. Harrington Challenger IS IN THE OFFICE PER PA  Any Other Special Instructions Will Be Listed Below (If Applicable).     If you need a refill on your cardiac medications before your next appointment, please call your pharmacy.

## 2016-10-14 ENCOUNTER — Telehealth: Payer: Self-pay | Admitting: *Deleted

## 2016-10-14 ENCOUNTER — Telehealth: Payer: Self-pay | Admitting: Physician Assistant

## 2016-10-14 NOTE — Telephone Encounter (Signed)
Operator in our office called me and said pt called and said I lmom message on her cell phone which she states did not give permission to lmom on cell [phone]. I asked the operator to place pt through to me but states pt hung up. I called the pt back on her cell # [phone] and s/w pt in regards to her call a few minutes earlier. I stated to the pt the DPR that is on file and with what number. Pt apologized to me and said it was her error. I stated we have to be very careful when lmom that it is the correct # ok to lmom. Pt apologized again to me.

## 2016-10-14 NOTE — Telephone Encounter (Signed)
Pt has been notified of lab results by phone with verbal understanding. Pt called earlier and told operator that I lmom on her cell # and I was not supposed to lmom on her cell. See previous phone note from earlier today in regards to this. I s/w pt and stated that we had her cell # [phone] on DPR stating ok to lmom. Pt apologized to me. I stated we have to be very careful as to when we lmom to make sure we lmom on right phone.

## 2016-10-14 NOTE — Telephone Encounter (Signed)
DPR ok to [facility]. Lmom lab work good. Continue with current medication regimen. Any questions call [phone].

## 2016-10-14 NOTE — Telephone Encounter (Signed)
-----   Message from Liliane Shi, [place] sent at 10/13/2016  5:42 PM EDT ----- Please call the patient Kidney function is normal. The TSH (thyroid test) is normal. The hemoglobin is normal. Continue with current treatment plan. Richardson Dopp, PA-C   10/13/2016 5:42 PM

## 2016-10-14 NOTE — Telephone Encounter (Signed)
Pt has been notified of lab results by phone with verbal understanding. Pt called earlier and told operator that I lmom on her cell # and I was not supposed to lmom on her cell. See previous phone note from earlier today in regards to this. I s/w pt and stated that we had her cell # [phone] on DPR stating ok to lmom. Pt apologized to me. I stated we have to be very careful as to when we lmom to make sure we lmom on right phone.

## 2016-10-14 NOTE — Telephone Encounter (Signed)
New message ° ° °Pt calling for lab results °

## 2016-10-21 ENCOUNTER — Ambulatory Visit: Payer: Self-pay | Admitting: Physician Assistant

## 2016-10-21 DIAGNOSIS — R0683 Snoring: Secondary | ICD-10-CM | POA: Diagnosis not present

## 2016-10-21 DIAGNOSIS — K3184 Gastroparesis: Secondary | ICD-10-CM | POA: Diagnosis not present

## 2016-10-21 DIAGNOSIS — G4710 Hypersomnia, unspecified: Secondary | ICD-10-CM | POA: Diagnosis not present

## 2016-10-21 DIAGNOSIS — R5383 Other fatigue: Secondary | ICD-10-CM | POA: Diagnosis not present

## 2016-10-26 DIAGNOSIS — N390 Urinary tract infection, site not specified: Secondary | ICD-10-CM | POA: Diagnosis not present

## 2016-10-26 DIAGNOSIS — B955 Unspecified streptococcus as the cause of diseases classified elsewhere: Secondary | ICD-10-CM | POA: Diagnosis not present

## 2016-10-26 DIAGNOSIS — N3010 Interstitial cystitis (chronic) without hematuria: Secondary | ICD-10-CM | POA: Diagnosis not present

## 2016-11-05 ENCOUNTER — Telehealth (HOSPITAL_COMMUNITY): Payer: Self-pay | Admitting: *Deleted

## 2016-11-05 NOTE — Telephone Encounter (Signed)
Patient given detailed instructions per Myocardial Perfusion Study Information Sheet for the test on 11/08/16. Patient notified to arrive 15 minutes early and that it is imperative to arrive on time for appointment to keep from having the test rescheduled.  If you need to cancel or reschedule your appointment, please call the office within 24 hours of your appointment. . Patient verbalized understanding. Kirstie Peri

## 2016-11-09 ENCOUNTER — Other Ambulatory Visit (HOSPITAL_COMMUNITY): Payer: Self-pay

## 2016-11-09 ENCOUNTER — Encounter (HOSPITAL_COMMUNITY): Payer: Self-pay

## 2016-11-12 ENCOUNTER — Encounter (HOSPITAL_COMMUNITY): Payer: Self-pay

## 2016-11-13 ENCOUNTER — Ambulatory Visit: Payer: Self-pay | Admitting: Internal Medicine

## 2016-11-13 ENCOUNTER — Ambulatory Visit: Payer: Self-pay | Admitting: Physician Assistant

## 2016-11-17 DIAGNOSIS — Z6830 Body mass index (BMI) 30.0-30.9, adult: Secondary | ICD-10-CM | POA: Diagnosis not present

## 2016-11-17 DIAGNOSIS — E6609 Other obesity due to excess calories: Secondary | ICD-10-CM | POA: Diagnosis not present

## 2016-11-18 ENCOUNTER — Other Ambulatory Visit (HOSPITAL_COMMUNITY): Payer: Self-pay

## 2016-11-18 ENCOUNTER — Ambulatory Visit: Payer: Self-pay | Admitting: Internal Medicine

## 2016-11-25 ENCOUNTER — Telehealth (HOSPITAL_COMMUNITY): Payer: Self-pay | Admitting: *Deleted

## 2016-11-25 NOTE — Telephone Encounter (Signed)
Left message on voicemail per DPR in reference to upcoming appointment scheduled on 11/30/16 with detailed instructions given per Myocardial Perfusion Study Information Sheet for the test. LM to arrive 15 minutes early, and that it is imperative to arrive on time for appointment to keep from having the test rescheduled. If you need to cancel or reschedule your appointment, please call the office within 24 hours of your appointment. Failure to do so may result in a cancellation of your appointment, and a $50 no show fee. Phone number given for call back for any questions. Amaka Gluth Jacqueline    

## 2016-11-30 ENCOUNTER — Telehealth: Payer: Self-pay | Admitting: *Deleted

## 2016-11-30 ENCOUNTER — Ambulatory Visit (HOSPITAL_BASED_OUTPATIENT_CLINIC_OR_DEPARTMENT_OTHER): Payer: BLUE CROSS/BLUE SHIELD

## 2016-11-30 ENCOUNTER — Ambulatory Visit (HOSPITAL_COMMUNITY): Payer: BLUE CROSS/BLUE SHIELD | Attending: Cardiovascular Disease

## 2016-11-30 ENCOUNTER — Encounter: Payer: Self-pay | Admitting: Physician Assistant

## 2016-11-30 ENCOUNTER — Other Ambulatory Visit: Payer: Self-pay

## 2016-11-30 DIAGNOSIS — R0602 Shortness of breath: Secondary | ICD-10-CM | POA: Diagnosis not present

## 2016-11-30 DIAGNOSIS — Z8249 Family history of ischemic heart disease and other diseases of the circulatory system: Secondary | ICD-10-CM | POA: Insufficient documentation

## 2016-11-30 DIAGNOSIS — R5383 Other fatigue: Secondary | ICD-10-CM | POA: Diagnosis not present

## 2016-11-30 LAB — MYOCARDIAL PERFUSION IMAGING
CHL CUP MPHR: 161 {beats}/min
CSEPED: 7 min
CSEPEDS: 0 s
Estimated workload: 8.5 METS
LHR: 0.31
LV sys vol: 17 mL
LVDIAVOL: 59 mL (ref 46–106)
Peak HR: 142 {beats}/min
Percent HR: 88 %
Rest HR: 94 {beats}/min
SDS: 2
SRS: 1
SSS: 3
TID: 0.82

## 2016-11-30 MED ORDER — TECHNETIUM TC 99M TETROFOSMIN IV KIT
31.9000 | PACK | Freq: Once | INTRAVENOUS | Status: AC | PRN
Start: 2016-11-30 — End: 2016-11-30
  Administered 2016-11-30: 31.9 via INTRAVENOUS
  Filled 2016-11-30: qty 32

## 2016-11-30 MED ORDER — TECHNETIUM TC 99M TETROFOSMIN IV KIT
9.8000 | PACK | Freq: Once | INTRAVENOUS | Status: AC | PRN
  Administered 2016-11-30: 9.8 via INTRAVENOUS
  Filled 2016-11-30: qty 10

## 2016-11-30 NOTE — Telephone Encounter (Signed)
-----   Message from Liliane Shi, [place] sent at 11/30/2016  5:09 PM EDT ----- Please call the patient. Stress test is normal. Continue current treatment plan. Please fax a copy of this study result to her PCP:  Josetta Huddle, MD  Thanks! Richardson Dopp, PA-C    11/30/2016 5:08 PM

## 2016-11-30 NOTE — Telephone Encounter (Signed)
Lmtcb to go over test results.  

## 2016-12-01 ENCOUNTER — Telehealth: Payer: Self-pay | Admitting: *Deleted

## 2016-12-01 NOTE — Telephone Encounter (Signed)
New Message    Pt calling back about results

## 2016-12-01 NOTE — Telephone Encounter (Signed)
Follow Up   Pt calling back about test results

## 2016-12-01 NOTE — Telephone Encounter (Signed)
Pt has been notified of Myoview and Echo results by phone with verbal understanding. Pt thanked me for the good news today. I will forward a copy of test results to PCP Dr. Inda Merlin.

## 2016-12-08 DIAGNOSIS — R112 Nausea with vomiting, unspecified: Secondary | ICD-10-CM | POA: Diagnosis not present

## 2016-12-27 NOTE — Progress Notes (Deleted)
Cardiology Office Note   Date:  12/27/2016   ID:  LAVENA Barker, DOB 09/02/1955, MRN [id number]  PCP:  Olivia Huddle, MD  Cardiologist:   Olivia Carnes, MD   F/U of HL     History of Present Illness: Olivia Barker is a 61 y.o. female with a history of HxTN, gastroparesis and HL  I saw her in 2017  Noted she had a myoview in past  She was seen by Kathleen Argue in June  COmplained of [place]  Recomm labs, Nuclear stress test   Current Outpatient Prescriptions  Medication Sig Dispense Refill  . ALPRAZolam (XANAX) 1 MG tablet Take 1 mg by mouth at bedtime as needed for anxiety or sleep.   0  . FLUoxetine (PROZAC) 20 MG tablet Take 20 mg by mouth every morning.    Marland Kitchen HYDROcodone-acetaminophen (NORCO/VICODIN) 5-325 MG tablet Take 1 tablet by mouth every 4 (four) hours as needed for moderate pain. 15 tablet 0  . lamoTRIgine (LAMICTAL) 25 MG tablet Take 25 mg by mouth at bedtime.   0  . nitrofurantoin, macrocrystal-monohydrate, (MACROBID) 100 MG capsule Take 100 mg by mouth at bedtime.     . ondansetron (ZOFRAN ODT) 4 MG disintegrating tablet Take 1 tablet (4 mg total) by mouth every 8 (eight) hours as needed for nausea. 6 tablet 0  . oxyCODONE-acetaminophen (PERCOCET) 5-325 MG tablet Take 1 tablet by mouth every 4 (four) hours as needed. 20 tablet 0  . phenazopyridine (PYRIDIUM) 200 MG tablet Take 1 tablet (200 mg total) by mouth 3 (three) times daily as needed for pain. 15 tablet 0  . promethazine (PHENERGAN) 25 MG tablet Take 25 mg by mouth every 6 (six) hours as needed for nausea or vomiting.   0  . ranitidine (ZANTAC) 150 MG tablet Take 150 mg by mouth as needed for heartburn.    . rosuvastatin (CRESTOR) 20 MG tablet TAKE 1 TABLET ONCE DAILY. 30 tablet 1  . triamterene-hydrochlorothiazide (MAXZIDE) 75-50 MG tablet Take 0.5 tablets by mouth daily. 45 tablet 3  . zolpidem (AMBIEN) 10 MG tablet Take 10 mg by mouth at bedtime as needed for sleep.      No current facility-administered medications  for this visit.     Allergies:   Aspirin; Iohexol; Nsaids; Iodinated diagnostic agents; Macrobid  [nitrofurantoin macrocrystal]; and Other   Past Medical History:  Diagnosis Date  . Bladder pain   . Chronic back pain    secondary to IC  . Congenital absence of ovary    per pt unknown which side--   . Depression   . GAD (generalized anxiety disorder)   . Gastroparesis   . GERD (gastroesophageal reflux disease)   . History of adenomatous polyp of colon    2010 tubular adenoma and hyperplastic  . History of echocardiogram    Echo 7/18:  EF 55-60, no RWMA  . History of gastric polyp    2014 hyperplastic  . History of gastritis   . History of nuclear stress test    Myoview 7/18: EF 71, no ischemia, low risk  . History of sepsis    urosepsis w/ acute pyelonephritis 09/ 2015  . Hyperlipidemia   . Interstitial cystitis 1992  . Nephrolithiasis    multiple bilateral non-obstructive per abd. xray 11-21-2015  . Sensation of pressure in bladder area   . Tinnitus    Fluid in ears--  continally    Past Surgical History:  Procedure Laterality Date  . ABDOMINAL HYSTERECTOMY  1998  . ABDOMINOPLASTY  06/02/2001  . BREAST REDUCTION SURGERY Bilateral 2008  . CARDIOVASCULAR STRESS TEST  02-25-2012  dr Olivia Barker   normal lexiscan nuclear study w/ no ischemia/  normal LV function and wall function , ef 79%  . CARDIOVASCULAR STRESS TEST  02/25/2012   normal nuclear study w/ no ischemia/  normal LV function and wall motion , ef 79%  . CESAREAN SECTION  06-12-1986  . CHOLECYSTECTOMY  apr 2014  . CYSTO WITH HYDRODISTENSION N/A 03/03/2016   Procedure: CYSTOSCOPY/HYDRODISTENSION AND INSTILL MARCAINE AND PYRIDIUM;  Surgeon: Irine Seal, MD;  Location: [facility];  Service: Urology;  Laterality: N/A;  . CYSTO/ HYDRODISTENTION/ INSTILLATION THERAPY  last one early 1990's  . EUS N/A 06/29/2012   Procedure: ESOPHAGEAL ENDOSCOPIC ULTRASOUND (EUS) RADIAL;  Surgeon: Arta Silence, MD;   Location: [facility];  Service: Endoscopy;  Laterality: N/A;  . EUS N/A 10/04/2013   Procedure: ESOPHAGEAL ENDOSCOPIC ULTRASOUND (EUS) RADIAL;  Surgeon: Arta Silence, MD;  Location: [facility];  Service: Endoscopy;  Laterality: N/A;  . EXTRACORPOREAL SHOCK WAVE LITHOTRIPSY Right 11/21/2015  . ORIF RIGHT 5TH METATARSAL FX  02/04/2001     Social History:  The patient  reports that she has never smoked. She has never used smokeless tobacco. She reports that she does not drink alcohol or use drugs.   Family History:  The patient's family history includes Heart attack in her father; Heart disease in her father.    ROS:  Please see the history of present illness. All other systems are reviewed and  Negative to the above problem except as noted.    PHYSICAL EXAM: VS:  There were no vitals taken for this visit.  GEN: Well nourished, well developed, in no acute distress HEENT: normal Neck: no JVD, carotid bruits, or masses Cardiac: RRR; no murmurs, rubs, or gallops,no edema  Respiratory:  clear to auscultation bilaterally, normal work of breathing GI: soft, nontender, nondistended, + BS  No hepatomegaly  MS: no deformity Moving all extremities   Skin: warm and dry, no rash Neuro:  Strength and sensation are intact Psych: euthymic mood, full affect   EKG:  EKG is ordered today.  SR 90 bpm     Lipid Panel    Component Value Date/Time   CHOL 183 06/17/2015 1027   CHOL 158 09/18/2013 0921   TRIG 237 (H) 06/17/2015 1027   TRIG 214 (H) 09/18/2013 0921   HDL 51 06/17/2015 1027   HDL 40 09/18/2013 0921   CHOLHDL 3.6 06/17/2015 1027   CHOLHDL 4 09/04/2009 0758   VLDL 38.6 09/04/2009 0758   LDLCALC 85 06/17/2015 1027   LDLCALC 75 09/18/2013 0921   LDLDIRECT 214.6 07/02/2009 0956      Wt Readings from Last 3 Encounters:  11/30/16 169 lb (76.7 kg)  10/13/16 169 lb (76.7 kg)  03/03/16 167 lb (75.8 kg)      ASSESSMENT AND PLAN:  1.  HL  Will get fasting panel  2  HTN   Diastolic is marginal on my check  She says it is usually better  Will chec kBMET to confirm K  3.  Glucose regulation  Check HGb A1C       Disposition:   FU with me in 1 year   Signed, Olivia Carnes, MD  12/27/2016 11:24 PM    [address], [place], [place]  [address] Phone: [phone]; Fax: [phone]       Cardiology  Office Note   Date:  12/27/2016   ID:  KHRISTINE VERNO, DOB 1955-05-23, MRN [id number]  PCP:  Olivia Huddle, MD  Cardiologist:   Olivia Carnes, MD   F/U of HL     History of Present Illness: KALLIOPI COUPLAND is a 61 y.o. female with a history of HxTN, gastroparesis and HL  I saw her in 2014  Noted she had a myoview in past  Since seen she was hsop over 1 year ago with urosepsis  She also was seen in ER for GI complaints  She denies CP  Remains very actvie  Works out at gym several times per week No problems with activity  No CP  Breathing is OK        Current Outpatient Prescriptions  Medication Sig Dispense Refill  . ALPRAZolam (XANAX) 1 MG tablet Take 1 mg by mouth at bedtime as needed for anxiety or sleep.   0  . FLUoxetine (PROZAC) 20 MG tablet Take 20 mg by mouth every morning.    Marland Kitchen HYDROcodone-acetaminophen (NORCO/VICODIN) 5-325 MG tablet Take 1 tablet by mouth every 4 (four) hours as needed for moderate pain. 15 tablet 0  . lamoTRIgine (LAMICTAL) 25 MG tablet Take 25 mg by mouth at bedtime.   0  . nitrofurantoin, macrocrystal-monohydrate, (MACROBID) 100 MG capsule Take 100 mg by mouth at bedtime.     . ondansetron (ZOFRAN ODT) 4 MG disintegrating tablet Take 1 tablet (4 mg total) by mouth every 8 (eight) hours as needed for nausea. 6 tablet 0  . oxyCODONE-acetaminophen (PERCOCET) 5-325 MG tablet Take 1 tablet by mouth every 4 (four) hours as needed. 20 tablet 0  . phenazopyridine (PYRIDIUM) 200 MG tablet Take 1 tablet (200 mg total) by mouth 3 (three) times daily as needed for pain. 15 tablet 0  . promethazine  (PHENERGAN) 25 MG tablet Take 25 mg by mouth every 6 (six) hours as needed for nausea or vomiting.   0  . ranitidine (ZANTAC) 150 MG tablet Take 150 mg by mouth as needed for heartburn.    . rosuvastatin (CRESTOR) 20 MG tablet TAKE 1 TABLET ONCE DAILY. 30 tablet 1  . triamterene-hydrochlorothiazide (MAXZIDE) 75-50 MG tablet Take 0.5 tablets by mouth daily. 45 tablet 3  . zolpidem (AMBIEN) 10 MG tablet Take 10 mg by mouth at bedtime as needed for sleep.      No current facility-administered medications for this visit.     Allergies:   Aspirin; Iohexol; Nsaids; Iodinated diagnostic agents; Macrobid  [nitrofurantoin macrocrystal]; and Other   Past Medical History:  Diagnosis Date  . Bladder pain   . Chronic back pain    secondary to IC  . Congenital absence of ovary    per pt unknown which side--   . Depression   . GAD (generalized anxiety disorder)   . Gastroparesis   . GERD (gastroesophageal reflux disease)   . History of adenomatous polyp of colon    2010 tubular adenoma and hyperplastic  . History of echocardiogram    Echo 7/18:  EF 55-60, no RWMA  . History of gastric polyp    2014 hyperplastic  . History of gastritis   . History of nuclear stress test    Myoview 7/18: EF 71, no ischemia, low risk  . History of sepsis    urosepsis w/ acute pyelonephritis 09/ 2015  . Hyperlipidemia   . Interstitial cystitis 1992  . Nephrolithiasis    multiple bilateral non-obstructive per abd. xray 11-21-2015  .  Sensation of pressure in bladder area   . Tinnitus    Fluid in ears--  continally    Past Surgical History:  Procedure Laterality Date  . ABDOMINAL HYSTERECTOMY  1998  . ABDOMINOPLASTY  06/02/2001  . BREAST REDUCTION SURGERY Bilateral 2008  . CARDIOVASCULAR STRESS TEST  02-25-2012  dr Olivia Barker   normal lexiscan nuclear study w/ no ischemia/  normal LV function and wall function , ef 79%  . CARDIOVASCULAR STRESS TEST  02/25/2012   normal nuclear study w/ no ischemia/   normal LV function and wall motion , ef 79%  . CESAREAN SECTION  06-12-1986  . CHOLECYSTECTOMY  apr 2014  . CYSTO WITH HYDRODISTENSION N/A 03/03/2016   Procedure: CYSTOSCOPY/HYDRODISTENSION AND INSTILL MARCAINE AND PYRIDIUM;  Surgeon: Irine Seal, MD;  Location: [facility];  Service: Urology;  Laterality: N/A;  . CYSTO/ HYDRODISTENTION/ INSTILLATION THERAPY  last one early 1990's  . EUS N/A 06/29/2012   Procedure: ESOPHAGEAL ENDOSCOPIC ULTRASOUND (EUS) RADIAL;  Surgeon: Arta Silence, MD;  Location: [facility];  Service: Endoscopy;  Laterality: N/A;  . EUS N/A 10/04/2013   Procedure: ESOPHAGEAL ENDOSCOPIC ULTRASOUND (EUS) RADIAL;  Surgeon: Arta Silence, MD;  Location: [facility];  Service: Endoscopy;  Laterality: N/A;  . EXTRACORPOREAL SHOCK WAVE LITHOTRIPSY Right 11/21/2015  . ORIF RIGHT 5TH METATARSAL FX  02/04/2001     Social History:  The patient  reports that she has never smoked. She has never used smokeless tobacco. She reports that she does not drink alcohol or use drugs.   Family History:  The patient's family history includes Heart attack in her father; Heart disease in her father.    ROS:  Please see the history of present illness. All other systems are reviewed and  Negative to the above problem except as noted.    PHYSICAL EXAM: VS:  There were no vitals taken for this visit.  GEN: Well nourished, well developed, in no acute distress HEENT: normal Neck: no JVD, carotid bruits, or masses Cardiac: RRR; no murmurs, rubs, or gallops,no edema  Respiratory:  clear to auscultation bilaterally, normal work of breathing GI: soft, nontender, nondistended, + BS  No hepatomegaly  MS: no deformity Moving all extremities   Skin: warm and dry, no rash Neuro:  Strength and sensation are intact Psych: euthymic mood, full affect   EKG:  EKG is ordered today.  SR 90 bpm     Lipid Panel    Component Value Date/Time   CHOL 183 06/17/2015 1027   CHOL 158  09/18/2013 0921   TRIG 237 (H) 06/17/2015 1027   TRIG 214 (H) 09/18/2013 0921   HDL 51 06/17/2015 1027   HDL 40 09/18/2013 0921   CHOLHDL 3.6 06/17/2015 1027   CHOLHDL 4 09/04/2009 0758   VLDL 38.6 09/04/2009 0758   LDLCALC 85 06/17/2015 1027   LDLCALC 75 09/18/2013 0921   LDLDIRECT 214.6 07/02/2009 0956      Wt Readings from Last 3 Encounters:  11/30/16 169 lb (76.7 kg)  10/13/16 169 lb (76.7 kg)  03/03/16 167 lb (75.8 kg)      ASSESSMENT AND PLAN:  1.  HL  Will get fasting panel  2  HTN  Diastolic is marginal on my check  She says it is usually better  Will chec kBMET to confirm K  3.  Glucose regulation  Check HGb A1C       Disposition:   FU with me in 1 year   Signed, Olivia Carnes, MD  12/27/2016 11:25 PM    [address], [place], [place]  [address] Phone: [phone]; Fax: [phone]

## 2016-12-28 ENCOUNTER — Ambulatory Visit: Payer: Self-pay | Admitting: Internal Medicine

## 2017-01-04 ENCOUNTER — Ambulatory Visit: Payer: Self-pay | Admitting: Internal Medicine

## 2017-01-04 DIAGNOSIS — F411 Generalized anxiety disorder: Secondary | ICD-10-CM | POA: Diagnosis not present

## 2017-01-04 DIAGNOSIS — F4322 Adjustment disorder with anxiety: Secondary | ICD-10-CM | POA: Diagnosis not present

## 2017-01-28 ENCOUNTER — Ambulatory Visit (INDEPENDENT_AMBULATORY_CARE_PROVIDER_SITE_OTHER): Payer: BLUE CROSS/BLUE SHIELD | Admitting: Internal Medicine

## 2017-01-28 ENCOUNTER — Encounter: Payer: Self-pay | Admitting: Internal Medicine

## 2017-01-28 VITALS — BP 114/70 | HR 105 | Ht 63.0 in | Wt 161.4 lb

## 2017-01-28 DIAGNOSIS — R0609 Other forms of dyspnea: Secondary | ICD-10-CM

## 2017-01-28 DIAGNOSIS — I10 Essential (primary) hypertension: Secondary | ICD-10-CM

## 2017-01-28 DIAGNOSIS — E782 Mixed hyperlipidemia: Secondary | ICD-10-CM | POA: Diagnosis not present

## 2017-01-28 NOTE — Patient Instructions (Signed)
Your physician recommends that you continue on your current medications as directed. Please refer to the Current Medication list given to you today.  Your physician recommends that you return for lab work in: January 2019 (LIPIDS, HEMOGLOBIN A1C, BMET)  Your physician wants you to follow-up in: June 2019 [place].  You will receive a reminder letter in the mail two months in advance. If you don't receive a letter, please call our office to schedule the follow-up appointment.

## 2017-01-28 NOTE — Progress Notes (Signed)
Cardiology Office Note   Date:  01/28/2017   ID:  [name], DOB November 08, 1955, MRN [id number]  PCP:  Olivia Huddle, MD  Cardiologist:   Dorris Carnes, MD   F/U of SOB     History of Present Illness: Olivia Barker is a 61 y.o. female with a history of HxTN, gastroparesis and HL   I saw the pt in Feb 2017   She was seen by Olivia Barker in June 2018  COmplained of fatigue and decreased exercise tolearnce and 10 lb wt gain  Myovue done  Normal   Echo normal    Since seen her wt is down some  She is breathing better  Drinking protein/acai shakes    Current Outpatient Prescriptions  Medication Sig Dispense Refill  . ALPRAZolam (XANAX) 1 MG tablet Take 1 mg by mouth at bedtime as needed for anxiety or sleep.   0  . FLUoxetine (PROZAC) 20 MG tablet Take 20 mg by mouth every morning.    . lamoTRIgine (LAMICTAL) 25 MG tablet Take 25 mg by mouth at bedtime.   0  . ondansetron (ZOFRAN ODT) 4 MG disintegrating tablet Take 1 tablet (4 mg total) by mouth every 8 (eight) hours as needed for nausea. 6 tablet 0  . promethazine (PHENERGAN) 25 MG tablet Take 25 mg by mouth every 6 (six) hours as needed for nausea or vomiting.   0  . ranitidine (ZANTAC) 150 MG tablet Take 150 mg by mouth as needed for heartburn.    . rosuvastatin (CRESTOR) 20 MG tablet TAKE 1 TABLET ONCE DAILY. 30 tablet 1  . triamterene-hydrochlorothiazide (MAXZIDE) 75-50 MG tablet Take 0.5 tablets by mouth daily. 45 tablet 3  . zolpidem (AMBIEN) 10 MG tablet Take 10 mg by mouth at bedtime as needed for sleep.      No current facility-administered medications for this visit.     Allergies:   Aspirin; Iohexol; Nsaids; Iodinated diagnostic agents; Macrobid  [nitrofurantoin macrocrystal]; and Other   Past Medical History:  Diagnosis Date  . Bladder pain   . Chronic back pain    secondary to IC  . Congenital absence of ovary    per pt unknown which side--   . Depression   . GAD (generalized anxiety disorder)   . Gastroparesis   .  GERD (gastroesophageal reflux disease)   . History of adenomatous polyp of colon    2010 tubular adenoma and hyperplastic  . History of echocardiogram    Echo 7/18:  EF 55-60, no RWMA  . History of gastric polyp    2014 hyperplastic  . History of gastritis   . History of nuclear stress test    Myoview 7/18: EF 71, no ischemia, low risk  . History of sepsis    urosepsis w/ acute pyelonephritis 09/ 2015  . Hyperlipidemia   . Interstitial cystitis 1992  . Nephrolithiasis    multiple bilateral non-obstructive per abd. xray 11-21-2015  . Sensation of pressure in bladder area   . Tinnitus    Fluid in ears--  continally    Past Surgical History:  Procedure Laterality Date  . ABDOMINAL HYSTERECTOMY  1998  . ABDOMINOPLASTY  06/02/2001  . BREAST REDUCTION SURGERY Bilateral 2008  . CARDIOVASCULAR STRESS TEST  02-25-2012  dr Dorris Carnes   normal lexiscan nuclear study w/ no ischemia/  normal LV function and wall function , ef 79%  . CARDIOVASCULAR STRESS TEST  02/25/2012   normal nuclear study w/ no ischemia/  normal  LV function and wall motion , ef 79%  . CESAREAN SECTION  06-12-1986  . CHOLECYSTECTOMY  apr 2014  . CYSTO WITH HYDRODISTENSION N/A 03/03/2016   Procedure: CYSTOSCOPY/HYDRODISTENSION AND INSTILL MARCAINE AND PYRIDIUM;  Surgeon: Irine Seal, MD;  Location: [facility];  Service: Urology;  Laterality: N/A;  . CYSTO/ HYDRODISTENTION/ INSTILLATION THERAPY  last one early 1990's  . EUS N/A 06/29/2012   Procedure: ESOPHAGEAL ENDOSCOPIC ULTRASOUND (EUS) RADIAL;  Surgeon: Arta Silence, MD;  Location: [facility];  Service: Endoscopy;  Laterality: N/A;  . EUS N/A 10/04/2013   Procedure: ESOPHAGEAL ENDOSCOPIC ULTRASOUND (EUS) RADIAL;  Surgeon: Arta Silence, MD;  Location: [facility];  Service: Endoscopy;  Laterality: N/A;  . EXTRACORPOREAL SHOCK WAVE LITHOTRIPSY Right 11/21/2015  . ORIF RIGHT 5TH METATARSAL FX  02/04/2001     Social History:  The patient  reports  that she has never smoked. She has never used smokeless tobacco. She reports that she does not drink alcohol or use drugs.   Family History:  The patient's family history includes Heart attack in her father; Heart disease in her father.    ROS:  Please see the history of present illness. All other systems are reviewed and  Negative to the above problem except as noted.    PHYSICAL EXAM: VS:  BP 114/70   Pulse (!) 105   Ht 5\' 3"  (1.6 m)   Wt 161 lb 6.4 oz (73.2 kg)   SpO2 97%   BMI 28.59 kg/m   GEN: Obese 61 yo in no acute distress HEENT: normal Neck: no JVD, carotid bruits, or masses Cardiac: RRR; no murmurs, rubs, or gallops,no edema  Respiratory:  clear to auscultation bilaterally, normal work of breathing GI: soft, nontender, nondistended, + BS  No hepatomegaly   No masses MS: no deformity Moving all extremities   Skin: warm and dry, no rash Neuro:  Strength and sensation are intact Psych: euthymic mood, full affect   EKG:  EKG is not ordered today   Lipid Panel    Component Value Date/Time   CHOL 183 06/17/2015 1027   CHOL 158 09/18/2013 0921   TRIG 237 (H) 06/17/2015 1027   TRIG 214 (H) 09/18/2013 0921   HDL 51 06/17/2015 1027   HDL 40 09/18/2013 0921   CHOLHDL 3.6 06/17/2015 1027   CHOLHDL 4 09/04/2009 0758   VLDL 38.6 09/04/2009 0758   LDLCALC 85 06/17/2015 1027   LDLCALC 75 09/18/2013 0921   LDLDIRECT 214.6 07/02/2009 0956      Wt Readings from Last 3 Encounters:  01/28/17 161 lb 6.4 oz (73.2 kg)  11/30/16 169 lb (76.7 kg)  10/13/16 169 lb (76.7 kg)      ASSESSMENT AND PLAN:  1. Dyspnea  Eval neg  Symptoms improved  Follow  Get back into walking routine    HL  Will get fasting panel in jan when she is back on her routine    2  HTN  BP is good  BMET in Jan    3.  Glucose regulation  Check HGb A1C    F?U in clinic in June     Signed, Dorris Carnes, MD  01/28/2017 8:39 AM    [address], [place], [place]   [address] Phone: [phone]; Fax: [phone]

## 2017-02-02 DIAGNOSIS — F410 Panic disorder [episodic paroxysmal anxiety] without agoraphobia: Secondary | ICD-10-CM | POA: Diagnosis not present

## 2017-02-02 DIAGNOSIS — F4322 Adjustment disorder with anxiety: Secondary | ICD-10-CM | POA: Diagnosis not present

## 2017-03-08 DIAGNOSIS — Z6829 Body mass index (BMI) 29.0-29.9, adult: Secondary | ICD-10-CM | POA: Diagnosis not present

## 2017-03-08 DIAGNOSIS — E6609 Other obesity due to excess calories: Secondary | ICD-10-CM | POA: Diagnosis not present

## 2017-03-16 DIAGNOSIS — Z1231 Encounter for screening mammogram for malignant neoplasm of breast: Secondary | ICD-10-CM | POA: Diagnosis not present

## 2017-04-07 DIAGNOSIS — F410 Panic disorder [episodic paroxysmal anxiety] without agoraphobia: Secondary | ICD-10-CM | POA: Diagnosis not present

## 2017-04-07 DIAGNOSIS — F411 Generalized anxiety disorder: Secondary | ICD-10-CM | POA: Diagnosis not present

## 2017-04-20 ENCOUNTER — Other Ambulatory Visit: Payer: Self-pay | Admitting: Internal Medicine

## 2017-05-18 DIAGNOSIS — N951 Menopausal and female climacteric states: Secondary | ICD-10-CM | POA: Diagnosis not present

## 2017-05-19 ENCOUNTER — Ambulatory Visit (INDEPENDENT_AMBULATORY_CARE_PROVIDER_SITE_OTHER): Payer: BLUE CROSS/BLUE SHIELD | Admitting: Psychiatry

## 2017-05-19 ENCOUNTER — Encounter (HOSPITAL_COMMUNITY): Payer: Self-pay | Admitting: Psychiatry

## 2017-05-19 VITALS — BP 122/74 | HR 112 | Ht 62.0 in | Wt 163.0 lb

## 2017-05-19 DIAGNOSIS — F411 Generalized anxiety disorder: Secondary | ICD-10-CM | POA: Diagnosis not present

## 2017-05-19 DIAGNOSIS — F331 Major depressive disorder, recurrent, moderate: Secondary | ICD-10-CM | POA: Diagnosis not present

## 2017-05-19 DIAGNOSIS — F410 Panic disorder [episodic paroxysmal anxiety] without agoraphobia: Secondary | ICD-10-CM | POA: Diagnosis not present

## 2017-05-19 DIAGNOSIS — Z62810 Personal history of physical and sexual abuse in childhood: Secondary | ICD-10-CM | POA: Diagnosis not present

## 2017-05-19 DIAGNOSIS — Z630 Problems in relationship with spouse or partner: Secondary | ICD-10-CM | POA: Diagnosis not present

## 2017-05-19 DIAGNOSIS — F4010 Social phobia, unspecified: Secondary | ICD-10-CM

## 2017-05-19 DIAGNOSIS — Z62811 Personal history of psychological abuse in childhood: Secondary | ICD-10-CM | POA: Diagnosis not present

## 2017-05-19 MED ORDER — CLONAZEPAM 0.5 MG PO TABS: ORAL_TABLET | ORAL | 0 refills | Status: DC

## 2017-05-19 MED ORDER — VENLAFAXINE HCL ER 37.5 MG PO CP24: ORAL_CAPSULE | ORAL | 0 refills | Status: DC
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
## 2017-05-27 ENCOUNTER — Telehealth (HOSPITAL_COMMUNITY): Payer: Self-pay

## 2017-05-27 ENCOUNTER — Other Ambulatory Visit (HOSPITAL_COMMUNITY): Payer: Self-pay | Admitting: Psychiatry

## 2017-05-27 NOTE — Telephone Encounter (Signed)
Patient called and stated that the Effexor was causing sleepiness and stomach cramping. She did not take it today and would like to know if there is another medication she can try. Please review and advise, thank you

## 2017-05-27 NOTE — Telephone Encounter (Signed)
She can stop Effexor and try Cymbalta 30 mg daily for 1 week and then 60 mg daily.

## 2017-05-28 MED ORDER — DULOXETINE HCL 30 MG PO CPEP: ORAL_CAPSULE | ORAL | 2 refills | Status: DC

## 2017-05-28 NOTE — Telephone Encounter (Signed)
Sent in the new prescription and called the patient to let her know

## 2017-05-28 NOTE — Addendum Note (Signed)
Addended by: Lethea Killings on: 05/28/2017 10:58 AM   Modules accepted: Orders

## 2017-06-04 DIAGNOSIS — F4321 Adjustment disorder with depressed mood: Secondary | ICD-10-CM | POA: Diagnosis not present

## 2017-06-08 ENCOUNTER — Other Ambulatory Visit: Payer: Self-pay | Admitting: Internal Medicine

## 2017-06-16 ENCOUNTER — Ambulatory Visit (HOSPITAL_COMMUNITY): Payer: Self-pay | Admitting: Psychiatry

## 2017-07-05 DIAGNOSIS — R110 Nausea: Secondary | ICD-10-CM | POA: Diagnosis not present

## 2017-08-06 DIAGNOSIS — M7061 Trochanteric bursitis, right hip: Secondary | ICD-10-CM | POA: Diagnosis not present

## 2017-08-17 DIAGNOSIS — F4322 Adjustment disorder with anxiety: Secondary | ICD-10-CM | POA: Diagnosis not present

## 2017-08-24 DIAGNOSIS — R739 Hyperglycemia, unspecified: Secondary | ICD-10-CM | POA: Diagnosis not present

## 2017-08-24 DIAGNOSIS — G43909 Migraine, unspecified, not intractable, without status migrainosus: Secondary | ICD-10-CM | POA: Diagnosis not present

## 2017-08-24 DIAGNOSIS — E669 Obesity, unspecified: Secondary | ICD-10-CM | POA: Diagnosis not present

## 2017-08-24 DIAGNOSIS — E7800 Pure hypercholesterolemia, unspecified: Secondary | ICD-10-CM | POA: Diagnosis not present

## 2017-09-07 DIAGNOSIS — N3010 Interstitial cystitis (chronic) without hematuria: Secondary | ICD-10-CM | POA: Diagnosis not present

## 2017-09-21 DIAGNOSIS — F4310 Post-traumatic stress disorder, unspecified: Secondary | ICD-10-CM | POA: Diagnosis not present

## 2017-09-21 DIAGNOSIS — F422 Mixed obsessional thoughts and acts: Secondary | ICD-10-CM | POA: Diagnosis not present

## 2017-09-21 DIAGNOSIS — F4322 Adjustment disorder with anxiety: Secondary | ICD-10-CM | POA: Diagnosis not present

## 2017-09-22 DIAGNOSIS — F4323 Adjustment disorder with mixed anxiety and depressed mood: Secondary | ICD-10-CM | POA: Diagnosis not present

## 2017-10-05 DIAGNOSIS — F4323 Adjustment disorder with mixed anxiety and depressed mood: Secondary | ICD-10-CM | POA: Diagnosis not present

## 2017-11-04 DIAGNOSIS — K219 Gastro-esophageal reflux disease without esophagitis: Secondary | ICD-10-CM | POA: Diagnosis not present

## 2017-12-01 DIAGNOSIS — R635 Abnormal weight gain: Secondary | ICD-10-CM | POA: Diagnosis not present

## 2017-12-01 DIAGNOSIS — N951 Menopausal and female climacteric states: Secondary | ICD-10-CM | POA: Diagnosis not present

## 2017-12-02 DIAGNOSIS — E781 Pure hyperglyceridemia: Secondary | ICD-10-CM | POA: Diagnosis not present

## 2017-12-02 DIAGNOSIS — Z1331 Encounter for screening for depression: Secondary | ICD-10-CM | POA: Diagnosis not present

## 2017-12-02 DIAGNOSIS — R7303 Prediabetes: Secondary | ICD-10-CM | POA: Diagnosis not present

## 2017-12-02 DIAGNOSIS — E8881 Metabolic syndrome: Secondary | ICD-10-CM | POA: Diagnosis not present

## 2018-01-13 ENCOUNTER — Encounter: Payer: Self-pay | Admitting: Internal Medicine

## 2018-01-28 ENCOUNTER — Ambulatory Visit: Payer: Self-pay | Admitting: Internal Medicine

## 2018-02-13 ENCOUNTER — Emergency Department (HOSPITAL_COMMUNITY)
Admission: EM | Admit: 2018-02-13 | Discharge: 2018-02-13 | Disposition: A | Discharge status: Home / Self Care | Payer: BLUE CROSS/BLUE SHIELD | Attending: Emergency Medicine | Admitting: Emergency Medicine

## 2018-02-13 ENCOUNTER — Other Ambulatory Visit: Payer: Self-pay

## 2018-02-13 ENCOUNTER — Emergency Department (HOSPITAL_COMMUNITY): Payer: BLUE CROSS/BLUE SHIELD

## 2018-02-13 ENCOUNTER — Encounter (HOSPITAL_COMMUNITY): Payer: Self-pay | Admitting: Emergency Medicine

## 2018-02-13 DIAGNOSIS — Q5001 Congenital absence of ovary, unilateral: Secondary | ICD-10-CM | POA: Insufficient documentation

## 2018-02-13 DIAGNOSIS — R109 Unspecified abdominal pain: Secondary | ICD-10-CM | POA: Diagnosis present

## 2018-02-13 DIAGNOSIS — I10 Essential (primary) hypertension: Secondary | ICD-10-CM | POA: Insufficient documentation

## 2018-02-13 DIAGNOSIS — K3184 Gastroparesis: Secondary | ICD-10-CM | POA: Insufficient documentation

## 2018-02-13 DIAGNOSIS — Z79899 Other long term (current) drug therapy: Secondary | ICD-10-CM | POA: Insufficient documentation

## 2018-02-13 LAB — COMPREHENSIVE METABOLIC PANEL
ALT: 30 U/L (ref 0–44)
ANION GAP: 10 (ref 5–15)
AST: 21 U/L (ref 15–41)
Albumin: 3.9 g/dL (ref 3.5–5.0)
Alkaline Phosphatase: 72 U/L (ref 38–126)
BUN: 24 mg/dL — ABNORMAL HIGH (ref 8–23)
CO2: 25 mmol/L (ref 22–32)
Calcium: 9.3 mg/dL (ref 8.9–10.3)
Chloride: 109 mmol/L (ref 98–111)
Creatinine, Ser: 1.11 mg/dL — ABNORMAL HIGH (ref 0.44–1.00)
GFR calc non Af Amer: 52 mL/min — ABNORMAL LOW (ref >60)
Glucose, Bld: 108 mg/dL — ABNORMAL HIGH (ref 70–99)
POTASSIUM: 4.4 mmol/L (ref 3.5–5.1)
Sodium: 144 mmol/L (ref 135–145)
Total Bilirubin: 0.7 mg/dL (ref 0.3–1.2)
Total Protein: 7.2 g/dL (ref 6.5–8.1)

## 2018-02-13 LAB — URINALYSIS, ROUTINE W REFLEX MICROSCOPIC
BILIRUBIN URINE: NEGATIVE
Glucose, UA: NEGATIVE mg/dL
Hgb urine dipstick: NEGATIVE
Ketones, ur: NEGATIVE mg/dL
Nitrite: NEGATIVE
PH: 5 (ref 5.0–8.0)
Protein, ur: NEGATIVE mg/dL
SPECIFIC GRAVITY, URINE: 1.015 (ref 1.005–1.030)

## 2018-02-13 LAB — CBC WITH DIFFERENTIAL/PLATELET
BASOS ABS: 0 10*3/uL (ref 0.0–0.1)
Basophils Relative: 0 %
EOS PCT: 0 %
Eosinophils Absolute: 0.1 10*3/uL (ref 0.0–0.7)
HCT: 44.9 % (ref 36.0–46.0)
Hemoglobin: 14.5 g/dL (ref 12.0–15.0)
Lymphocytes Relative: 7 %
Lymphs Abs: 1.3 10*3/uL (ref 0.7–4.0)
MCH: 28.7 pg (ref 26.0–34.0)
MCHC: 32.3 g/dL (ref 30.0–36.0)
MCV: 88.9 fL (ref 78.0–100.0)
Monocytes Absolute: 1.2 10*3/uL — ABNORMAL HIGH (ref 0.1–1.0)
Monocytes Relative: 6 %
NEUTROS ABS: 16.4 10*3/uL — AB (ref 1.7–7.7)
NEUTROS PCT: 87 %
PLATELETS: 261 10*3/uL (ref 150–400)
RBC: 5.05 MIL/uL (ref 3.87–5.11)
RDW: 12.5 % (ref 11.5–15.5)
WBC: 19 10*3/uL — AB (ref 4.0–10.5)

## 2018-02-13 LAB — LIPASE, BLOOD: Lipase: 44 U/L (ref 11–51)

## 2018-02-13 MED ORDER — SODIUM CHLORIDE 0.9 % IV BOLUS
1000.0000 mL | Freq: Once | INTRAVENOUS | Status: AC
  Administered 2018-02-13: 1000 mL via INTRAVENOUS

## 2018-02-13 MED ORDER — OXYCODONE-ACETAMINOPHEN 5-325 MG PO TABS: 2.0000 | ORAL_TABLET | ORAL | 0 refills | Status: DC | PRN

## 2018-02-13 MED ORDER — PROMETHAZINE HCL 25 MG/ML IJ SOLN: 25.0000 mg | Freq: Three times a day (TID) | INTRAMUSCULAR | 0 refills | Status: DC | PRN

## 2018-02-13 MED ORDER — DIAZEPAM 5 MG PO TABS
5.0000 mg | ORAL_TABLET | Freq: Once | ORAL | Status: AC
  Administered 2018-02-13: 5 mg via ORAL
  Filled 2018-02-13: qty 1

## 2018-02-13 MED ORDER — MORPHINE SULFATE (PF) 4 MG/ML IV SOLN
4.0000 mg | Freq: Once | INTRAVENOUS | Status: AC
  Administered 2018-02-13: 4 mg via INTRAVENOUS
  Filled 2018-02-13: qty 1

## 2018-02-13 MED ORDER — PROMETHAZINE HCL 25 MG/ML IJ SOLN
12.5000 mg | Freq: Once | INTRAMUSCULAR | Status: AC
Start: 2018-02-13 — End: 2018-02-13
  Administered 2018-02-13: 12.5 mg via INTRAVENOUS
  Filled 2018-02-13: qty 1

## 2018-02-13 NOTE — ED Provider Notes (Signed)
[facility] DEPT Provider Note   CSN: [id number] Arrival date & time: 02/13/18  1151     History   Chief Complaint Chief Complaint  Patient presents with  . Flank Pain    HPI Olivia Barker is a 62 y.o. female.  Patient is a 63 year old female who presents with right flank pain.  She had a kidney stone 2 weeks ago when she was in [place] visiting some family.  She states it was a 5 mm stone and was initially seen in the emergency department.  She went to a urologist in [place] who did a stone retrieval and stent placement.  She subsequently followed up 3 days ago with her urologist here in [place], Dr. Jeffie Pollock.  Dr. Jeffie Pollock removed the stent at that time.  She states that she was doing okay until this morning when she woke up with more severe pain in her right back.  She states it feels similar to the pain she was having when she had a kidney stone.  She also has some generalized abdominal discomfort associated with vomiting and diarrhea.  She does have a history of gastroparesis and feels like this is acting up as well.  She denies any known fevers.  No urinary symptoms or difficulty.  Her emesis is nonbloody and nonbilious.  She does state that normally when she has flare up of her gastroparesis, she gets a cocktail of Phenergan, pain medication and Valium.     Past Medical History:  Diagnosis Date  . Bladder pain   . Chronic back pain    secondary to IC  . Congenital absence of ovary    per pt unknown which side--   . Depression   . GAD (generalized anxiety disorder)   . Gastroparesis   . GERD (gastroesophageal reflux disease)   . History of adenomatous polyp of colon    2010 tubular adenoma and hyperplastic  . History of echocardiogram    Echo 7/18:  EF 55-60, no RWMA  . History of gastric polyp    2014 hyperplastic  . History of gastritis   . History of nuclear stress test    Myoview 7/18: EF 71, no ischemia, low risk  . History of sepsis      urosepsis w/ acute pyelonephritis 09/ 2015  . Hyperlipidemia   . Interstitial cystitis 1992  . Nephrolithiasis    multiple bilateral non-obstructive per abd. xray 11-21-2015  . Sensation of pressure in bladder area   . Tinnitus    Fluid in ears--  continally    Patient Active Problem List   Diagnosis Date Noted  . Essential hypertension 10/12/2016  . Pyelonephritis 01/14/2014  . Overweight 01/14/2014  . Generalized anxiety disorder 01/14/2014  . Depression 01/14/2014  . Sepsis ([place]) 01/14/2014  . Acute respiratory failure with hypoxia ([place]) 01/14/2014  . CAP (community acquired pneumonia) 01/14/2014  . History of kidney stones 01/14/2014  . History of hiatal hernia 01/14/2014  . Cardiomegaly 01/14/2014  . Hyperlipidemia 06/24/2009  . GERD 06/21/2009  . GASTROPARESIS 06/21/2009    Past Surgical History:  Procedure Laterality Date  . ABDOMINAL HYSTERECTOMY  1998  . ABDOMINOPLASTY  06/02/2001  . BREAST REDUCTION SURGERY Bilateral 2008  . CARDIOVASCULAR STRESS TEST  02-25-2012  dr Dorris Carnes   normal lexiscan nuclear study w/ no ischemia/  normal LV function and wall function , ef 79%  . CARDIOVASCULAR STRESS TEST  02/25/2012   normal nuclear study w/ no ischemia/  normal LV function  and wall motion , ef 79%  . CESAREAN SECTION  06-12-1986  . CHOLECYSTECTOMY  apr 2014  . CYSTO WITH HYDRODISTENSION N/A 03/03/2016   Procedure: CYSTOSCOPY/HYDRODISTENSION AND INSTILL MARCAINE AND PYRIDIUM;  Surgeon: Irine Seal, MD;  Location: [facility];  Service: Urology;  Laterality: N/A;  . CYSTO/ HYDRODISTENTION/ INSTILLATION THERAPY  last one early 1990's  . EUS N/A 06/29/2012   Procedure: ESOPHAGEAL ENDOSCOPIC ULTRASOUND (EUS) RADIAL;  Surgeon: Arta Silence, MD;  Location: [facility];  Service: Endoscopy;  Laterality: N/A;  . EUS N/A 10/04/2013   Procedure: ESOPHAGEAL ENDOSCOPIC ULTRASOUND (EUS) RADIAL;  Surgeon: Arta Silence, MD;  Location: [facility];  Service:  Endoscopy;  Laterality: N/A;  . EXTRACORPOREAL SHOCK WAVE LITHOTRIPSY Right 11/21/2015  . ORIF RIGHT 5TH METATARSAL FX  02/04/2001     OB History    Gravida  2   Para  2   Term  2   Preterm      AB      Living  2     SAB      TAB      Ectopic      Multiple      Live Births               Home Medications    Prior to Admission medications   Medication Sig Start Date End Date Taking? Authorizing Provider  buPROPion (WELLBUTRIN XL) 150 MG 24 hr tablet Take 150 mg by mouth daily. 01/11/18  Yes [provider]  clonazePAM (KLONOPIN) 0.5 MG tablet Take 1-2 tab as needed for anxiety 05/19/17  Yes Arfeen, Arlyce Harman, MD  ondansetron (ZOFRAN ODT) 4 MG disintegrating tablet Take 1 tablet (4 mg total) by mouth every 8 (eight) hours as needed for nausea. 06/06/14  Yes Tanna Furry, MD  promethazine (PHENERGAN) 25 MG tablet Take 25 mg by mouth every 6 (six) hours as needed for nausea or vomiting.  11/19/15  Yes [provider]  ranitidine (ZANTAC) 150 MG tablet Take 150 mg by mouth as needed for heartburn.   Yes [provider]  rosuvastatin (CRESTOR) 20 MG tablet TAKE 1 TABLET ONCE DAILY. 04/20/17  Yes Fay Records, MD  triamterene-hydrochlorothiazide (MAXZIDE) 75-50 MG tablet TAKE (1/2) TABLET DAILY. 06/08/17  Yes Fay Records, MD  zolpidem (AMBIEN) 10 MG tablet Take 10 mg by mouth at bedtime as needed for sleep.    Yes [provider]  DULoxetine (CYMBALTA) 30 MG capsule Take one (30 mg) capsule daily for one week and then increase to two (60 mg) tablets daily Patient not taking: Reported on 02/13/2018 05/28/17   Arfeen, Arlyce Harman, MD    Family History Family History  Problem Relation Age of Onset  . Heart disease Father   . Heart attack Father   . Suicidality Paternal Grandmother     Social History Social History   Tobacco Use  . Smoking status: Never Smoker  . Smokeless tobacco: Never Used  Substance Use Topics  . Alcohol use: No  . Drug  use: No     Allergies   Aspirin; Iodinated diagnostic agents; Iohexol; Nsaids; Macrobid  [nitrofurantoin macrocrystal]; and Other   Review of Systems Review of Systems  Constitutional: Negative for chills, diaphoresis, fatigue and fever.  HENT: Negative for congestion, rhinorrhea and sneezing.   Eyes: Negative.   Respiratory: Negative for cough, chest tightness and shortness of breath.   Cardiovascular: Negative for chest pain and leg swelling.  Gastrointestinal: Positive for abdominal pain,  nausea and vomiting. Negative for blood in stool and diarrhea.  Genitourinary: Positive for flank pain. Negative for difficulty urinating, frequency and hematuria.  Musculoskeletal: Negative for arthralgias and back pain.  Skin: Negative for rash.  Neurological: Negative for dizziness, speech difficulty, weakness, numbness and headaches.     Physical Exam Updated Vital Signs BP 103/67 Comment: Simultaneous filing. User may not have seen previous data.  Pulse 89 Comment: Simultaneous filing. User may not have seen previous data.  Temp (!) 97.4 F (36.3 C) (Oral) Comment: Simultaneous filing. User may not have seen previous data. Comment (Src): Simultaneous filing. User may not have seen previous data.  Resp 17   SpO2 94% Comment: Simultaneous filing. User may not have seen previous data.  Physical Exam  Constitutional: She is oriented to person, place, and time. She appears well-developed and well-nourished.  HENT:  Head: Normocephalic and atraumatic.  Eyes: Pupils are equal, round, and reactive to light.  Neck: Normal range of motion. Neck supple.  Cardiovascular: Normal rate, regular rhythm and normal heart sounds.  Pulmonary/Chest: Effort normal and breath sounds normal. No respiratory distress. She has no wheezes. She has no rales. She exhibits no tenderness.  Abdominal: Soft. Bowel sounds are normal. There is no tenderness. There is no rebound and no guarding.  Mild right CVA  tenderness and flank pain  Musculoskeletal: Normal range of motion. She exhibits no edema.  Lymphadenopathy:    She has no cervical adenopathy.  Neurological: She is alert and oriented to person, place, and time.  Skin: Skin is warm and dry. No rash noted.  Psychiatric: She has a normal mood and affect.     ED Treatments / Results  Labs (all labs ordered are listed, but only abnormal results are displayed) Labs Reviewed  COMPREHENSIVE METABOLIC PANEL - Abnormal; Notable for the following components:      Result Value   Glucose, Bld 108 (*)    BUN 24 (*)    Creatinine, Ser 1.11 (*)    GFR calc non Af Amer 52 (*)    All other components within normal limits  CBC WITH DIFFERENTIAL/PLATELET - Abnormal; Notable for the following components:   WBC 19.0 (*)    Neutro Abs 16.4 (*)    Monocytes Absolute 1.2 (*)    All other components within normal limits  LIPASE, BLOOD  URINALYSIS, ROUTINE W REFLEX MICROSCOPIC    EKG None  Radiology US Renal  Result Date: 02/13/2018 CLINICAL DATA:  History of urinary tract stones. Question hydronephrosis. EXAM: RENAL / URINARY TRACT ULTRASOUND COMPLETE COMPARISON:  CT abdomen and pelvis 01/31/2018 and 12/20/2015. FINDINGS: Right Kidney: Length: 11.9 cm. Echogenicity within normal limits. No solid mass or hydronephrosis visualized. Punctate echogenic foci within the kidney are likely stones seen on prior CT. 0.9 cm in diameter simple cyst in the upper pole is noted. Left Kidney: Length: 12.3 cm. Echogenicity within normal limits. No mass or hydronephrosis visualized. Tiny stones seen on prior CT scan are difficult to visualize on this examination. Bladder: Appears normal for degree of bladder distention. IMPRESSION: Negative for hydronephrosis in patient with known bilateral renal stones. No acute finding. Electronically Signed   By: Inge Rise M.D.   On: 02/13/2018 13:52    Procedures Procedures (including critical care time)  Medications  Ordered in ED Medications  sodium chloride 0.9 % bolus 1,000 mL (has no administration in time range)  morphine 4 MG/ML injection 4 mg (4 mg Intravenous Given 02/13/18 1257)  promethazine (PHENERGAN) injection 12.5  mg (12.5 mg Intravenous Given 02/13/18 1320)  diazepam (VALIUM) tablet 5 mg (5 mg Oral Given 02/13/18 1321)     Initial Impression / Assessment and Plan / ED Course  I have reviewed the triage vital signs and the nursing notes.  Pertinent labs & imaging results that were available during my care of the patient were reviewed by me and considered in my medical decision making (see chart for details).     Patient is a 62 year old female who had a recent kidney stone status post retrieval and stent placement.  Her stent is subtotally been removed but she woke up this morning with right flank pain and exacerbation of her gastroparesis.  She is feeling much better after treatment in the ED.  She has a very benign abdominal exam.  No fever.  Her white count is elevated but this may be reactive from her vomiting.  Her renal ultrasound does not show any evidence of hydronephrosis.  Her urinalysis is pending.  I feel that if her urinalysis does not indicate infection, she can be discharged home with follow-up with her urologist.  Dr. Sedonia Small to follow up on urine.  Final Clinical Impressions(s) / ED Diagnoses   Final diagnoses:  Right flank pain  Gastroparesis    ED Discharge Orders    None       Malvin Johns, MD 02/13/18 [phone]

## 2018-02-13 NOTE — ED Triage Notes (Signed)
Pt arrived via [facility] from home. Acute: kidney stone for the past 3-4 weeks. Had a stent placed on right side, and was taken out on Wednesday Patient having N/V diarrhea.  4 of zofran on EMS. 500 bolus of NS

## 2018-02-13 NOTE — ED Notes (Signed)
Bed: WA07 Expected date: 02/13/18 Expected time: 11:50 AM Means of arrival: Ambulance Comments: Flank pain

## 2018-02-13 NOTE — ED Notes (Signed)
Pt given 2 cups of cranberry juice

## 2018-02-13 NOTE — Discharge Instructions (Addendum)
You were evaluated in the Emergency Department and after careful evaluation, we did not find any emergent condition requiring admission or further testing in the hospital. ° °Please return to the Emergency Department if you experience any worsening of your condition.  We encourage you to follow up with a primary care provider.  Thank you for allowing us to be a part of your care. °

## 2018-02-15 LAB — URINE CULTURE: Culture: NO GROWTH

## 2018-02-25 ENCOUNTER — Encounter: Payer: Self-pay | Admitting: Physician Assistant

## 2018-02-25 ENCOUNTER — Ambulatory Visit: Payer: BLUE CROSS/BLUE SHIELD | Admitting: Physician Assistant

## 2018-02-25 VITALS — BP 140/70 | HR 105 | Ht 62.0 in | Wt 173.0 lb

## 2018-02-25 DIAGNOSIS — E782 Mixed hyperlipidemia: Secondary | ICD-10-CM

## 2018-02-25 DIAGNOSIS — I10 Essential (primary) hypertension: Secondary | ICD-10-CM

## 2018-02-25 NOTE — Progress Notes (Signed)
Cardiology Office Note:    Date:  02/25/2018   ID:  Olivia Barker, DOB 1956/04/17, MRN [id number]  PCP:  Olivia Huddle, MD  Cardiologist:  Olivia Carnes, MD   Electrophysiologist:  None   Referring MD: Olivia Huddle, MD   Chief Complaint  Patient presents with  . Follow-up    HTN, HLP    History of Present Illness:    Olivia Barker is a 62 y.o. female with hypertension, hyperlipidemia, gastroparesis, family history of CAD.  Nuclear stress test in 2018 was low risk.  Echocardiogram in July 2018 demonstrated normal LV function.  She was last seen by Olivia Barker in September 2018.     Olivia Barker returns for follow up.   She is here alone.  She has not had any chest pain, syncope, paroxysmal nocturnal dyspnea, orthopnea, leg swelling.  She gets short of breath with more moderate to extreme activities. She was recently treated for a kidney stone while visiting [place].  She saw her endocrinologist yesterday for weight gain.  She thinks it was related to her SSRI being increased recently.   Prior CV studies:   The following studies were reviewed today:  Nuclear stress test 11/30/2016 No reversible ischemia. LVEF 71% with normal wall motion. This is a low risk study.  Echo 11/30/2016 EF 55-60, normal wall motion  Myoview 10/13 Normal, EF 79  Past Medical History:  Diagnosis Date  . Bladder pain   . Chronic back pain    secondary to IC  . Congenital absence of ovary    per pt unknown which side--   . Depression   . GAD (generalized anxiety disorder)   . Gastroparesis   . GERD (gastroesophageal reflux disease)   . History of adenomatous polyp of colon    2010 tubular adenoma and hyperplastic  . History of echocardiogram    Echo 7/18:  EF 55-60, no RWMA  . History of gastric polyp    2014 hyperplastic  . History of gastritis   . History of nuclear stress test    Myoview 7/18: EF 71, no ischemia, low risk  . History of sepsis    urosepsis w/ acute pyelonephritis 09/ 2015  .  Hyperlipidemia   . Interstitial cystitis 1992  . Nephrolithiasis    multiple bilateral non-obstructive per abd. xray 11-21-2015  . Sensation of pressure in bladder area   . Tinnitus    Fluid in ears--  continally   Surgical Hx: The patient  has a past surgical history that includes Cesarean section (06-12-1986); EUS (N/A, 06/29/2012); Cholecystectomy (apr 2014); EUS (N/A, 10/04/2013); Abdominoplasty (06/02/2001); Breast reduction surgery (Bilateral, 2008); ORIF RIGHT 5TH METATARSAL FX (02/04/2001); Extracorporeal shock wave lithotripsy (Right, 11/21/2015); Cardiovascular stress test (02-25-2012  dr Olivia Barker); Abdominal hysterectomy (1998); CYSTO/ HYDRODISTENTION/ INSTILLATION THERAPY (last one early 1990's); Cardiovascular stress test (02/25/2012); and cysto with hydrodistension (N/A, 03/03/2016).   Current Medications: Current Meds  Medication Sig  . ALPRAZolam (XANAX) 1 MG tablet Take 1 mg by mouth as needed for anxiety.   Marland Kitchen buPROPion (WELLBUTRIN XL) 150 MG 24 hr tablet Take 150 mg by mouth daily.  . clonazePAM (KLONOPIN) 0.5 MG tablet Take 1-2 tab as needed for anxiety  . ondansetron (ZOFRAN ODT) 4 MG disintegrating tablet Take 1 tablet (4 mg total) by mouth every 8 (eight) hours as needed for nausea.  . promethazine (PHENERGAN) 25 MG tablet Take 25 mg by mouth every 6 (six) hours as needed for nausea or vomiting.   Marland Kitchen  ranitidine (ZANTAC) 150 MG tablet Take 150 mg by mouth as needed for heartburn.  . rosuvastatin (CRESTOR) 20 MG tablet TAKE 1 TABLET ONCE DAILY.  Marland Kitchen triamterene-hydrochlorothiazide (MAXZIDE) 75-50 MG tablet TAKE (1/2) TABLET DAILY.  Marland Kitchen zolpidem (AMBIEN) 10 MG tablet Take 10 mg by mouth at bedtime as needed for sleep.   . [DISCONTINUED] DULoxetine (CYMBALTA) 30 MG capsule Take one (30 mg) capsule daily for one week and then increase to two (60 mg) tablets daily  . [DISCONTINUED] oxyCODONE-acetaminophen (PERCOCET/ROXICET) 5-325 MG tablet Take 2 tablets by mouth every 4 (four) hours  as needed for severe pain.  . [DISCONTINUED] promethazine (PHENERGAN) 25 MG/ML injection Inject 1 mL (25 mg total) into the muscle every 8 (eight) hours as needed for nausea or vomiting.     Allergies:   Aspirin; Iodinated diagnostic agents; Iohexol; Nsaids; Macrobid  [nitrofurantoin macrocrystal]; and Other   Social History   Tobacco Use  . Smoking status: Never Smoker  . Smokeless tobacco: Never Used  Substance Use Topics  . Alcohol use: No  . Drug use: No     Family Hx: The patient's family history includes Heart attack in her father; Heart disease in her father; Suicidality in her paternal grandmother.  ROS:   Please see the history of present illness.    Review of Systems  Constitution: Positive for weight gain.  HENT: Positive for hearing loss.   Respiratory: Positive for shortness of breath.   Gastrointestinal: Positive for abdominal pain and nausea.  Psychiatric/Behavioral: Positive for depression. The patient is nervous/anxious.    All other systems reviewed and are negative.   EKGs/Labs/Other Test Reviewed:    EKG:  EKG is   ordered today.  The ekg ordered today demonstrates sinus tachycardia, HR 105, normal axis, ATc 459, similar to old EKGs.   Recent Labs: 02/13/2018: ALT 30; BUN 24; Creatinine, Ser 1.11; Hemoglobin 14.5; Platelets 261; Potassium 4.4; Sodium 144   Recent Lipid Panel Lab Results  Component Value Date/Time   CHOL 183 06/17/2015 10:27 AM   CHOL 158 09/18/2013 09:21 AM   TRIG 237 (H) 06/17/2015 10:27 AM   TRIG 214 (H) 09/18/2013 09:21 AM   HDL 51 06/17/2015 10:27 AM   HDL 40 09/18/2013 09:21 AM   CHOLHDL 3.6 06/17/2015 10:27 AM   CHOLHDL 4 09/04/2009 07:58 AM   LDLCALC 85 06/17/2015 10:27 AM   LDLCALC 75 09/18/2013 09:21 AM   LDLDIRECT 214.6 07/02/2009 09:56 AM    Physical Exam:    VS:  BP 140/70   Pulse (!) 105   Ht 5\' 2"  (1.575 m)   Wt 173 lb (78.5 kg)   SpO2 95%   BMI 31.64 kg/m     Wt Readings from Last 3 Encounters:    02/25/18 173 lb (78.5 kg)  01/28/17 161 lb 6.4 oz (73.2 kg)  11/30/16 169 lb (76.7 kg)     Physical Exam  Constitutional: She is oriented to person, place, and time. She appears well-developed and well-nourished. No distress.  HENT:  Head: Normocephalic and atraumatic.  Eyes: No scleral icterus.  Neck: No JVD present. Carotid bruit is not present.  Cardiovascular: Normal rate and regular rhythm.  No murmur heard. Pulmonary/Chest: Effort normal. She has no rales.  Abdominal: Soft. She exhibits no distension.  Musculoskeletal: She exhibits no edema.  Neurological: She is alert and oriented to person, place, and time.  Skin: Skin is warm and dry.    ASSESSMENT & PLAN:    Essential hypertension BP and  HR are elevated today.  She is still having some mild pain from her kidney stone.  She also was in a hurry to get here today.  She saw her endocrinologist yesterday for evaluation of weight gain which is felt to be due to an increased dose of her SSRI.  She did have a TSH obtained and those results are pending.  BP at home is typically 120/70s.  Continue to monitor.  Mixed hyperlipidemia LDL optimal on most recent lab work.  Continue current Rx.     Dispo:  Return in about 1 year (around 02/26/2019) for Routine Follow Up, w/ Olivia Barker, or Richardson Dopp, PA-C.   Medication Adjustments/Labs and Tests Ordered: Current medicines are reviewed at length with the patient today.  Concerns regarding medicines are outlined above.  Tests Ordered: Orders Placed This Encounter  Procedures  . EKG 12-Lead   Medication Changes: No orders of the defined types were placed in this encounter.   Signed, Richardson Dopp, PA-C  02/25/2018 10:27 AM    [facility] [place], [place], [place]  [address] Phone: [phone]; Fax: [phone]

## 2018-02-25 NOTE — Patient Instructions (Signed)
Medication Instructions:  Your physician recommends that you continue on your current medications as directed. Please refer to the Current Medication list given to you today.  If you need a refill on your cardiac medications before your next appointment, please call your pharmacy.   Lab work: NONE ORDERED  If you have labs (blood work) drawn today and your tests are completely normal, you will receive your results only by: Marland Kitchen MyChart Message (if you have MyChart) OR . A paper copy in the mail If you have any lab test that is abnormal or we need to change your treatment, we will call you to review the results.  Testing/Procedures: NONE ORDERED TODAY  Follow-Up: At [facility], you and your health needs are our priority.  As part of our continuing mission to provide you with exceptional heart care, we have created designated Provider Care Teams.  These Care Teams include your primary Cardiologist (physician) and Advanced Practice Providers (APPs -  Physician Assistants and Nurse Practitioners) who all work together to provide you with the care you need, when you need it. You will need a follow up appointment in:  1 years.  Please call our office 2 months in advance to schedule this appointment.  You may see Dorris Carnes, MD or one of the following Advanced Practice Providers on your designated Care Team: Richardson Dopp, PA-C [place], [place] . Daune Perch, NP  Any Other Special Instructions Will Be Listed Below (If Applicable).

## 2018-03-17 DIAGNOSIS — K219 Gastro-esophageal reflux disease without esophagitis: Secondary | ICD-10-CM | POA: Diagnosis not present

## 2018-03-17 DIAGNOSIS — K3184 Gastroparesis: Secondary | ICD-10-CM | POA: Diagnosis not present

## 2018-03-22 DIAGNOSIS — F4321 Adjustment disorder with depressed mood: Secondary | ICD-10-CM | POA: Diagnosis not present

## 2018-03-22 DIAGNOSIS — F3342 Major depressive disorder, recurrent, in full remission: Secondary | ICD-10-CM | POA: Diagnosis not present

## 2018-03-22 DIAGNOSIS — F411 Generalized anxiety disorder: Secondary | ICD-10-CM | POA: Diagnosis not present

## 2018-04-11 DIAGNOSIS — R739 Hyperglycemia, unspecified: Secondary | ICD-10-CM | POA: Diagnosis not present

## 2018-04-11 DIAGNOSIS — G43909 Migraine, unspecified, not intractable, without status migrainosus: Secondary | ICD-10-CM | POA: Diagnosis not present

## 2018-04-11 DIAGNOSIS — E669 Obesity, unspecified: Secondary | ICD-10-CM | POA: Diagnosis not present

## 2018-04-21 DIAGNOSIS — N3010 Interstitial cystitis (chronic) without hematuria: Secondary | ICD-10-CM | POA: Diagnosis not present

## 2018-04-21 DIAGNOSIS — N3000 Acute cystitis without hematuria: Secondary | ICD-10-CM | POA: Diagnosis not present

## 2018-05-18 DIAGNOSIS — Z1231 Encounter for screening mammogram for malignant neoplasm of breast: Secondary | ICD-10-CM | POA: Diagnosis not present

## 2018-05-20 DIAGNOSIS — N3010 Interstitial cystitis (chronic) without hematuria: Secondary | ICD-10-CM | POA: Diagnosis not present

## 2018-05-20 DIAGNOSIS — N200 Calculus of kidney: Secondary | ICD-10-CM | POA: Diagnosis not present

## 2018-06-12 ENCOUNTER — Other Ambulatory Visit: Payer: Self-pay | Admitting: Internal Medicine

## 2018-06-14 ENCOUNTER — Other Ambulatory Visit: Payer: Self-pay | Admitting: Internal Medicine

## 2018-08-23 DIAGNOSIS — F4322 Adjustment disorder with anxiety: Secondary | ICD-10-CM | POA: Diagnosis not present

## 2018-10-07 DIAGNOSIS — R8271 Bacteriuria: Secondary | ICD-10-CM | POA: Diagnosis not present

## 2018-10-07 DIAGNOSIS — N3010 Interstitial cystitis (chronic) without hematuria: Secondary | ICD-10-CM | POA: Diagnosis not present

## 2018-10-07 DIAGNOSIS — R350 Frequency of micturition: Secondary | ICD-10-CM | POA: Diagnosis not present

## 2018-10-11 DIAGNOSIS — Z03818 Encounter for observation for suspected exposure to other biological agents ruled out: Secondary | ICD-10-CM | POA: Diagnosis not present

## 2018-10-11 DIAGNOSIS — R7301 Impaired fasting glucose: Secondary | ICD-10-CM | POA: Diagnosis not present

## 2018-10-12 DIAGNOSIS — N3010 Interstitial cystitis (chronic) without hematuria: Secondary | ICD-10-CM | POA: Diagnosis not present

## 2018-10-12 DIAGNOSIS — N3000 Acute cystitis without hematuria: Secondary | ICD-10-CM | POA: Diagnosis not present

## 2018-10-12 DIAGNOSIS — R350 Frequency of micturition: Secondary | ICD-10-CM | POA: Diagnosis not present

## 2018-10-13 DIAGNOSIS — R7301 Impaired fasting glucose: Secondary | ICD-10-CM | POA: Diagnosis not present

## 2018-10-13 DIAGNOSIS — R112 Nausea with vomiting, unspecified: Secondary | ICD-10-CM | POA: Diagnosis not present

## 2018-10-13 DIAGNOSIS — K219 Gastro-esophageal reflux disease without esophagitis: Secondary | ICD-10-CM | POA: Diagnosis not present

## 2018-10-13 DIAGNOSIS — R635 Abnormal weight gain: Secondary | ICD-10-CM | POA: Diagnosis not present

## 2018-10-27 DIAGNOSIS — N3010 Interstitial cystitis (chronic) without hematuria: Secondary | ICD-10-CM | POA: Diagnosis not present

## 2018-10-28 DIAGNOSIS — N3010 Interstitial cystitis (chronic) without hematuria: Secondary | ICD-10-CM | POA: Diagnosis not present

## 2018-10-28 DIAGNOSIS — R8271 Bacteriuria: Secondary | ICD-10-CM | POA: Diagnosis not present

## 2018-11-09 DIAGNOSIS — M48061 Spinal stenosis, lumbar region without neurogenic claudication: Secondary | ICD-10-CM | POA: Diagnosis not present

## 2018-11-17 DIAGNOSIS — M7061 Trochanteric bursitis, right hip: Secondary | ICD-10-CM | POA: Diagnosis not present

## 2018-11-21 DIAGNOSIS — Z20828 Contact with and (suspected) exposure to other viral communicable diseases: Secondary | ICD-10-CM | POA: Diagnosis not present

## 2018-11-21 DIAGNOSIS — R635 Abnormal weight gain: Secondary | ICD-10-CM | POA: Diagnosis not present

## 2018-11-21 DIAGNOSIS — R7301 Impaired fasting glucose: Secondary | ICD-10-CM | POA: Diagnosis not present

## 2018-11-21 DIAGNOSIS — Z03818 Encounter for observation for suspected exposure to other biological agents ruled out: Secondary | ICD-10-CM | POA: Diagnosis not present

## 2018-12-01 DIAGNOSIS — Z713 Dietary counseling and surveillance: Secondary | ICD-10-CM | POA: Diagnosis not present

## 2018-12-01 DIAGNOSIS — Z6834 Body mass index (BMI) 34.0-34.9, adult: Secondary | ICD-10-CM | POA: Diagnosis not present

## 2018-12-01 DIAGNOSIS — R638 Other symptoms and signs concerning food and fluid intake: Secondary | ICD-10-CM | POA: Diagnosis not present

## 2018-12-07 DIAGNOSIS — F4321 Adjustment disorder with depressed mood: Secondary | ICD-10-CM | POA: Diagnosis not present

## 2018-12-12 DIAGNOSIS — E669 Obesity, unspecified: Secondary | ICD-10-CM | POA: Diagnosis not present

## 2018-12-12 DIAGNOSIS — R7301 Impaired fasting glucose: Secondary | ICD-10-CM | POA: Diagnosis not present

## 2018-12-14 ENCOUNTER — Telehealth: Payer: Self-pay | Admitting: Internal Medicine

## 2018-12-14 NOTE — Telephone Encounter (Signed)
New Message  STAT if HR is under 50 or over 120 (normal HR is 60-100 beats per minute)  1) What is your heart rate? 110  2) Do you have a log of your heart rate readings (document readings)?    8/5-110, 8/4-90, 8/3-99   3) Do you have any other symptoms? Feels jittery (PCP increased dosage for buPROPion (WELLBUTRIN XL) 150 MG 24 hr tablet)

## 2018-12-14 NOTE — Telephone Encounter (Signed)
Spoke with patient who is calling today because she is concerned about feeling jittery.  Her normal HR is generally in the 80-90s range, lately has been in the high 90s.  This morning it was 110 bpm.  BP was and has been "normal" per her report.  She denies any palps, dizziness, lightheadedness etc.  States she works out everyday virtually with a Clinical research associate and has had no problems with that.  She has been more stressed lately with all the COVID restrictions and also her boyfriend was recently Dx with renal cancer.  He will be having surgery soon.  Her phychiatrist recently increased for Wellbutrin to 300 mg daily.  She has RX for alprazolam prn.  Reassurance given, advised to call phychiatrist who increased Wellbutrin, continue to monitor HR and BP, notify the office of any s/s and keep appt as scheduled for Monday.  She was grateful for the call back and information.

## 2018-12-18 NOTE — Patient Instructions (Addendum)
Medication Instructions:  START: Metoprolol 25 mg twice a day   If you need a refill on your cardiac medications before your next appointment, please call your pharmacy.   Lab work: None  If you have labs (blood work) drawn today and your tests are completely normal, you will receive your results only by: Marland Kitchen MyChart Message (if you have MyChart) OR . A paper copy in the mail If you have any lab test that is abnormal or we need to change your treatment, we will call you to review the results.  Testing/Procedures: None   Follow-Up: At [facility], you and your health needs are our priority.  As part of our continuing mission to provide you with exceptional heart care, we have created designated Provider Care Teams.  These Care Teams include your primary Cardiologist (physician) and Advanced Practice Providers (APPs -  Physician Assistants and Nurse Practitioners) who all work together to provide you with the care you need, when you need it. You will need a follow up appointment in:  3 months.  Please call our office 2 months in advance to schedule this appointment.  You may see Dorris Carnes, MD or one of the following Advanced Practice Providers on your designated Care Team: Richardson Dopp, PA-C [place], [place] . Daune Perch, NP  Any Other Special Instructions Will Be Listed Below (If Applicable).  -Recommend heart healthy/Mediterranean diet, with whole grains, fruits, vegetables, fish, lean meats, nuts, olive oil and avocado oil.  -Limit salt intake to less than 1500 mg per day.  -Recommend moderate walking, starting slowly with a few minutes and working up to 3-5 times/week for 30-50 minutes each session. Aim for at least 150 minutes.week. Goal should be pace of 3 miles/hours, or walking 1.5 miles in 30 minutes -Recommend avoidance of tobacco products. Avoid excess alcohol. -Keep blood pressure well controlled, ideally less than 130/80.                                   Pecolia Ades, NP

## 2018-12-18 NOTE — Progress Notes (Signed)
Cardiology Office Note:    Date:  12/19/2018   ID:  Olivia Barker, DOB 06/08/1955, MRN [id number]  PCP:  Olivia Huddle, MD  Cardiologist:  Olivia Carnes, MD  Referring MD: Olivia Huddle, MD   Chief Complaint  Patient presents with  . Tachycardia    History of Present Illness:    Olivia Barker is a 63 y.o. female with a past medical history significant for hypertension, hyperlipidemia, gastroparesis and family history of CAD.  She had a nuclear stress test in 2018 that was low risk.  Echocardiogram and 11/2016 demonstrated normal LV function.  She was last seen in the office on 02/25/2018 by Olivia Dopp, PA at which time she was doing fairly well.  She did have some shortness of breath with moderate to extreme activities.  Heart rate was 105 that day.  Blood pressure pressure was also mildly elevated at 140/70.  She was noted to have weight gain felt to be related to increase of SSRI.   Patient called the office on 12/14/2018 with complaints of increased heart rate and feeling jittery.  She also noted recent increase in Wellbutrin to 300 mg daily.   During COVID she has been gaining weight. She has been checking her BP and notes that her HR has been up to low 100's. She works out every day, with a walking video and works with a Merchant navy officer. She has increased stress. She has no chest pain/pressure, shortness of breath, palpitations, lightheadedness, syncope, orthpnea, PND or edema.   She has a feeling of something not quite right. She has always had a high heart rate but it is higher lately.   She lives with a boyfriend who has just been diagnosed with renal cell carcinoma. She has had a lot of stress and admits that she had gotten into eating a lot of junk food and comfort foods. She is being followed by endocrinology with full testing done. TSH was normal, A1c indicating pre-diabetes. She was advised to lose 15 pounds. She has just recently stared to limit her carb and calorie intake.    Her psychiatrist increased her Wellbutrin to 300 mg but this has been reduced back to 150 for the last week. She was also taking phentermine which currently is on hold. She says her HR is still high off of it.   Labs reviewed from [facility] endocrinology 11/21/2018 Creatinine 1.0, BUN 18 Potassium 4.1 Hemoglobin A1c 6.0 Insulin level 61.9 TSH 0.94   Past Medical History:  Diagnosis Date  . Bladder pain   . Chronic back pain    secondary to IC  . Congenital absence of ovary    per pt unknown which side--   . Depression   . GAD (generalized anxiety disorder)   . Gastroparesis   . GERD (gastroesophageal reflux disease)   . History of adenomatous polyp of colon    2010 tubular adenoma and hyperplastic  . History of echocardiogram    Echo 7/18:  EF 55-60, no RWMA  . History of gastric polyp    2014 hyperplastic  . History of gastritis   . History of nuclear stress test    Myoview 7/18: EF 71, no ischemia, low risk  . History of sepsis    urosepsis w/ acute pyelonephritis 09/ 2015  . Hyperlipidemia   . Interstitial cystitis 1992  . Nephrolithiasis    multiple bilateral non-obstructive per abd. xray 11-21-2015  . Sensation of pressure in bladder area   . Tinnitus  Fluid in ears--  continally    Past Surgical History:  Procedure Laterality Date  . ABDOMINAL HYSTERECTOMY  1998  . ABDOMINOPLASTY  06/02/2001  . BREAST REDUCTION SURGERY Bilateral 2008  . CARDIOVASCULAR STRESS TEST  02-25-2012  dr Olivia Barker   normal lexiscan nuclear study w/ no ischemia/  normal LV function and wall function , ef 79%  . CARDIOVASCULAR STRESS TEST  02/25/2012   normal nuclear study w/ no ischemia/  normal LV function and wall motion , ef 79%  . CESAREAN SECTION  06-12-1986  . CHOLECYSTECTOMY  apr 2014  . CYSTO WITH HYDRODISTENSION N/A 03/03/2016   Procedure: CYSTOSCOPY/HYDRODISTENSION AND INSTILL MARCAINE AND PYRIDIUM;  Surgeon: Irine Seal, MD;  Location[facility];   Service: Urology;  Laterality: N/A;  . CYSTO/ HYDRODISTENTION/ INSTILLATION THERAPY  last one early 1990's  . EUS N/A 06/29/2012   Procedure: ESOPHAGEAL ENDOSCOPIC ULTRASOUND (EUS) RADIAL;  Surgeon: Arta Silence, MD;  Location: [facility];  Service: Endoscopy;  Laterality: N/A;  . EUS N/A 10/04/2013   Procedure: ESOPHAGEAL ENDOSCOPIC ULTRASOUND (EUS) RADIAL;  Surgeon: Arta Silence, MD;  Location: [facility];  Service: Endoscopy;  Laterality: N/A;  . EXTRACORPOREAL SHOCK WAVE LITHOTRIPSY Right 11/21/2015  . ORIF RIGHT 5TH METATARSAL FX  02/04/2001    Current Medications: Current Meds  Medication Sig  . ALPRAZolam (XANAX) 1 MG tablet Take 1 mg by mouth as needed for anxiety.   Marland Kitchen buPROPion (WELLBUTRIN XL) 150 MG 24 hr tablet Take 150 mg by mouth daily.  . ondansetron (ZOFRAN ODT) 4 MG disintegrating tablet Take 1 tablet (4 mg total) by mouth every 8 (eight) hours as needed for nausea.  . promethazine (PHENERGAN) 25 MG tablet Take 25 mg by mouth every 6 (six) hours as needed for nausea or vomiting.   . ranitidine (ZANTAC) 150 MG tablet Take 150 mg by mouth as needed for heartburn.  . rosuvastatin (CRESTOR) 20 MG tablet TAKE 1 TABLET ONCE DAILY.  Marland Kitchen triamterene-hydrochlorothiazide (MAXZIDE) 75-50 MG tablet TAKE (1/2) TABLET DAILY.  Marland Kitchen zolpidem (AMBIEN) 10 MG tablet Take 10 mg by mouth at bedtime as needed for sleep.   . [DISCONTINUED] clonazePAM (KLONOPIN) 0.5 MG tablet Take 1-2 tab as needed for anxiety     Allergies:   Aspirin, Iodinated diagnostic agents, Iohexol, Nsaids, Macrobid  [nitrofurantoin macrocrystal], and Other   Social History   Socioeconomic History  . Marital status: Divorced    Spouse name: Not on file  . Number of children: Not on file  . Years of education: Not on file  . Highest education level: Not on file  Occupational History  . Not on file  Social Needs  . Financial resource strain: Not on file  . Food insecurity    Worry: Not on file    Inability: Not  on file  . Transportation needs    Medical: Not on file    Non-medical: Not on file  Tobacco Use  . Smoking status: Never Smoker  . Smokeless tobacco: Never Used  Substance and Sexual Activity  . Alcohol use: No  . Drug use: No  . Sexual activity: Yes  Lifestyle  . Physical activity    Days per week: Not on file    Minutes per session: Not on file  . Stress: Not on file  Relationships  . Social Herbalist on phone: Not on file    Gets together: Not on file    Attends religious service: Not on file  Active member of club or organization: Not on file    Attends meetings of clubs or organizations: Not on file    Relationship status: Not on file  Other Topics Concern  . Not on file  Social History Narrative  . Not on file     Family History: The patient's family history includes Heart attack in her father; Heart disease in her father; Suicidality in her paternal grandmother. ROS:   Please see the history of present illness.     All other systems reviewed and are negative.  EKGs/Labs/Other Studies Reviewed:    The following studies were reviewed today:  Nuclear stress test 11/30/2016 No reversible ischemia. LVEF 71% with normal wall motion. This is a low risk study.  Echo 11/30/2016 EF 55-60, normal wall motion  Myoview 10/13 Normal, EF 79  EKG:  EKG is ordered today.  The ekg ordered today demonstrates sinus tachycardia, 101 bpm, otherwise normal  Recent Labs: 02/13/2018: ALT 30; BUN 24; Creatinine, Ser 1.11; Hemoglobin 14.5; Platelets 261; Potassium 4.4; Sodium 144   Recent Lipid Panel    Component Value Date/Time   CHOL 183 06/17/2015 1027   CHOL 158 09/18/2013 0921   TRIG 237 (H) 06/17/2015 1027   TRIG 214 (H) 09/18/2013 0921   HDL 51 06/17/2015 1027   HDL 40 09/18/2013 0921   CHOLHDL 3.6 06/17/2015 1027   CHOLHDL 4 09/04/2009 0758   VLDL 38.6 09/04/2009 0758   LDLCALC 85 06/17/2015 1027   LDLCALC 75 09/18/2013 0921   LDLDIRECT 214.6  07/02/2009 0956    Physical Exam:    VS:  BP 120/78   Pulse (!) 101   Ht 5\' 2"  (1.575 m)   Wt 181 lb 12.8 oz (82.5 kg)   SpO2 98%   BMI 33.25 kg/m     Wt Readings from Last 3 Encounters:  12/19/18 181 lb 12.8 oz (82.5 kg)  02/25/18 173 lb (78.5 kg)  01/28/17 161 lb 6.4 oz (73.2 kg)     Physical Exam  Constitutional: She is oriented to person, place, and time. She appears well-developed and well-nourished. No distress.  HENT:  Head: Normocephalic and atraumatic.  Neck: Normal range of motion. Neck supple. No JVD present.  Cardiovascular: Regular rhythm, normal heart sounds and intact distal pulses. Tachycardia present. Exam reveals no gallop and no friction rub.  No murmur heard. Pulmonary/Chest: Effort normal and breath sounds normal. No respiratory distress. She has no wheezes. She has no rales.  Abdominal: Soft. Bowel sounds are normal.  Musculoskeletal: Normal range of motion.        General: No edema.  Neurological: She is alert and oriented to person, place, and time.  Skin: Skin is warm and dry.  Psychiatric: She has a normal mood and affect. Her behavior is normal. Judgment and thought content normal.  Vitals reviewed.    ASSESSMENT:    1. Jittery feeling   2. Essential (primary) hypertension   3. Hyperlipidemia, unspecified hyperlipidemia type    PLAN:    In order of problems listed above:  Elevated heart rate and feeling jittery -Pt notes that she has always had a high heart rate, but now over 100 which concerns her.  -History of generalized anxiety disorder on Wellbutrin and as needed Xanax.  Wellbutrin was recently increased from 150 to 300 mg, now back to 150 mg. Pt tried phentermine which is now on hold. Avoid medications like these that increase heart rate.  -TSH normal.  Recent normal potassium level, 4.1. -Switch  to decaf coffee and avoid caffeine. -No anginal or heart failure type symptoms. No need for cardiac testing at this time.  -Start  metoprolol 25 mg BID as she had tolerated in the past.   Hypertension -On triamterene hydrochlorothiazide- originally prescribed for tinnitus which is still present.   Hyperlipidemia -On rosuvastatin 20 mg daily.  Followed by PCP  Obesity -Body mass index is 33.25 kg/m.  -Pt following with endocrinology. Normal TSH, prediabetes per A1c, and elevated insulin levels. -Discussed diet, low carb.   Medication Adjustments/Labs and Tests Ordered: Current medicines are reviewed at length with the patient today.  Concerns regarding medicines are outlined above. Labs and tests ordered and medication changes are outlined in the patient instructions below:  Patient Instructions  Lifestyle Modifications to Prevent and Treat Heart Disease -Recommend heart healthy/Mediterranean diet, with whole grains, fruits, vegetables, fish, lean meats, nuts, olive oil and avocado oil.  -Limit salt intake to less than 1500 mg per day.  -Recommend moderate walking, starting slowly with a few minutes and working up to 3-5 times/week for 30-50 minutes each session. Aim for at least 150 minutes.week. Goal should be pace of 3 miles/hours, or walking 1.5 miles in 30 minutes -Recommend avoidance of tobacco products. Avoid excess alcohol. -Keep blood pressure well controlled, ideally less than 130/80.      Signed, Daune Perch, NP  12/19/2018 11:43 AM    [place]

## 2018-12-19 ENCOUNTER — Ambulatory Visit: Payer: BC Managed Care – PPO | Admitting: Cardiology

## 2018-12-19 ENCOUNTER — Other Ambulatory Visit: Payer: Self-pay

## 2018-12-19 ENCOUNTER — Encounter: Payer: Self-pay | Admitting: Cardiology

## 2018-12-19 VITALS — BP 120/78 | HR 101 | Ht 62.0 in | Wt 181.8 lb

## 2018-12-19 DIAGNOSIS — E6609 Other obesity due to excess calories: Secondary | ICD-10-CM

## 2018-12-19 DIAGNOSIS — R450 Nervousness: Secondary | ICD-10-CM

## 2018-12-19 DIAGNOSIS — Z6833 Body mass index (BMI) 33.0-33.9, adult: Secondary | ICD-10-CM

## 2018-12-19 DIAGNOSIS — I10 Essential (primary) hypertension: Secondary | ICD-10-CM

## 2018-12-19 DIAGNOSIS — E66811 Obesity, class 1: Secondary | ICD-10-CM

## 2018-12-19 DIAGNOSIS — E785 Hyperlipidemia, unspecified: Secondary | ICD-10-CM

## 2018-12-19 MED ORDER — METOPROLOL TARTRATE 25 MG PO TABS: 25.0000 mg | ORAL_TABLET | Freq: Two times a day (BID) | ORAL | 3 refills | Status: DC

## 2018-12-19 NOTE — Progress Notes (Signed)
Pt has next appointment with Dr. Harrington Challenger on 03/10/19.

## 2018-12-19 NOTE — Addendum Note (Signed)
Addended by: Mendel Ryder on: 12/19/2018 04:28 PM   Modules accepted: Orders

## 2018-12-19 NOTE — Progress Notes (Signed)
Please set pt up to see me in 2 to 3 months

## 2019-01-30 ENCOUNTER — Other Ambulatory Visit: Payer: Self-pay

## 2019-01-30 DIAGNOSIS — Z20822 Contact with and (suspected) exposure to covid-19: Secondary | ICD-10-CM

## 2019-01-30 DIAGNOSIS — R6889 Other general symptoms and signs: Secondary | ICD-10-CM | POA: Diagnosis not present

## 2019-02-01 LAB — NOVEL CORONAVIRUS, NAA: SARS-CoV-2, NAA: NOT DETECTED

## 2019-02-14 ENCOUNTER — Other Ambulatory Visit: Payer: Self-pay

## 2019-02-14 DIAGNOSIS — Z20828 Contact with and (suspected) exposure to other viral communicable diseases: Secondary | ICD-10-CM | POA: Diagnosis not present

## 2019-02-14 DIAGNOSIS — Z20822 Contact with and (suspected) exposure to covid-19: Secondary | ICD-10-CM

## 2019-02-16 LAB — NOVEL CORONAVIRUS, NAA: SARS-CoV-2, NAA: NOT DETECTED

## 2019-02-28 ENCOUNTER — Other Ambulatory Visit: Payer: Self-pay

## 2019-02-28 DIAGNOSIS — Z20822 Contact with and (suspected) exposure to covid-19: Secondary | ICD-10-CM

## 2019-03-01 DIAGNOSIS — E663 Overweight: Secondary | ICD-10-CM | POA: Diagnosis not present

## 2019-03-01 DIAGNOSIS — R110 Nausea: Secondary | ICD-10-CM | POA: Diagnosis not present

## 2019-03-01 LAB — NOVEL CORONAVIRUS, NAA: SARS-CoV-2, NAA: NOT DETECTED

## 2019-03-09 ENCOUNTER — Telehealth: Payer: Self-pay | Admitting: Internal Medicine

## 2019-03-09 NOTE — Telephone Encounter (Signed)
I spoke to the patient and she will be travelling on 10/30 and wants to change her OV to a Virtual Video Visit.  I confirmed with her.    TELEPHONE CALL NOTE  This patient has been deemed a candidate for follow-up tele-health visit to limit community exposure during the Covid-19 pandemic. I spoke with the patient via phone to discuss instructions.  A Virtual Office Visit appointment type has been scheduled for 10/30 with Dr Harrington Challenger, with "VIDEO" or "TELEPHONE" in the appointment notes - patient prefers Video type.    Frederik Schmidt, RN 03/09/2019 2:26 PM   Patient has consented to a Virtual Video Visit with Dr Harrington Challenger on 10/30 @ 4:20 pm.

## 2019-03-09 NOTE — Telephone Encounter (Signed)
[id number] 10/29

## 2019-03-09 NOTE — Telephone Encounter (Signed)
New message     Called patient to confirm her of her appt for tomorrow.  Patient is out of town and want to know if this can be a virtual visit?  Please call and let her know.

## 2019-03-09 NOTE — Progress Notes (Signed)
Virtual Visit via Telephone Note   This visit type was conducted due to national recommendations for restrictions regarding the COVID-19 Pandemic (e.g. social distancing) in an effort to limit this patient's exposure and mitigate transmission in our community.  Due to her co-morbid illnesses, this patient is at least at moderate risk for complications without adequate follow up.  This format is felt to be most appropriate for this patient at this time.  The patient did not have access to video technology/had technical difficulties with video requiring transitioning to audio format only (telephone).  All issues noted in this document were discussed and addressed.  No physical exam could be performed with this format.  Please refer to the patient's chart for her  consent to telehealth for [facility].   Date:  03/10/2019   ID:  Olivia Barker, DOB [birth year], MRN [id number]  Patient Location: Home Provider Location: Home  PCP:  Josetta Huddle, MD  Cardiologist:  Dorris Carnes, MD    Evaluation Performed:  Follow-Up Visit  History of Present Illness:    Olivia Barker is a 63 y.o. female with hx of HTN, HL , gastroparesis, FHx of CAD    I saw her back in 2018   Stress test is low risk in 2018  Echo in 2018 normal LV function    She was last seen in clinic in Aug 2020    She had called in feelini jittery    HR increased   Note Wellbutrin was increased   Wt increased   HR in low 100s    Pt noted something not right    Increased stress   B Bhagat started her on metoprolol 25 bid     The patient says she has been feeling some better.  Jitteriness is gone down.  She denies dizziness.  She remains active walking 3 times a week and doing and working out with a trainer on the other 2.  Weight is down.  The patient does not have symptoms concerning for COVID-19 infection (fever, chills, cough, or new shortness of breath).    Past Medical History:  Diagnosis Date  . Bladder pain   . Chronic back  pain    secondary to IC  . Congenital absence of ovary    per pt unknown which side--   . Depression   . GAD (generalized anxiety disorder)   . Gastroparesis   . GERD (gastroesophageal reflux disease)   . History of adenomatous polyp of colon    2010 tubular adenoma and hyperplastic  . History of echocardiogram    Echo 7/18:  EF 55-60, no RWMA  . History of gastric polyp    2014 hyperplastic  . History of gastritis   . History of nuclear stress test    Myoview 7/18: EF 71, no ischemia, low risk  . History of sepsis    urosepsis w/ acute pyelonephritis 09/ 2015  . Hyperlipidemia   . Interstitial cystitis 1992  . Nephrolithiasis    multiple bilateral non-obstructive per abd. xray 11-21-2015  . Sensation of pressure in bladder area   . Tinnitus    Fluid in ears--  continally   Past Surgical History:  Procedure Laterality Date  . ABDOMINAL HYSTERECTOMY  1998  . ABDOMINOPLASTY  06/02/2001  . BREAST REDUCTION SURGERY Bilateral 2008  . CARDIOVASCULAR STRESS TEST  02-25-2012  dr Dorris Carnes   normal lexiscan nuclear study w/ no ischemia/  normal LV function and wall function , ef 79%  .  CARDIOVASCULAR STRESS TEST  02/25/2012   normal nuclear study w/ no ischemia/  normal LV function and wall motion , ef 79%  . CESAREAN SECTION  06-12-1986  . CHOLECYSTECTOMY  apr 2014  . CYSTO WITH HYDRODISTENSION N/A 03/03/2016   Procedure: CYSTOSCOPY/HYDRODISTENSION AND INSTILL MARCAINE AND PYRIDIUM;  Surgeon: Irine Seal, MD;  Location: [facility];  Service: Urology;  Laterality: N/A;  . CYSTO/ HYDRODISTENTION/ INSTILLATION THERAPY  last one early 1990's  . EUS N/A 06/29/2012   Procedure: ESOPHAGEAL ENDOSCOPIC ULTRASOUND (EUS) RADIAL;  Surgeon: Arta Silence, MD;  Location: [facility];  Service: Endoscopy;  Laterality: N/A;  . EUS N/A 10/04/2013   Procedure: ESOPHAGEAL ENDOSCOPIC ULTRASOUND (EUS) RADIAL;  Surgeon: Arta Silence, MD;  Location: [facility];  Service: Endoscopy;   Laterality: N/A;  . EXTRACORPOREAL SHOCK WAVE LITHOTRIPSY Right 11/21/2015  . ORIF RIGHT 5TH METATARSAL FX  02/04/2001     Current Meds  Medication Sig  . ALPRAZolam (XANAX) 1 MG tablet Take 1 mg by mouth as needed for anxiety.   Marland Kitchen buPROPion (WELLBUTRIN XL) 150 MG 24 hr tablet Take 150 mg by mouth daily.  . metoprolol tartrate (LOPRESSOR) 25 MG tablet Take 1 tablet (25 mg total) by mouth 2 (two) times daily.  . ondansetron (ZOFRAN ODT) 4 MG disintegrating tablet Take 1 tablet (4 mg total) by mouth every 8 (eight) hours as needed for nausea.  . phentermine 15 MG capsule Take 1 capsule by mouth daily.  . promethazine (PHENERGAN) 25 MG tablet Take 25 mg by mouth every 6 (six) hours as needed for nausea or vomiting.   . ranitidine (ZANTAC) 150 MG tablet Take 150 mg by mouth as needed for heartburn.  . rosuvastatin (CRESTOR) 20 MG tablet TAKE 1 TABLET ONCE DAILY.  Marland Kitchen triamterene-hydrochlorothiazide (MAXZIDE) 75-50 MG tablet TAKE (1/2) TABLET DAILY.  Marland Kitchen zolpidem (AMBIEN) 10 MG tablet Take 10 mg by mouth at bedtime as needed for sleep.      Allergies:   Aspirin, Iodinated diagnostic agents, Iohexol, Nsaids, Macrobid  [nitrofurantoin macrocrystal], and Other   Social History   Tobacco Use  . Smoking status: Never Smoker  . Smokeless tobacco: Never Used  Substance Use Topics  . Alcohol use: No  . Drug use: No     Family Hx: The patient's family history includes Heart attack in her father; Heart disease in her father; Suicidality in her paternal grandmother.  ROS:   Please see the history of present illness.    All other systems reviewed and are negative.     Labs/Other Tests and Data Reviewed:    EKG:  No ECG reviewed.  Recent Labs: No results found for requested labs within last 8760 hours.   Recent Lipid Panel Lab Results  Component Value Date/Time   CHOL 183 06/17/2015 10:27 AM   CHOL 158 09/18/2013 09:21 AM   TRIG 237 (H) 06/17/2015 10:27 AM   TRIG 214 (H) 09/18/2013  09:21 AM   HDL 51 06/17/2015 10:27 AM   HDL 40 09/18/2013 09:21 AM   CHOLHDL 3.6 06/17/2015 10:27 AM   CHOLHDL 4 09/04/2009 07:58 AM   LDLCALC 85 06/17/2015 10:27 AM   LDLCALC 75 09/18/2013 09:21 AM   LDLDIRECT 214.6 07/02/2009 09:56 AM    Wt Readings from Last 3 Encounters:  03/10/19 168 lb (76.2 kg)  12/19/18 181 lb 12.8 oz (82.5 kg)  02/25/18 173 lb (78.5 kg)     Objective:    Vital Signs:  BP 120/76   Pulse 91  Ht 5\' 2"  (1.575 m)   Wt 168 lb (76.2 kg)   BMI 30.73 kg/m    VITAL SIGNS:  reviewed  ASSESSMENT & PLAN:    1. Elevated heart rate.  Improved she is tolerating beta-blocker.  Continue.  2.  Hypertension blood pressure is okay keep on current regimen.  3 hyperlipidemia continue Crestor.  4 obesity discussed weight loss continued exercise.  COVID-19 Education: The signs and symptoms of COVID-19 were discussed with the patient and how to seek care for testing (follow up with PCP or arrange E-visit).  The importance of social distancing was discussed today.  Time:   Today, I have spent 20 minutes with the patient with telehealth technology discussing the above problems.     Medication Adjustments/Labs and Tests Ordered: Current medicines are reviewed at length with the patient today.  Concerns regarding medicines are outlined above.   Tests Ordered: No orders of the defined types were placed in this encounter.   Medication Changes: No orders of the defined types were placed in this encounter.   Follow Up:  In Person next summer     Signed, Dorris Carnes, MD  03/10/2019 4:09 PM    [place]

## 2019-03-10 ENCOUNTER — Telehealth (INDEPENDENT_AMBULATORY_CARE_PROVIDER_SITE_OTHER): Payer: BC Managed Care – PPO | Admitting: Internal Medicine

## 2019-03-10 ENCOUNTER — Encounter: Payer: Self-pay | Admitting: Internal Medicine

## 2019-03-10 ENCOUNTER — Other Ambulatory Visit: Payer: Self-pay

## 2019-03-10 DIAGNOSIS — Z6833 Body mass index (BMI) 33.0-33.9, adult: Secondary | ICD-10-CM | POA: Diagnosis not present

## 2019-03-10 DIAGNOSIS — I10 Essential (primary) hypertension: Secondary | ICD-10-CM | POA: Diagnosis not present

## 2019-03-10 DIAGNOSIS — R000 Tachycardia, unspecified: Secondary | ICD-10-CM | POA: Diagnosis not present

## 2019-03-10 DIAGNOSIS — E785 Hyperlipidemia, unspecified: Secondary | ICD-10-CM | POA: Diagnosis not present

## 2019-03-10 NOTE — Patient Instructions (Signed)
Medication Instructions:  °No changes °*If you need a refill on your cardiac medications before your next appointment, please call your pharmacy* ° ° °Lab Work: °none °If you have labs (blood work) drawn today and your tests are completely normal, you will receive your results only by: °• MyChart Message (if you have MyChart) OR °• A paper copy in the mail °If you have any lab test that is abnormal or we need to change your treatment, we will call you to review the results. ° ° °Testing/Procedures: °none ° ° °Follow-Up: °At [facility], you and your health needs are our priority.  As part of our continuing mission to provide you with exceptional heart care, we have created designated Provider Care Teams.  These Care Teams include your primary Cardiologist (physician) and Advanced Practice Providers (APPs -  Physician Assistants and Nurse Practitioners) who all work together to provide you with the care you need, when you need it. ° °Your next appointment:   °9 month(s) ° °The format for your next appointment:   °In Person ° °Provider:   °Paula Ross, MD ° ° °Other Instructions ° ° °

## 2019-04-04 DIAGNOSIS — Z20828 Contact with and (suspected) exposure to other viral communicable diseases: Secondary | ICD-10-CM | POA: Diagnosis not present

## 2019-04-15 DIAGNOSIS — F331 Major depressive disorder, recurrent, moderate: Secondary | ICD-10-CM | POA: Diagnosis not present

## 2019-04-15 DIAGNOSIS — F411 Generalized anxiety disorder: Secondary | ICD-10-CM | POA: Diagnosis not present

## 2019-05-18 DIAGNOSIS — F3342 Major depressive disorder, recurrent, in full remission: Secondary | ICD-10-CM | POA: Diagnosis not present

## 2019-05-18 DIAGNOSIS — F4322 Adjustment disorder with anxiety: Secondary | ICD-10-CM | POA: Diagnosis not present

## 2019-05-18 DIAGNOSIS — F410 Panic disorder [episodic paroxysmal anxiety] without agoraphobia: Secondary | ICD-10-CM | POA: Diagnosis not present

## 2019-05-23 DIAGNOSIS — E669 Obesity, unspecified: Secondary | ICD-10-CM | POA: Diagnosis not present

## 2019-05-23 DIAGNOSIS — R7301 Impaired fasting glucose: Secondary | ICD-10-CM | POA: Diagnosis not present

## 2019-05-23 DIAGNOSIS — Z03818 Encounter for observation for suspected exposure to other biological agents ruled out: Secondary | ICD-10-CM | POA: Diagnosis not present

## 2019-05-26 DIAGNOSIS — Z20822 Contact with and (suspected) exposure to covid-19: Secondary | ICD-10-CM | POA: Diagnosis not present

## 2019-05-30 DIAGNOSIS — L74519 Primary focal hyperhidrosis, unspecified: Secondary | ICD-10-CM | POA: Diagnosis not present

## 2019-05-30 DIAGNOSIS — R7301 Impaired fasting glucose: Secondary | ICD-10-CM | POA: Diagnosis not present

## 2019-05-30 DIAGNOSIS — E669 Obesity, unspecified: Secondary | ICD-10-CM | POA: Diagnosis not present

## 2019-05-30 DIAGNOSIS — E7800 Pure hypercholesterolemia, unspecified: Secondary | ICD-10-CM | POA: Diagnosis not present

## 2019-06-05 DIAGNOSIS — R638 Other symptoms and signs concerning food and fluid intake: Secondary | ICD-10-CM | POA: Diagnosis not present

## 2019-06-05 DIAGNOSIS — H9312 Tinnitus, left ear: Secondary | ICD-10-CM | POA: Diagnosis not present

## 2019-06-05 DIAGNOSIS — H903 Sensorineural hearing loss, bilateral: Secondary | ICD-10-CM | POA: Diagnosis not present

## 2019-06-05 DIAGNOSIS — Z713 Dietary counseling and surveillance: Secondary | ICD-10-CM | POA: Diagnosis not present

## 2019-06-05 DIAGNOSIS — Z6831 Body mass index (BMI) 31.0-31.9, adult: Secondary | ICD-10-CM | POA: Diagnosis not present

## 2019-06-18 DIAGNOSIS — Z03818 Encounter for observation for suspected exposure to other biological agents ruled out: Secondary | ICD-10-CM | POA: Diagnosis not present

## 2019-06-18 DIAGNOSIS — Z20822 Contact with and (suspected) exposure to covid-19: Secondary | ICD-10-CM | POA: Diagnosis not present

## 2019-06-20 DIAGNOSIS — R3911 Hesitancy of micturition: Secondary | ICD-10-CM | POA: Diagnosis not present

## 2019-06-20 DIAGNOSIS — N390 Urinary tract infection, site not specified: Secondary | ICD-10-CM | POA: Diagnosis not present

## 2019-06-20 DIAGNOSIS — R8271 Bacteriuria: Secondary | ICD-10-CM | POA: Diagnosis not present

## 2019-06-20 DIAGNOSIS — N3010 Interstitial cystitis (chronic) without hematuria: Secondary | ICD-10-CM | POA: Diagnosis not present

## 2019-06-20 DIAGNOSIS — B961 Klebsiella pneumoniae [K. pneumoniae] as the cause of diseases classified elsewhere: Secondary | ICD-10-CM | POA: Diagnosis not present

## 2019-06-22 DIAGNOSIS — J029 Acute pharyngitis, unspecified: Secondary | ICD-10-CM | POA: Diagnosis not present

## 2019-06-22 DIAGNOSIS — R110 Nausea: Secondary | ICD-10-CM | POA: Diagnosis not present

## 2019-06-23 ENCOUNTER — Ambulatory Visit: Payer: BC Managed Care – PPO | Attending: Internal Medicine

## 2019-06-23 DIAGNOSIS — Z20822 Contact with and (suspected) exposure to covid-19: Secondary | ICD-10-CM

## 2019-06-24 LAB — NOVEL CORONAVIRUS, NAA: SARS-CoV-2, NAA: NOT DETECTED

## 2019-07-10 DIAGNOSIS — J312 Chronic pharyngitis: Secondary | ICD-10-CM | POA: Diagnosis not present

## 2019-07-14 DIAGNOSIS — R590 Localized enlarged lymph nodes: Secondary | ICD-10-CM | POA: Diagnosis not present

## 2019-07-14 DIAGNOSIS — Z87442 Personal history of urinary calculi: Secondary | ICD-10-CM | POA: Diagnosis not present

## 2019-07-14 DIAGNOSIS — N390 Urinary tract infection, site not specified: Secondary | ICD-10-CM | POA: Diagnosis not present

## 2019-07-14 DIAGNOSIS — J029 Acute pharyngitis, unspecified: Secondary | ICD-10-CM | POA: Diagnosis not present

## 2019-07-20 DIAGNOSIS — J312 Chronic pharyngitis: Secondary | ICD-10-CM | POA: Diagnosis not present

## 2019-07-20 DIAGNOSIS — K3189 Other diseases of stomach and duodenum: Secondary | ICD-10-CM | POA: Diagnosis not present

## 2019-07-21 ENCOUNTER — Other Ambulatory Visit: Payer: Self-pay

## 2019-07-21 ENCOUNTER — Ambulatory Visit: Payer: BC Managed Care – PPO | Attending: Internal Medicine

## 2019-07-21 ENCOUNTER — Emergency Department (HOSPITAL_COMMUNITY)
Admission: EM | Admit: 2019-07-21 | Discharge: 2019-07-21 | Disposition: A | Discharge status: Home / Self Care | Payer: BC Managed Care – PPO | Attending: Emergency Medicine | Admitting: Emergency Medicine

## 2019-07-21 ENCOUNTER — Encounter (HOSPITAL_COMMUNITY): Payer: Self-pay | Admitting: Emergency Medicine

## 2019-07-21 DIAGNOSIS — Z743 Need for continuous supervision: Secondary | ICD-10-CM | POA: Diagnosis not present

## 2019-07-21 DIAGNOSIS — R519 Headache, unspecified: Secondary | ICD-10-CM | POA: Insufficient documentation

## 2019-07-21 DIAGNOSIS — L299 Pruritus, unspecified: Secondary | ICD-10-CM | POA: Diagnosis not present

## 2019-07-21 DIAGNOSIS — Z20822 Contact with and (suspected) exposure to covid-19: Secondary | ICD-10-CM | POA: Diagnosis not present

## 2019-07-21 DIAGNOSIS — R030 Elevated blood-pressure reading, without diagnosis of hypertension: Secondary | ICD-10-CM | POA: Diagnosis not present

## 2019-07-21 DIAGNOSIS — Z5321 Procedure and treatment not carried out due to patient leaving prior to being seen by health care provider: Secondary | ICD-10-CM | POA: Diagnosis not present

## 2019-07-21 DIAGNOSIS — N3010 Interstitial cystitis (chronic) without hematuria: Secondary | ICD-10-CM | POA: Diagnosis not present

## 2019-07-21 DIAGNOSIS — N3000 Acute cystitis without hematuria: Secondary | ICD-10-CM | POA: Diagnosis not present

## 2019-07-21 DIAGNOSIS — R200 Anesthesia of skin: Secondary | ICD-10-CM | POA: Insufficient documentation

## 2019-07-21 DIAGNOSIS — R300 Dysuria: Secondary | ICD-10-CM | POA: Diagnosis not present

## 2019-07-21 DIAGNOSIS — B952 Enterococcus as the cause of diseases classified elsewhere: Secondary | ICD-10-CM | POA: Diagnosis not present

## 2019-07-21 DIAGNOSIS — N390 Urinary tract infection, site not specified: Secondary | ICD-10-CM | POA: Diagnosis not present

## 2019-07-21 LAB — CBC WITH DIFFERENTIAL/PLATELET
Abs Immature Granulocytes: 0.02 10*3/uL (ref 0.00–0.07)
Basophils Absolute: 0 10*3/uL (ref 0.0–0.1)
Basophils Relative: 1 %
Eosinophils Absolute: 0.2 10*3/uL (ref 0.0–0.5)
Eosinophils Relative: 3 %
HCT: 42.9 % (ref 36.0–46.0)
Hemoglobin: 13.7 g/dL (ref 12.0–15.0)
Immature Granulocytes: 0 %
Lymphocytes Relative: 36 %
Lymphs Abs: 2.8 10*3/uL (ref 0.7–4.0)
MCH: 30.2 pg (ref 26.0–34.0)
MCHC: 31.9 g/dL (ref 30.0–36.0)
MCV: 94.5 fL (ref 80.0–100.0)
Monocytes Absolute: 0.6 10*3/uL (ref 0.1–1.0)
Monocytes Relative: 8 %
Neutro Abs: 3.9 10*3/uL (ref 1.7–7.7)
Neutrophils Relative %: 52 %
Platelets: 280 10*3/uL (ref 150–400)
RBC: 4.54 MIL/uL (ref 3.87–5.11)
RDW: 11.9 % (ref 11.5–15.5)
WBC: 7.6 10*3/uL (ref 4.0–10.5)
nRBC: 0 % (ref 0.0–0.2)

## 2019-07-21 LAB — COMPREHENSIVE METABOLIC PANEL
ALT: 54 U/L — ABNORMAL HIGH (ref 0–44)
AST: 29 U/L (ref 15–41)
Albumin: 3.8 g/dL (ref 3.5–5.0)
Alkaline Phosphatase: 67 U/L (ref 38–126)
Anion gap: 10 (ref 5–15)
BUN: 16 mg/dL (ref 8–23)
CO2: 26 mmol/L (ref 22–32)
Calcium: 9.5 mg/dL (ref 8.9–10.3)
Chloride: 105 mmol/L (ref 98–111)
Creatinine, Ser: 0.92 mg/dL (ref 0.44–1.00)
GFR calc Af Amer: >60 mL/min (ref >60)
GFR calc non Af Amer: >60 mL/min (ref >60)
Glucose, Bld: 83 mg/dL (ref 70–99)
Potassium: 4.2 mmol/L (ref 3.5–5.1)
Sodium: 141 mmol/L (ref 135–145)
Total Bilirubin: 0.5 mg/dL (ref 0.3–1.2)
Total Protein: 6.8 g/dL (ref 6.5–8.1)

## 2019-07-21 LAB — URINALYSIS, ROUTINE W REFLEX MICROSCOPIC

## 2019-07-21 LAB — URINALYSIS, MICROSCOPIC (REFLEX)

## 2019-07-21 MED ORDER — DIPHENHYDRAMINE HCL 25 MG PO CAPS
25.0000 mg | ORAL_CAPSULE | Freq: Once | ORAL | Status: AC
  Administered 2019-07-21: 22:00:00 25 mg via ORAL
  Filled 2019-07-21: qty 1

## 2019-07-21 NOTE — ED Notes (Signed)
Patient states she was feeling better after being given benadryl and only had a slight headache.  She states that she may go home.  She is no longer in the waiting room.

## 2019-07-21 NOTE — ED Notes (Signed)
Pt requesting benadryl for itching. Pt states she took the covid vaccine two hours ago and thinks she is having a reaction.

## 2019-07-21 NOTE — ED Triage Notes (Signed)
Patient arrived with EMS reports headache , scalp itching and brief left facial tingling/numbness this evening , currently taking oral antibiotic for UTI . Denies fever or chills . Alert and oriented , speech clear with no facial asymmetry , no arm drift /ambulatory . Denies fever or chills .

## 2019-07-22 LAB — NOVEL CORONAVIRUS, NAA: SARS-CoV-2, NAA: NOT DETECTED

## 2019-07-25 DIAGNOSIS — Z1231 Encounter for screening mammogram for malignant neoplasm of breast: Secondary | ICD-10-CM | POA: Diagnosis not present

## 2019-07-28 DIAGNOSIS — R7303 Prediabetes: Secondary | ICD-10-CM | POA: Diagnosis not present

## 2019-07-28 DIAGNOSIS — R635 Abnormal weight gain: Secondary | ICD-10-CM | POA: Diagnosis not present

## 2019-07-28 DIAGNOSIS — Z1159 Encounter for screening for other viral diseases: Secondary | ICD-10-CM | POA: Diagnosis not present

## 2019-07-28 DIAGNOSIS — E781 Pure hyperglyceridemia: Secondary | ICD-10-CM | POA: Diagnosis not present

## 2019-07-28 DIAGNOSIS — N951 Menopausal and female climacteric states: Secondary | ICD-10-CM | POA: Diagnosis not present

## 2019-08-01 DIAGNOSIS — Z9049 Acquired absence of other specified parts of digestive tract: Secondary | ICD-10-CM | POA: Diagnosis not present

## 2019-08-01 DIAGNOSIS — K3189 Other diseases of stomach and duodenum: Secondary | ICD-10-CM | POA: Diagnosis not present

## 2019-08-04 DIAGNOSIS — R3911 Hesitancy of micturition: Secondary | ICD-10-CM | POA: Diagnosis not present

## 2019-08-04 DIAGNOSIS — N3010 Interstitial cystitis (chronic) without hematuria: Secondary | ICD-10-CM | POA: Diagnosis not present

## 2019-08-10 DIAGNOSIS — F332 Major depressive disorder, recurrent severe without psychotic features: Secondary | ICD-10-CM | POA: Diagnosis not present

## 2019-08-14 ENCOUNTER — Other Ambulatory Visit: Payer: Self-pay | Admitting: Internal Medicine

## 2019-08-16 DIAGNOSIS — R232 Flushing: Secondary | ICD-10-CM | POA: Diagnosis not present

## 2019-08-16 DIAGNOSIS — Z1339 Encounter for screening examination for other mental health and behavioral disorders: Secondary | ICD-10-CM | POA: Diagnosis not present

## 2019-08-16 DIAGNOSIS — E781 Pure hyperglyceridemia: Secondary | ICD-10-CM | POA: Diagnosis not present

## 2019-08-16 DIAGNOSIS — Z1331 Encounter for screening for depression: Secondary | ICD-10-CM | POA: Diagnosis not present

## 2019-08-16 DIAGNOSIS — G479 Sleep disorder, unspecified: Secondary | ICD-10-CM | POA: Diagnosis not present

## 2019-08-31 DIAGNOSIS — F341 Dysthymic disorder: Secondary | ICD-10-CM | POA: Diagnosis not present

## 2019-08-31 DIAGNOSIS — F4321 Adjustment disorder with depressed mood: Secondary | ICD-10-CM | POA: Diagnosis not present

## 2019-09-01 DIAGNOSIS — R109 Unspecified abdominal pain: Secondary | ICD-10-CM | POA: Diagnosis not present

## 2019-09-01 DIAGNOSIS — R0602 Shortness of breath: Secondary | ICD-10-CM | POA: Diagnosis not present

## 2019-09-01 DIAGNOSIS — E7800 Pure hypercholesterolemia, unspecified: Secondary | ICD-10-CM | POA: Diagnosis not present

## 2019-09-01 DIAGNOSIS — G8918 Other acute postprocedural pain: Secondary | ICD-10-CM | POA: Diagnosis not present

## 2019-09-01 DIAGNOSIS — Z9049 Acquired absence of other specified parts of digestive tract: Secondary | ICD-10-CM | POA: Diagnosis not present

## 2019-09-01 DIAGNOSIS — J986 Disorders of diaphragm: Secondary | ICD-10-CM | POA: Diagnosis not present

## 2019-09-01 DIAGNOSIS — Z91041 Radiographic dye allergy status: Secondary | ICD-10-CM | POA: Diagnosis not present

## 2019-09-01 DIAGNOSIS — Z886 Allergy status to analgesic agent status: Secondary | ICD-10-CM | POA: Diagnosis not present

## 2019-09-01 DIAGNOSIS — K3184 Gastroparesis: Secondary | ICD-10-CM | POA: Diagnosis not present

## 2019-09-01 DIAGNOSIS — Z79899 Other long term (current) drug therapy: Secondary | ICD-10-CM | POA: Diagnosis not present

## 2019-09-01 DIAGNOSIS — R112 Nausea with vomiting, unspecified: Secondary | ICD-10-CM | POA: Diagnosis not present

## 2019-09-01 DIAGNOSIS — H9190 Unspecified hearing loss, unspecified ear: Secondary | ICD-10-CM | POA: Diagnosis not present

## 2019-09-01 DIAGNOSIS — K760 Fatty (change of) liver, not elsewhere classified: Secondary | ICD-10-CM | POA: Diagnosis not present

## 2019-09-28 DIAGNOSIS — E8881 Metabolic syndrome: Secondary | ICD-10-CM | POA: Diagnosis not present

## 2019-09-28 DIAGNOSIS — R635 Abnormal weight gain: Secondary | ICD-10-CM | POA: Diagnosis not present

## 2019-09-28 DIAGNOSIS — Z6831 Body mass index (BMI) 31.0-31.9, adult: Secondary | ICD-10-CM | POA: Diagnosis not present

## 2019-10-18 DIAGNOSIS — K3184 Gastroparesis: Secondary | ICD-10-CM | POA: Diagnosis not present

## 2019-10-18 DIAGNOSIS — E781 Pure hyperglyceridemia: Secondary | ICD-10-CM | POA: Diagnosis not present

## 2019-10-18 DIAGNOSIS — E8881 Metabolic syndrome: Secondary | ICD-10-CM | POA: Diagnosis not present

## 2019-10-18 DIAGNOSIS — Z6831 Body mass index (BMI) 31.0-31.9, adult: Secondary | ICD-10-CM | POA: Diagnosis not present

## 2019-11-06 DIAGNOSIS — Z6831 Body mass index (BMI) 31.0-31.9, adult: Secondary | ICD-10-CM | POA: Diagnosis not present

## 2019-11-06 DIAGNOSIS — K3184 Gastroparesis: Secondary | ICD-10-CM | POA: Diagnosis not present

## 2019-11-06 DIAGNOSIS — R7303 Prediabetes: Secondary | ICD-10-CM | POA: Diagnosis not present

## 2019-11-22 DIAGNOSIS — F411 Generalized anxiety disorder: Secondary | ICD-10-CM | POA: Diagnosis not present

## 2019-11-22 DIAGNOSIS — F4321 Adjustment disorder with depressed mood: Secondary | ICD-10-CM | POA: Diagnosis not present

## 2019-11-23 DIAGNOSIS — R635 Abnormal weight gain: Secondary | ICD-10-CM | POA: Diagnosis not present

## 2019-11-23 DIAGNOSIS — N6310 Unspecified lump in the right breast, unspecified quadrant: Secondary | ICD-10-CM | POA: Diagnosis not present

## 2019-11-23 DIAGNOSIS — R7303 Prediabetes: Secondary | ICD-10-CM | POA: Diagnosis not present

## 2019-11-23 DIAGNOSIS — R309 Painful micturition, unspecified: Secondary | ICD-10-CM | POA: Diagnosis not present

## 2019-11-23 DIAGNOSIS — R928 Other abnormal and inconclusive findings on diagnostic imaging of breast: Secondary | ICD-10-CM | POA: Diagnosis not present

## 2019-11-23 DIAGNOSIS — N641 Fat necrosis of breast: Secondary | ICD-10-CM | POA: Diagnosis not present

## 2019-12-08 DIAGNOSIS — Z1152 Encounter for screening for COVID-19: Secondary | ICD-10-CM | POA: Diagnosis not present

## 2019-12-08 DIAGNOSIS — H6983 Other specified disorders of Eustachian tube, bilateral: Secondary | ICD-10-CM | POA: Diagnosis not present

## 2019-12-08 DIAGNOSIS — R070 Pain in throat: Secondary | ICD-10-CM | POA: Diagnosis not present

## 2020-01-03 DIAGNOSIS — F329 Major depressive disorder, single episode, unspecified: Secondary | ICD-10-CM | POA: Diagnosis not present

## 2020-01-05 DIAGNOSIS — R1110 Vomiting, unspecified: Secondary | ICD-10-CM | POA: Diagnosis not present

## 2020-01-05 DIAGNOSIS — G44201 Tension-type headache, unspecified, intractable: Secondary | ICD-10-CM | POA: Diagnosis not present

## 2020-01-05 DIAGNOSIS — R110 Nausea: Secondary | ICD-10-CM | POA: Diagnosis not present

## 2020-01-05 DIAGNOSIS — Z03818 Encounter for observation for suspected exposure to other biological agents ruled out: Secondary | ICD-10-CM | POA: Diagnosis not present

## 2020-01-10 DIAGNOSIS — Z79899 Other long term (current) drug therapy: Secondary | ICD-10-CM | POA: Diagnosis not present

## 2020-01-10 DIAGNOSIS — R140 Abdominal distension (gaseous): Secondary | ICD-10-CM | POA: Diagnosis not present

## 2020-01-10 DIAGNOSIS — R112 Nausea with vomiting, unspecified: Secondary | ICD-10-CM | POA: Diagnosis not present

## 2020-01-10 DIAGNOSIS — K219 Gastro-esophageal reflux disease without esophagitis: Secondary | ICD-10-CM | POA: Diagnosis not present

## 2020-01-10 DIAGNOSIS — K3184 Gastroparesis: Secondary | ICD-10-CM | POA: Diagnosis not present

## 2020-01-10 DIAGNOSIS — F419 Anxiety disorder, unspecified: Secondary | ICD-10-CM | POA: Diagnosis not present

## 2020-01-10 DIAGNOSIS — I10 Essential (primary) hypertension: Secondary | ICD-10-CM | POA: Diagnosis not present

## 2020-01-16 DIAGNOSIS — F329 Major depressive disorder, single episode, unspecified: Secondary | ICD-10-CM | POA: Diagnosis not present

## 2020-01-19 DIAGNOSIS — Z79899 Other long term (current) drug therapy: Secondary | ICD-10-CM | POA: Diagnosis not present

## 2020-02-05 DIAGNOSIS — Z79899 Other long term (current) drug therapy: Secondary | ICD-10-CM | POA: Diagnosis not present

## 2020-02-05 DIAGNOSIS — R739 Hyperglycemia, unspecified: Secondary | ICD-10-CM | POA: Diagnosis not present

## 2020-02-05 DIAGNOSIS — Z13220 Encounter for screening for lipoid disorders: Secondary | ICD-10-CM | POA: Diagnosis not present

## 2020-02-05 DIAGNOSIS — R197 Diarrhea, unspecified: Secondary | ICD-10-CM | POA: Diagnosis not present

## 2020-02-05 DIAGNOSIS — Z23 Encounter for immunization: Secondary | ICD-10-CM | POA: Diagnosis not present

## 2020-02-05 DIAGNOSIS — K3184 Gastroparesis: Secondary | ICD-10-CM | POA: Diagnosis not present

## 2020-02-05 DIAGNOSIS — Z0000 Encounter for general adult medical examination without abnormal findings: Secondary | ICD-10-CM | POA: Diagnosis not present

## 2020-02-05 DIAGNOSIS — J309 Allergic rhinitis, unspecified: Secondary | ICD-10-CM | POA: Diagnosis not present

## 2020-02-14 ENCOUNTER — Institutional Professional Consult (permissible substitution): Payer: BC Managed Care – PPO | Admitting: Plastic Surgery

## 2020-02-29 ENCOUNTER — Institutional Professional Consult (permissible substitution): Payer: BC Managed Care – PPO | Admitting: Plastic Surgery

## 2020-03-21 ENCOUNTER — Other Ambulatory Visit: Payer: Self-pay

## 2020-03-21 ENCOUNTER — Ambulatory Visit: Payer: BC Managed Care – PPO | Admitting: Internal Medicine

## 2020-03-21 ENCOUNTER — Encounter: Payer: Self-pay | Admitting: Internal Medicine

## 2020-03-21 VITALS — BP 135/84 | HR 105 | Ht 62.0 in | Wt 172.2 lb

## 2020-03-21 DIAGNOSIS — E782 Mixed hyperlipidemia: Secondary | ICD-10-CM

## 2020-03-21 DIAGNOSIS — I10 Essential (primary) hypertension: Secondary | ICD-10-CM | POA: Diagnosis not present

## 2020-03-21 NOTE — Progress Notes (Signed)
Cardiology Office Note   Date:  03/21/2020   ID:  Olivia Barker, DOB [birth year], MRN [id number]  PCP:  Josetta Huddle, MD  Cardiologist:   Dorris Carnes, MD    Follow-up of hypertension   History of Present Illness: Olivia Barker is a 64 y.o. female with a history of hypertension, hyperlipidemia, family history of CAD, gastroparesis.  Stress test was low risk in 2018.  Echo in 2018 showed normal LV function.  I last saw the patient is a televisit.  Since seen she has done okay.  She denies chest pain.  No dizziness.  Breathing is okay.  Still having problems with eating and the gastroparesis she has an appointment next week at [facility].  She also has an appointment at [facility] for behavioral health.  She denies jitteriness like she had in the past       Current Meds  Medication Sig  . ALPRAZolam (XANAX) 1 MG tablet Take 1 mg by mouth as needed for anxiety.   Marland Kitchen buPROPion (WELLBUTRIN XL) 150 MG 24 hr tablet Take 150 mg by mouth daily.  . butalbital-acetaminophen-caffeine (FIORICET) 50-325-40 MG tablet Take 1 tablet by mouth as needed. MIGRAINE  . clidinium-chlordiazePOXIDE (LIBRAX) 5-2.5 MG capsule Take 1 capsule by mouth as needed. For stomach  . escitalopram (LEXAPRO) 20 MG tablet Take 20 mg by mouth daily.  . metFORMIN (GLUCOPHAGE-XR) 500 MG 24 hr tablet Take 2 tablets by mouth 2 (two) times daily.  . Methen-Hyosc-Meth Blue-Na Phos (UROGESIC-BLUE) 81.6 MG TABS Take 1 capsule by mouth as needed. Bladder spasms  . methocarbamol (ROBAXIN) 500 MG tablet Take 500 mg by mouth every 8 (eight) hours. spasms  . metoprolol tartrate (LOPRESSOR) 25 MG tablet Take 1 tablet (25 mg total) by mouth 2 (two) times daily.  . phentermine 15 MG capsule Take 1 capsule by mouth daily.  . promethazine (PHENERGAN) 25 MG tablet Take 25 mg by mouth every 6 (six) hours as needed for nausea or vomiting.   . promethazine (PHENERGAN) 25 MG/ML injection Inject 25 mg into the muscle as needed. nausea  .  ranitidine (ZANTAC) 150 MG tablet Take 150 mg by mouth as needed for heartburn.  . rosuvastatin (CRESTOR) 20 MG tablet TAKE 1 TABLET ONCE DAILY.  Marland Kitchen Semaglutide,0.25 or 0.5MG /DOS, (OZEMPIC, 0.25 OR 0.5 MG/DOSE,) 2 MG/1.5ML SOPN Inject 0.25 mg into the skin as directed.  . tamsulosin (FLOMAX) 0.4 MG CAPS capsule Take 0.4 mg by mouth as needed. urinary  . zolpidem (AMBIEN) 10 MG tablet Take 10 mg by mouth at bedtime as needed for sleep.      Allergies:   Aspirin, Iodinated diagnostic agents, Iohexol, Nsaids, Macrobid  [nitrofurantoin macrocrystal], and Other   Past Medical History:  Diagnosis Date  . Bladder pain   . Chronic back pain    secondary to IC  . Congenital absence of ovary    per pt unknown which side--   . Depression   . GAD (generalized anxiety disorder)   . Gastroparesis   . GERD (gastroesophageal reflux disease)   . History of adenomatous polyp of colon    2010 tubular adenoma and hyperplastic  . History of echocardiogram    Echo 7/18:  EF 55-60, no RWMA  . History of gastric polyp    2014 hyperplastic  . History of gastritis   . History of nuclear stress test    Myoview 7/18: EF 71, no ischemia, low risk  . History of sepsis    urosepsis w/  acute pyelonephritis 09/ 2015  . Hyperlipidemia   . Interstitial cystitis 1992  . Nephrolithiasis    multiple bilateral non-obstructive per abd. xray 11-21-2015  . Sensation of pressure in bladder area   . Tinnitus    Fluid in ears--  continally    Past Surgical History:  Procedure Laterality Date  . ABDOMINAL HYSTERECTOMY  1998  . ABDOMINOPLASTY  06/02/2001  . BREAST REDUCTION SURGERY Bilateral 2008  . CARDIOVASCULAR STRESS TEST  02-25-2012  dr Dorris Carnes   normal lexiscan nuclear study w/ no ischemia/  normal LV function and wall function , ef 79%  . CARDIOVASCULAR STRESS TEST  02/25/2012   normal nuclear study w/ no ischemia/  normal LV function and wall motion , ef 79%  . CESAREAN SECTION  06-12-1986  .  CHOLECYSTECTOMY  apr 2014  . CYSTO WITH HYDRODISTENSION N/A 03/03/2016   Procedure: CYSTOSCOPY/HYDRODISTENSION AND INSTILL MARCAINE AND PYRIDIUM;  Surgeon: Irine Seal, MD;  Location: [facility];  Service: Urology;  Laterality: N/A;  . CYSTO/ HYDRODISTENTION/ INSTILLATION THERAPY  last one early 1990's  . EUS N/A 06/29/2012   Procedure: ESOPHAGEAL ENDOSCOPIC ULTRASOUND (EUS) RADIAL;  Surgeon: Arta Silence, MD;  Location: [facility];  Service: Endoscopy;  Laterality: N/A;  . EUS N/A 10/04/2013   Procedure: ESOPHAGEAL ENDOSCOPIC ULTRASOUND (EUS) RADIAL;  Surgeon: Arta Silence, MD;  Location: [facility];  Service: Endoscopy;  Laterality: N/A;  . EXTRACORPOREAL SHOCK WAVE LITHOTRIPSY Right 11/21/2015  . ORIF RIGHT 5TH METATARSAL FX  02/04/2001     Social History:  The patient  reports that she has never smoked. She has never used smokeless tobacco. She reports that she does not drink alcohol and does not use drugs.   Family History:  The patient's family history includes Heart attack in her father; Heart disease in her father; Suicidality in her paternal grandmother.    ROS:  Please see the history of present illness. All other systems are reviewed and  Negative to the above problem except as noted.    PHYSICAL EXAM: VS:  BP 135/84   Pulse (!) 105   Ht 5\' 2"  (1.575 m)   Wt 172 lb 3.2 oz (78.1 kg)   SpO2 97%   BMI 31.50 kg/m   BP laying 132/84  P 94  Sitting  132/87  P 100   Standing 121/81  P 103  Standing 4 min 117/83  P 104   GEN: Obese 64 yo, in no acute distress  HEENT: normal  Neck: no JVD, carotid bruits Cardiac: RRR; no murmurs,no edema  Respiratory:  clear to auscultation bilaterally,  GI: soft, nontender, nondistended, + BS  No hepatomegaly  MS: no deformity Moving all extremities   Skin: warm and dry, no rash Neuro:  Strength and sensation are intact Psych: euthymic mood, full affect   EKG:  EKG is not  ordered today.   Lipid Panel      Component Value Date/Time   CHOL 183 06/17/2015 1027   CHOL 158 09/18/2013 0921   TRIG 237 (H) 06/17/2015 1027   TRIG 214 (H) 09/18/2013 0921   HDL 51 06/17/2015 1027   HDL 40 09/18/2013 0921   CHOLHDL 3.6 06/17/2015 1027   CHOLHDL 4 09/04/2009 0758   VLDL 38.6 09/04/2009 0758   LDLCALC 85 06/17/2015 1027   LDLCALC 75 09/18/2013 0921   LDLDIRECT 214.6 07/02/2009 0956      Wt Readings from Last 3 Encounters:  03/21/20 172 lb 3.2 oz (78.1 kg)  07/21/19  176 lb 5.9 oz (80 kg)  03/10/19 168 lb (76.2 kg)      ASSESSMENT AND PLAN:  1  HTN  BP is OK   She drops a little wit hstanding but not diagnositc  2  Elevated HR   This may all be related to autonomic issues   HR was higher on arrival  She does not meet criteria for POTS>   Follow  Keep on Toprol X  3  HL  Contine Crestor   Lipids are Very good   5  Obesity  She has a difficult time with eating  With gastroparesis   Stay active    F/U 9-10 mon   Current medicines are reviewed at length with the patient today.  The patient does not have concerns regarding medicines.  Signed, Dorris Carnes, MD  03/21/2020 4:29 PM    [address], [place], [place]  [address] Phone: [phone]; Fax: [phone]

## 2020-03-21 NOTE — Patient Instructions (Signed)
Medication Instructions:  No changes  *If you need a refill on your cardiac medications before your next appointment, please call your pharmacy*   Lab Work: none If you have labs (blood work) drawn today and your tests are completely normal, you will receive your results only by: Marland Kitchen MyChart Message (if you have MyChart) OR . A paper copy in the mail If you have any lab test that is abnormal or we need to change your treatment, we will call you to review the results.   Testing/Procedures: none   Follow-Up: Your physician recommends that you schedule a follow-up appointment in: 9 months with Dr. Harrington Challenger.    Other Instructions

## 2020-03-25 DIAGNOSIS — A049 Bacterial intestinal infection, unspecified: Secondary | ICD-10-CM | POA: Diagnosis not present

## 2020-03-25 DIAGNOSIS — K6389 Other specified diseases of intestine: Secondary | ICD-10-CM | POA: Diagnosis not present

## 2020-03-26 ENCOUNTER — Encounter: Payer: Self-pay | Admitting: Physician Assistant

## 2020-03-26 ENCOUNTER — Other Ambulatory Visit: Payer: Self-pay

## 2020-03-26 ENCOUNTER — Ambulatory Visit (INDEPENDENT_AMBULATORY_CARE_PROVIDER_SITE_OTHER): Payer: BC Managed Care – PPO | Admitting: Physician Assistant

## 2020-03-26 VITALS — BP 143/88 | HR 112 | Wt 170.0 lb

## 2020-03-26 DIAGNOSIS — Z635 Disruption of family by separation and divorce: Secondary | ICD-10-CM | POA: Diagnosis not present

## 2020-03-26 DIAGNOSIS — F5105 Insomnia due to other mental disorder: Secondary | ICD-10-CM

## 2020-03-26 DIAGNOSIS — F99 Mental disorder, not otherwise specified: Secondary | ICD-10-CM

## 2020-03-26 DIAGNOSIS — F329 Major depressive disorder, single episode, unspecified: Secondary | ICD-10-CM | POA: Diagnosis not present

## 2020-03-26 DIAGNOSIS — F411 Generalized anxiety disorder: Secondary | ICD-10-CM

## 2020-03-26 MED ORDER — BUPROPION HCL ER (XL) 300 MG PO TB24: 300.0000 mg | ORAL_TABLET | Freq: Every day | ORAL | 1 refills | Status: DC
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
## 2020-04-15 ENCOUNTER — Other Ambulatory Visit: Payer: Self-pay | Admitting: Physician Assistant

## 2020-04-15 ENCOUNTER — Telehealth: Payer: Self-pay | Admitting: Physician Assistant

## 2020-04-15 ENCOUNTER — Other Ambulatory Visit: Payer: Self-pay | Admitting: Internal Medicine

## 2020-04-15 MED ORDER — BUPROPION HCL ER (XL) 300 MG PO TB24: 300.0000 mg | ORAL_TABLET | Freq: Every day | ORAL | 1 refills | Status: DC

## 2020-04-15 NOTE — Telephone Encounter (Signed)
I have only seen her one time and do not know her, so I will not prescribe controlled substances in this situation.  She needs to contact her previous provider who prescribed those drugs.  I will be glad to send in Wellbutrin since I gave her the last prescription.

## 2020-04-15 NOTE — Telephone Encounter (Signed)
Also received a request from [facility]

## 2020-04-15 NOTE — Telephone Encounter (Signed)
Pt lost all medication on airplane over the weekend. Pt said that it was discussed in her last visit that all her meds will be handled here. Pt wants to know if she can get a rx for Wellbutrin, Ambien, xanax, lexapro. Please send in to [facility]. If any questions please call.

## 2020-04-15 NOTE — Telephone Encounter (Signed)
Rtc to patient and discussed information. She was in the understanding that Helene Kelp was taking over her medication from Dr. Toy Care. She is due for a refill on Alprazolam 1 mg #120 anyway her last fill was 03/13/20. Her last refill on Ambien was 04/09/20 and I explained to her that could not be filled at this time. Asking also for her Wellbutrin XL 300 mg and Lexapro 20 mg.   Informed patient I would discuss with Helene Kelp

## 2020-04-16 ENCOUNTER — Other Ambulatory Visit: Payer: Self-pay | Admitting: Physician Assistant

## 2020-04-16 MED ORDER — BUPROPION HCL ER (XL) 300 MG PO TB24: 300.0000 mg | ORAL_TABLET | Freq: Every day | ORAL | 1 refills | Status: DC

## 2020-04-16 MED ORDER — ESCITALOPRAM OXALATE 20 MG PO TABS: 20.0000 mg | ORAL_TABLET | Freq: Every day | ORAL | 1 refills | Status: DC

## 2020-04-16 MED ORDER — ALPRAZOLAM 1 MG PO TABS: 1.0000 mg | ORAL_TABLET | Freq: Three times a day (TID) | ORAL | 1 refills | Status: DC | PRN

## 2020-04-16 NOTE — Telephone Encounter (Signed)
Thanks for clarifying the Xanax fill date.  I have sent 105 pills in.  It looks like she has been getting 120 pills from Dr. Toy Care but patient told me she is only taking 3 a day.  So I am giving her enough pills to take 3-1/2 pills/day if needed.  She is on a very high dose of Xanax and I am going to try to decrease the dose over time anyway and treat the anxiety and some other manner.  Not saying she will have to go off it completely.I sent in the Lexapro and Wellbutrin.

## 2020-04-17 NOTE — Telephone Encounter (Signed)
Pt called again checking status on Ambien Rx. [phone]. Contact # apt 1/5

## 2020-04-17 NOTE — Telephone Encounter (Signed)
Yes, that is right. No Ambien because she just had it filled on 04/09/2020. I understand that she said she lost it on the plane but there is nothing that I can do about it.

## 2020-04-17 NOTE — Telephone Encounter (Signed)
I informed the patient when we received the request that the other medication would be sent but not any of the Ambien. Is that correct?

## 2020-04-19 ENCOUNTER — Other Ambulatory Visit: Payer: Self-pay

## 2020-04-19 ENCOUNTER — Ambulatory Visit (INDEPENDENT_AMBULATORY_CARE_PROVIDER_SITE_OTHER): Payer: BC Managed Care – PPO | Admitting: Psychiatry

## 2020-04-19 DIAGNOSIS — F329 Major depressive disorder, single episode, unspecified: Secondary | ICD-10-CM | POA: Diagnosis not present
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
## 2020-04-25 ENCOUNTER — Ambulatory Visit: Payer: BC Managed Care – PPO | Admitting: Psychiatry

## 2020-04-26 ENCOUNTER — Other Ambulatory Visit: Payer: Self-pay | Admitting: Physician Assistant

## 2020-04-26 ENCOUNTER — Telehealth: Payer: Self-pay | Admitting: Physician Assistant

## 2020-04-26 MED ORDER — ESCITALOPRAM OXALATE 20 MG PO TABS: 30.0000 mg | ORAL_TABLET | Freq: Every day | ORAL | 1 refills | Status: DC

## 2020-04-26 NOTE — Telephone Encounter (Signed)
Pt called and said that she is going through a deep depression right now and feels like her medicine needs to be tweaked. Please give her a call at [phone]

## 2020-04-26 NOTE — Telephone Encounter (Signed)
I returned patient's call.  She is much more anxious, has had a panic attack all day today.  She has taken 1 Xanax which has helped but she is hesitant to take more.  We increased the Wellbutrin a month ago but that has not been helpful with the depression.  She feels really down and lonely.  She is not sleeping well.  Does not have a lot of energy or motivation.  Denies suicidal or homicidal thoughts.  Recommend she take a Xanax now.  I encouraged her to take when needed as directed.  I respect her for not taking the Xanax frequently but she needs it now.  I increased Lexapro to 30 mg p.o. daily.  She has a follow-up appointment with me in a few weeks.  She knows to go to the emergency room or [facility] if she gets worse.

## 2020-04-26 NOTE — Telephone Encounter (Signed)
Olivia Barker called again concerned that she won't get a call.  She feels she is getting into a crisis depression and needs to speak with someone.  I did advise her that if she was in crisis mode and couldn't wait for a call from the dr. To go to the ER or to the [facility].

## 2020-04-29 DIAGNOSIS — K760 Fatty (change of) liver, not elsewhere classified: Secondary | ICD-10-CM | POA: Diagnosis not present

## 2020-04-29 DIAGNOSIS — N200 Calculus of kidney: Secondary | ICD-10-CM | POA: Diagnosis not present

## 2020-05-01 ENCOUNTER — Other Ambulatory Visit: Payer: Self-pay

## 2020-05-01 ENCOUNTER — Ambulatory Visit (INDEPENDENT_AMBULATORY_CARE_PROVIDER_SITE_OTHER): Payer: BC Managed Care – PPO | Admitting: Psychiatry

## 2020-05-01 DIAGNOSIS — F329 Major depressive disorder, single episode, unspecified: Secondary | ICD-10-CM | POA: Diagnosis not present

## 2020-05-01 NOTE — Progress Notes (Signed)
[facility] Counselor/Therapist Progress Note  Patient ID: Olivia Barker, MRN: [id number],    Date: 05/01/2020  Time Spent: 60 minutes   1:00pm to 2:00pm  Treatment Type: Individual Therapy  Reported Symptoms: anxiety, "sometimes panic", sadness/hurt from divorce, "abandonment issues"  Mental Status Exam:  Appearance:   Well Groomed     Behavior:  Appropriate, Sharing and Motivated  Motor:  Normal  Speech/Language:   Clear and Coherent  Affect:  anxious, some depression  Mood:  anxious and sad  Thought process:  goal directed  Thought content:    WNL  Sensory/Perceptual disturbances:    WNL  Orientation:  oriented to person, place, time/date, situation, day of week, month of year and year  Attention:  Good  Concentration:  Fair  Memory:  WNL  Fund of knowledge:   Good  Insight:    Good and Fair  Judgment:   Good and Fair  Impulse Control:  Good and Fair   Risk Assessment: Danger to Self:  No Self-injurious Behavior: No Danger to Others: No Duty to Warn:no Physical Aggression / Violence:No  Access to Firearms a concern: No  Gang Involvement:No   Subjective: Patient today reporting anxiety, abandonment issues, depression, sadness hurt from divorce.    Interventions: Solution-Oriented/Positive Psychology  Diagnosis:   ICD-10-CM   1. Reactive depression  F32.9     Plan of Care:  Patient not signing treatment plan on computer screen due to [place].  Treatment goals: Goals remain on treatment plan as patient works with strategies to achieve her goals.  Progress will be noted each session and documented in the "progress" section of note.  Long term goal: Develop healthy cognitive patterns and beliefs about self and  the world that lead to alleviation and help prevent the relapse of depression/anxiety symptoms. "I want to not constantly think about the life I had versus the life I have now. I know I should be grateful and that be enough but the gratefulness has  not made me happy.  I feel like I just stay sad. Want to be less anxious.( Does have a lot of connections here after being here for 40 years. )  Short-term goal: Verbally expressed understanding of the relationship between depressed/anxious mood and repression of feelings such as hurt anger and sadness.  Strategies: 1. Identify cognitive self talk that leads to depression/anxiety and began to make self talk more positive and affirming. 2.  Assist in developing awareness of cognitive messages that reinforce hopelessness and helplessness.  Work towards forming healthier cognitive messages to help patient move forward.  Progress: Patient today reporting anxiety, some panic, hurt from divorce, "abandonment issues." Discussing how she's been stuck hanging on to past. Concerned about having and making new friends after "losing" some in midst of divorce.  Patient did better today and stay on task and really dealing with some difficult issues in her life, mainly surrounding her divorce 6 years ago and the fact she felt very betrayed by her husband and a person who had been a friend of hers.  Has hung on to the hurt and trauma of all that happened and has not been able to move forward with her life.  We talked about that in more detail today and some steps that she can take to start the process of truly healing and moving forward.  She is to do some writing about this between sessions and also take some action in initiating contact with people that she has  met more recently through a women's group.  Continues to be involved as a new member of [employer], [employer], and has taken up golfing and efforts to meet new people and to have more of a variety of activities in her life.  She does struggle struggle with getting fixated on certain events in the past but expresses more of a willingness today to actually work with therapist and create some change.  Goal review and progress/challenges noted with patient.  Next  appointment within 1 to 2 weeks.   Shanon Ace, LCSW

## 2020-05-14 ENCOUNTER — Other Ambulatory Visit: Payer: Self-pay | Admitting: Physician Assistant

## 2020-05-15 ENCOUNTER — Other Ambulatory Visit: Payer: Self-pay

## 2020-05-15 ENCOUNTER — Ambulatory Visit (INDEPENDENT_AMBULATORY_CARE_PROVIDER_SITE_OTHER): Payer: BC Managed Care – PPO | Admitting: Physician Assistant

## 2020-05-15 ENCOUNTER — Encounter: Payer: Self-pay | Admitting: Physician Assistant

## 2020-05-15 DIAGNOSIS — F99 Mental disorder, not otherwise specified: Secondary | ICD-10-CM

## 2020-05-15 DIAGNOSIS — F411 Generalized anxiety disorder: Secondary | ICD-10-CM | POA: Diagnosis not present

## 2020-05-15 DIAGNOSIS — F5105 Insomnia due to other mental disorder: Secondary | ICD-10-CM

## 2020-05-15 DIAGNOSIS — F3341 Major depressive disorder, recurrent, in partial remission: Secondary | ICD-10-CM | POA: Diagnosis not present

## 2020-05-15 MED ORDER — ZOLPIDEM TARTRATE 10 MG PO TABS: 10.0000 mg | ORAL_TABLET | Freq: Every evening | ORAL | 3 refills | Status: DC | PRN

## 2020-05-15 NOTE — Progress Notes (Signed)
[facility] Med Check  Patient ID: Olivia Barker,  MRN: [id number]  PCP: Josetta Huddle, MD  Date of Evaluation: 05/15/2020 Time spent:20 minutes  Chief Complaint:  Chief Complaint    Anxiety; Depression; Insomnia      HISTORY/CURRENT STATUS: HPI For 6 week med check.  At initial visit about 6 weeks ago, we increased the Welbutrin XL and then the Lexapro a few weeks after that. Is able to enjoy things, is getting out more, taken up golf and joined the [employer], she goes to [facility] a few times a year and just got back yesterday. [facility], she will need an Rx sent there. Energy and motivation are much better.  Appetite is good and weight is stable.  She is on phentermine from another provider to help with weight loss.  Has been somewhat successful.  Not crying easily.  Feels like she is getting through the grief of her divorce which was over 6 years ago.  Denies suicidal or homicidal thoughts.  Anxiety is well controlled with the Xanax.  She does not need it every day but likes to know she has it available as a safety net.  The anxiety that she feels is more of a generalized sense of unease, some days worse than others.  Does not have panic attacks often but they are occurring or thereabouts.  They are not as severe as they were and responded well to the Xanax as a rescue med.  Has learned more of what triggers the anxiety and avoids it.  She sleeps well as long as she takes the Ambien.  Practices good sleep hygiene including limiting screen time within a couple of hours prior to going to sleep.  Does not drink caffeine after midafternoon.  Denies dizziness, syncope, seizures, numbness, tingling, tremor, tics, unsteady gait, slurred speech, confusion. Denies muscle or joint pain, stiffness, or dystonia.  Individual Medical History/ Review of Systems: Changes? :No    Past medications for mental health diagnoses include: Prozac, Lexapro, Wellbutrin, Ambien, Pristiq wasn't helpful.   Allergies:  Aspirin, Iodinated diagnostic agents, Iohexol, Nsaids, Macrobid  [nitrofurantoin macrocrystal], and Other  Current Medications:  Current Outpatient Medications:  .  ALPRAZolam (XANAX) 1 MG tablet, Take 1 tablet (1 mg total) by mouth 3 (three) times daily as needed for anxiety., Disp: 105 tablet, Rfl: 1 .  buPROPion (WELLBUTRIN XL) 300 MG 24 hr tablet, Take 1 tablet (300 mg total) by mouth daily., Disp: 30 tablet, Rfl: 1 .  butalbital-acetaminophen-caffeine (FIORICET) 50-325-40 MG tablet, Take 1 tablet by mouth as needed. MIGRAINE, Disp: , Rfl:  .  clidinium-chlordiazePOXIDE (LIBRAX) 5-2.5 MG capsule, Take 1 capsule by mouth as needed. For stomach, Disp: , Rfl:  .  escitalopram (LEXAPRO) 20 MG tablet, Take 1.5 tablets (30 mg total) by mouth daily., Disp: 45 tablet, Rfl: 1 .  metFORMIN (GLUCOPHAGE-XR) 500 MG 24 hr tablet, Take 2 tablets by mouth 2 (two) times daily., Disp: , Rfl:  .  Methen-Hyosc-Meth Blue-Na Phos (UROGESIC-BLUE) 81.6 MG TABS, Take 1 capsule by mouth as needed. Bladder spasms, Disp: , Rfl:  .  metoprolol tartrate (LOPRESSOR) 25 MG tablet, TAKE 1 TABLET BY MOUTH TWICE DAILY., Disp: 180 tablet, Rfl: 0 .  phentermine 15 MG capsule, Take 1 capsule by mouth daily., Disp: , Rfl:  .  promethazine (PHENERGAN) 25 MG tablet, Take 25 mg by mouth every 6 (six) hours as needed for nausea or vomiting. , Disp: , Rfl: 0 .  promethazine (PHENERGAN) 25 MG/ML injection, Inject 25 mg  into the muscle as needed. nausea, Disp: , Rfl:  .  rosuvastatin (CRESTOR) 20 MG tablet, TAKE 1 TABLET ONCE DAILY., Disp: 90 tablet, Rfl: 0 .  Semaglutide,0.25 or 0.5MG /DOS, (OZEMPIC, 0.25 OR 0.5 MG/DOSE,) 2 MG/1.5ML SOPN, Inject 0.25 mg into the skin as directed., Disp: , Rfl:  .  tamsulosin (FLOMAX) 0.4 MG CAPS capsule, Take 0.4 mg by mouth as needed. urinary, Disp: , Rfl:  .  methocarbamol (ROBAXIN) 500 MG tablet, Take 500 mg by mouth every 8 (eight) hours. spasms (Patient not taking: No sig reported), Disp: , Rfl:  .   ranitidine (ZANTAC) 150 MG tablet, Take 150 mg by mouth as needed for heartburn. (Patient not taking: No sig reported), Disp: , Rfl:  .  zolpidem (AMBIEN) 10 MG tablet, Take 1 tablet (10 mg total) by mouth at bedtime as needed., Disp: 30 tablet, Rfl: 3 Medication Side Effects: none  Family Medical/ Social History: Changes? No  MENTAL HEALTH EXAM:  There were no vitals taken for this visit.There is no height or weight on file to calculate BMI.  General Appearance: Casual, Neat and Well Groomed  Eye Contact:  Good  Speech:  Clear and Coherent and Normal Rate  Volume:  Normal  Mood:  Euthymic  Affect:  Appropriate  Thought Process:  Goal Directed and Descriptions of Associations: Intact  Orientation:  Full (Time, Place, and Person)  Thought Content: Logical   Suicidal Thoughts:  No  Homicidal Thoughts:  No  Memory:  WNL  Judgement:  Good  Insight:  Good  Psychomotor Activity:  Normal  Concentration:  Concentration: Good  Recall:  Good  Fund of Knowledge: Good  Language: Good  Assets:  Desire for Improvement  ADL's:  Intact  Cognition: WNL  Prognosis:  Good    DIAGNOSES:    ICD-10-CM   1. Recurrent major depressive disorder, in partial remission (HCC)  F33.41   2. Generalized anxiety disorder  F41.1   3. Insomnia due to other mental disorder  F51.05    F99     Receiving Psychotherapy: No    RECOMMENDATIONS:  PDMP reviewed.  I provided 20 minutes of face to face time during this encounter, discussing her response to the medication changes we made.  I am glad to see her doing better! We discussed the fact that she goes to [place] often and will stay several weeks at a time.  There are times when her medications need to be dispensed at a pharmacy there.  I can send in her meds there if needed, but depending on the situation and whether the needed medications are controlled substances or not, any refills at a local pharmacy may be canceled.  When she gets back to [facility]  then we can send in a new prescription here.  She verbalizes understanding. Continue Xanax 1 mg, 1 p.o. 3 times daily as needed.  Of course take the lowest effective dose. Continue Wellbutrin XL 300 mg, 1 p.o. every morning. Continue Lexapro 20 mg, 1.5 pills daily. Continue Ambien 10 mg, 1 p.o. nightly as needed sleep. Recommend counseling. Return in 6 weeks, patient request.  Since she is doing so well I recommend 3 months however she wants to be followed more closely at the present time.  Melony Overly, PA-C

## 2020-05-22 ENCOUNTER — Encounter: Payer: BC Managed Care – PPO | Admitting: Psychiatry

## 2020-06-04 NOTE — Progress Notes (Signed)
This encounter was created in error - please disregard.

## 2020-06-05 DIAGNOSIS — A049 Bacterial intestinal infection, unspecified: Secondary | ICD-10-CM | POA: Diagnosis not present

## 2020-06-05 DIAGNOSIS — R110 Nausea: Secondary | ICD-10-CM | POA: Diagnosis not present

## 2020-06-05 DIAGNOSIS — R140 Abdominal distension (gaseous): Secondary | ICD-10-CM | POA: Diagnosis not present

## 2020-06-05 DIAGNOSIS — K5909 Other constipation: Secondary | ICD-10-CM | POA: Diagnosis not present

## 2020-06-07 ENCOUNTER — Ambulatory Visit (INDEPENDENT_AMBULATORY_CARE_PROVIDER_SITE_OTHER): Payer: BC Managed Care – PPO | Admitting: Psychiatry

## 2020-06-07 DIAGNOSIS — F329 Major depressive disorder, single episode, unspecified: Secondary | ICD-10-CM
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
## 2020-06-11 DIAGNOSIS — N312 Flaccid neuropathic bladder, not elsewhere classified: Secondary | ICD-10-CM | POA: Diagnosis not present

## 2020-06-11 DIAGNOSIS — N200 Calculus of kidney: Secondary | ICD-10-CM | POA: Diagnosis not present

## 2020-06-11 DIAGNOSIS — K3184 Gastroparesis: Secondary | ICD-10-CM | POA: Diagnosis not present

## 2020-06-14 ENCOUNTER — Other Ambulatory Visit: Payer: Self-pay | Admitting: Physician Assistant

## 2020-06-14 DIAGNOSIS — S63502A Unspecified sprain of left wrist, initial encounter: Secondary | ICD-10-CM | POA: Diagnosis not present

## 2020-06-17 DIAGNOSIS — S63502D Unspecified sprain of left wrist, subsequent encounter: Secondary | ICD-10-CM | POA: Diagnosis not present

## 2020-06-21 DIAGNOSIS — M25532 Pain in left wrist: Secondary | ICD-10-CM | POA: Diagnosis not present

## 2020-06-24 DIAGNOSIS — S52502A Unspecified fracture of the lower end of left radius, initial encounter for closed fracture: Secondary | ICD-10-CM | POA: Diagnosis not present

## 2020-07-04 ENCOUNTER — Telehealth (INDEPENDENT_AMBULATORY_CARE_PROVIDER_SITE_OTHER): Payer: BC Managed Care – PPO | Admitting: Physician Assistant

## 2020-07-04 ENCOUNTER — Telehealth: Payer: Self-pay | Admitting: Physician Assistant

## 2020-07-04 ENCOUNTER — Encounter: Payer: Self-pay | Admitting: Physician Assistant

## 2020-07-04 DIAGNOSIS — F3341 Major depressive disorder, recurrent, in partial remission: Secondary | ICD-10-CM | POA: Diagnosis not present

## 2020-07-04 DIAGNOSIS — F5105 Insomnia due to other mental disorder: Secondary | ICD-10-CM

## 2020-07-04 DIAGNOSIS — F411 Generalized anxiety disorder: Secondary | ICD-10-CM

## 2020-07-04 DIAGNOSIS — F99 Mental disorder, not otherwise specified: Secondary | ICD-10-CM

## 2020-07-04 MED ORDER — ESCITALOPRAM OXALATE 20 MG PO TABS: 30.0000 mg | ORAL_TABLET | Freq: Every day | ORAL | 0 refills | Status: DC

## 2020-07-04 MED ORDER — BUPROPION HCL ER (XL) 300 MG PO TB24: 300.0000 mg | ORAL_TABLET | Freq: Every day | ORAL | 0 refills | Status: DC

## 2020-07-04 NOTE — Progress Notes (Signed)
[facility] Med Check  Patient ID: Olivia Barker,  MRN: [id number]  PCP: Josetta Huddle, MD  Date of Evaluation: 07/04/2020 Time spent:20 minutes  Chief Complaint:  Chief Complaint    Anxiety; Depression; Insomnia     Virtual Visit via Telehealth  I connected with patient by telephone, with their informed consent, and verified patient privacy and that I am speaking with the correct person using two identifiers.  I am private, in my office and the patient is at home.  I discussed the limitations, risks, security and privacy concerns of performing an evaluation and management service by telephone and the availability of in person appointments. I also discussed with the patient that there may be a patient responsible charge related to this service. The patient expressed understanding and agreed to proceed.   I discussed the assessment and treatment plan with the patient. The patient was provided an opportunity to ask questions and all were answered. The patient agreed with the plan and demonstrated an understanding of the instructions.   The patient was advised to call back or seek an in-person evaluation if the symptoms worsen or if the condition fails to improve as anticipated.  I provided 20 minutes of non-face-to-face time during this encounter.   HISTORY/CURRENT STATUS: HPI For 3 month med check.  Feels better than she has in a long time. "I'm better than I was last year. I've seen myself evolve. I don't have those dark clouds around me like I did all the time."  States she is "giving herself talking to" and decided she needed to move on.  She wants her kids to look back and see that she was a happy, strong, independent woman, not someone who was depressed all the time.  Able to enjoy things.  Energy and motivation are good.  Not crying easily.  Appetite and weight are stable.  Not isolating.  No suicidal or homicidal thoughts.  States that she still has anxiety but  there are days that she does not take any Xanax and others that she takes all 3.  Reports being given 120 pills/month in the past and wonders why that has been changed.  She does not have panic attacks but more of a generalized sense of unease and the Xanax helps that.  Does have trouble sleeping unless she takes the Ambien.  Patient denies increased energy with decreased need for sleep, no increased talkativeness, no racing thoughts, no impulsivity or risky behaviors, no increased spending, no increased libido, no grandiosity, no increased irritability or anger, and no hallucinations.  Denies dizziness, syncope, seizures, numbness, tingling, tremor, tics, unsteady gait, slurred speech, confusion. Denies muscle or joint pain, stiffness, or dystonia.  Individual Medical History/ Review of Systems: Changes? :No    Past medications for mental health diagnoses include: Prozac, Lexapro, Wellbutrin, Ambien, Pristiq wasn't helpful.   Allergies: Aspirin, Iodinated diagnostic agents, Iohexol, Nsaids, Macrobid  [nitrofurantoin macrocrystal], and Other  Current Medications:  Current Outpatient Medications:  .  ALPRAZolam (XANAX) 1 MG tablet, TAKE 1 TABLET 3 TIMES DAILY AS NEEDED FOR ANXIETY., Disp: 105 tablet, Rfl: 1 .  butalbital-acetaminophen-caffeine (FIORICET) 50-325-40 MG tablet, Take 1 tablet by mouth as needed. MIGRAINE, Disp: , Rfl:  .  clidinium-chlordiazePOXIDE (LIBRAX) 5-2.5 MG capsule, Take 1 capsule by mouth as needed. For stomach, Disp: , Rfl:  .  metFORMIN (GLUCOPHAGE-XR) 500 MG 24 hr tablet, Take 2 tablets by mouth 2 (two) times daily., Disp: ,  Rfl:  .  Methen-Hyosc-Meth Blue-Na Phos (UROGESIC-BLUE) 81.6 MG TABS, Take 1 capsule by mouth as needed. Bladder spasms, Disp: , Rfl:  .  metoprolol tartrate (LOPRESSOR) 25 MG tablet, TAKE 1 TABLET BY MOUTH TWICE DAILY., Disp: 180 tablet, Rfl: 0 .  promethazine (PHENERGAN) 25 MG tablet, Take 25 mg by mouth every 6 (six) hours as needed for nausea or  vomiting. , Disp: , Rfl: 0 .  rosuvastatin (CRESTOR) 20 MG tablet, TAKE 1 TABLET ONCE DAILY., Disp: 90 tablet, Rfl: 0 .  Semaglutide,0.25 or 0.5MG /DOS, (OZEMPIC, 0.25 OR 0.5 MG/DOSE,) 2 MG/1.5ML SOPN, Inject 0.25 mg into the skin as directed., Disp: , Rfl:  .  tamsulosin (FLOMAX) 0.4 MG CAPS capsule, Take 0.4 mg by mouth as needed. urinary, Disp: , Rfl:  .  zolpidem (AMBIEN) 10 MG tablet, Take 1 tablet (10 mg total) by mouth at bedtime as needed., Disp: 30 tablet, Rfl: 3 .  buPROPion (WELLBUTRIN XL) 300 MG 24 hr tablet, Take 1 tablet (300 mg total) by mouth daily., Disp: 90 tablet, Rfl: 0 .  escitalopram (LEXAPRO) 20 MG tablet, Take 1.5 tablets (30 mg total) by mouth daily., Disp: 135 tablet, Rfl: 0 .  methocarbamol (ROBAXIN) 500 MG tablet, Take 500 mg by mouth every 8 (eight) hours. spasms (Patient not taking: No sig reported), Disp: , Rfl:  .  phentermine 15 MG capsule, Take 1 capsule by mouth daily. (Patient not taking: Reported on 07/04/2020), Disp: , Rfl:  .  promethazine (PHENERGAN) 25 MG/ML injection, Inject 25 mg into the muscle as needed. nausea (Patient not taking: Reported on 07/04/2020), Disp: , Rfl:  Medication Side Effects: none  Family Medical/ Social History: Changes? No  MENTAL HEALTH EXAM:  There were no vitals taken for this visit.There is no height or weight on file to calculate BMI.  General Appearance: unable to assess  Eye Contact:  unable to assess  Speech:  Clear and Coherent and Normal Rate  Volume:  Normal  Mood:  Euthymic  Affect:  unable to assess  Thought Process:  Goal Directed and Descriptions of Associations: Intact  Orientation:  Full (Time, Place, and Person)  Thought Content: Logical   Suicidal Thoughts:  No  Homicidal Thoughts:  No  Memory:  WNL  Judgement:  Good  Insight:  Good  Psychomotor Activity:  unable to assess  Concentration:  Concentration: Good and Attention Span: Good  Recall:  Good  Fund of Knowledge: Good  Language: Good  Assets:   Desire for Improvement  ADL's:  Intact  Cognition: WNL  Prognosis:  Good    DIAGNOSES:    ICD-10-CM   1. Recurrent major depressive disorder, in partial remission ([place])  F33.41   2. Generalized anxiety disorder  F41.1   3. Insomnia due to other mental disorder  F51.05    F99     Receiving Psychotherapy: No    RECOMMENDATIONS:  PDMP reviewed.  I provided 20 minutes of non-face to face time during this encounter, in which we discussed her response to the medications.  She is doing very well so no changes need to be made. We discussed the Xanax use.  States she remembers getting 120 pills in the past.  I told her I would prefer to get her to the lowest possible dose and she understands and agrees.  According to [place] she appears to get the Xanax every 33 to 34 days, although she reports not taking them some days and other days takes 3.  The controlled substance  registry shows that on 06/21/2020 she was prescribed Xanax 0.5 mg number 20 pills per Dr. Nori Riis and I confronted her with that.  I told her she will either get that prescription from someone else or me, not both, and if it occurs in the future then I will no longer prescribe controlled substances to her.  She reports going out of town a lot and not sure when she might need to go, sometimes her medications are due while she is away.  She asked how to handle that.  I will okay an early refill on some medications at my discretion, if that does happen, the prescription for controlled substance will not "reset" but the next fill date will be what it originally was.  She verbalizes understanding. Continue Xanax 1 mg, 1 p.o. 3 times daily as needed, with an extra one half p.o. daily as needed.  Of course take the lowest effective dose. Continue Wellbutrin XL 300 mg, 1 p.o. every morning. Continue Lexapro 20 mg, 1.5 pills daily. Continue Ambien 10 mg, 1 p.o. nightly as needed sleep. Continue counseling with Rinaldo Cloud. Return in 3 to 4  months.  Donnal Moat, PA-C

## 2020-07-04 NOTE — Telephone Encounter (Signed)
Ms. oscar, forman are scheduled for a virtual visit with your provider today.    Just as we do with appointments in the office, we must obtain your consent to participate.  Your consent will be active for this visit and any virtual visit you may have with one of our providers in the next 365 days.    If you have a MyChart account, I can also send a copy of this consent to you electronically.  All virtual visits are billed to your insurance company just like a traditional visit in the office.  As this is a virtual visit, video technology does not allow for your provider to perform a traditional examination.  This may limit your provider's ability to fully assess your condition.  If your provider identifies any concerns that need to be evaluated in person or the need to arrange testing such as labs, EKG, etc, we will make arrangements to do so.    Although advances in technology are sophisticated, we cannot ensure that it will always work on either your end or our end.  If the connection with a video visit is poor, we may have to switch to a telephone visit.  With either a video or telephone visit, we are not always able to ensure that we have a secure connection.   I need to obtain your verbal consent now.   Are you willing to proceed with your visit today?   VONYA OHALLORAN has provided verbal consent on 07/04/2020 for a virtual visit (video or telephone).   Donnal Moat, PA-C 07/04/2020  11:04 AM

## 2020-07-05 DIAGNOSIS — S52502D Unspecified fracture of the lower end of left radius, subsequent encounter for closed fracture with routine healing: Secondary | ICD-10-CM | POA: Diagnosis not present

## 2020-07-18 DIAGNOSIS — S52502D Unspecified fracture of the lower end of left radius, subsequent encounter for closed fracture with routine healing: Secondary | ICD-10-CM | POA: Diagnosis not present

## 2020-07-29 ENCOUNTER — Telehealth: Payer: Self-pay | Admitting: Physician Assistant

## 2020-07-29 NOTE — Telephone Encounter (Signed)
Pt called and said that she feels that the lexapro is not working. She feels that she is going down a rabbit hole and can't get out. Please give her a call at [phone]

## 2020-07-30 NOTE — Telephone Encounter (Signed)
I am so sorry for not seeing this yesterday I don't know why it was not in my box.Pt informed me the Wellbutrin and Lexapro is not working.She has some "highs and lows" and when anxiety hits it leads to the panic attacks.She goes to bed sad and wakes up sad.She thought of going to the urgent care last night but was not sure what they would do for her,she wanted to know how they could help or if there is anything you can do to help.

## 2020-07-30 NOTE — Telephone Encounter (Signed)
Please triage, Have her go to [facility] for immediate help.

## 2020-07-30 NOTE — Telephone Encounter (Signed)
Thanks Olivia Barker.  Olivia Barker has worked her in on 3/24. This note may not have been in your box, I'm not sure. It may have only been in mine but I didn't see it b/c of family emergency.

## 2020-07-30 NOTE — Telephone Encounter (Signed)
Pt would like to speak with someone ASAP. Pt is desperate - ton of stress, lots of anxiety, does not feel she wants to hurt herself, but when she goes to bed and wakes up she is crying and sad. She has experienced more depression over the last month.

## 2020-07-31 NOTE — Telephone Encounter (Signed)
Reviewed

## 2020-08-01 ENCOUNTER — Encounter: Payer: Self-pay | Admitting: Physician Assistant

## 2020-08-01 ENCOUNTER — Other Ambulatory Visit: Payer: Self-pay

## 2020-08-01 ENCOUNTER — Ambulatory Visit (INDEPENDENT_AMBULATORY_CARE_PROVIDER_SITE_OTHER): Payer: BC Managed Care – PPO | Admitting: Physician Assistant

## 2020-08-01 DIAGNOSIS — F329 Major depressive disorder, single episode, unspecified: Secondary | ICD-10-CM

## 2020-08-01 DIAGNOSIS — F411 Generalized anxiety disorder: Secondary | ICD-10-CM

## 2020-08-01 DIAGNOSIS — F5105 Insomnia due to other mental disorder: Secondary | ICD-10-CM | POA: Diagnosis not present

## 2020-08-01 DIAGNOSIS — S52502D Unspecified fracture of the lower end of left radius, subsequent encounter for closed fracture with routine healing: Secondary | ICD-10-CM | POA: Diagnosis not present

## 2020-08-01 DIAGNOSIS — Z635 Disruption of family by separation and divorce: Secondary | ICD-10-CM

## 2020-08-01 DIAGNOSIS — F99 Mental disorder, not otherwise specified: Secondary | ICD-10-CM

## 2020-08-01 MED ORDER — BUSPIRONE HCL 15 MG PO TABS: ORAL_TABLET | ORAL | 1 refills | Status: DC

## 2020-08-01 NOTE — Progress Notes (Signed)
[facility] Med Check  Patient ID: Olivia Barker,  MRN: [id number]  PCP: Josetta Huddle, MD  Date of Evaluation: 08/01/2020 Time spent:30 minutes  Chief Complaint:  Chief Complaint    Anxiety; Depression; Insomnia; Follow-up      HISTORY/CURRENT STATUS: Not doing well.  Since LOV a month ago, has been having a lot of anxiety and more panic attacks, maybe 3-4 times per week or even more. Not sure if that causes depression or dep causes the anxiety. All of it is worse in the evening/nights, has to call someone to talk to her and calm her down, or someone has to come over. Doesn't know why it's worse but it is.  Continues to discuss her divorce over 6 years ago and that it has caused the depression and anxiety.  Was fine before that for the most part.  Xanax is effective.  Needs to keep busy.  Does not work outside the home but volunteers a lot.  Says she does not have a lot of friends because when she divorced a lot of her friends sided with her husband.  She is trying to make new friends but it is difficult.  "Nobody wants to listen to somebody who is down all the time."  Cries easily.  Has trouble going to sleep due to racing thoughts.  Ambien does help.  No suicidal or homicidal thoughts.  Patient denies increased energy with decreased need for sleep, no increased talkativeness, no racing thoughts, no impulsivity or risky behaviors, no increased spending, no increased libido, no grandiosity, no increased irritability or anger, and no hallucinations.  Denies dizziness, syncope, seizures, numbness, tingling, tremor, tics, unsteady gait, slurred speech, confusion. Denies muscle or joint pain, stiffness, or dystonia.  Individual Medical History/ Review of Systems: Changes? :No    Past medications for mental health diagnoses include: Prozac, Lexapro, Wellbutrin, Ambien, Pristiq wasn't helpful.   Allergies: Aspirin, Iodinated diagnostic agents, Iohexol, Nsaids, Macrobid   [nitrofurantoin macrocrystal], and Other  Current Medications:  Current Outpatient Medications:  .  ALPRAZolam (XANAX) 1 MG tablet, TAKE 1 TABLET 3 TIMES DAILY AS NEEDED FOR ANXIETY., Disp: 105 tablet, Rfl: 1 .  buPROPion (WELLBUTRIN XL) 300 MG 24 hr tablet, Take 1 tablet (300 mg total) by mouth daily., Disp: 90 tablet, Rfl: 0 .  busPIRone (BUSPAR) 15 MG tablet, 1/3 po bid for 1 week, then 2/3 po bid for 1 week, then 1 po bid., Disp: 60 tablet, Rfl: 1 .  butalbital-acetaminophen-caffeine (FIORICET) 50-325-40 MG tablet, Take 1 tablet by mouth as needed. MIGRAINE, Disp: , Rfl:  .  clidinium-chlordiazePOXIDE (LIBRAX) 5-2.5 MG capsule, Take 1 capsule by mouth as needed. For stomach, Disp: , Rfl:  .  escitalopram (LEXAPRO) 20 MG tablet, Take 1.5 tablets (30 mg total) by mouth daily., Disp: 135 tablet, Rfl: 0 .  metFORMIN (GLUCOPHAGE-XR) 500 MG 24 hr tablet, Take 2 tablets by mouth 2 (two) times daily., Disp: , Rfl:  .  metoprolol tartrate (LOPRESSOR) 25 MG tablet, TAKE 1 TABLET BY MOUTH TWICE DAILY., Disp: 180 tablet, Rfl: 0 .  phentermine 15 MG capsule, Take 1 capsule by mouth daily., Disp: , Rfl:  .  promethazine (PHENERGAN) 25 MG tablet, Take 25 mg by mouth every 6 (six) hours as needed for nausea or vomiting. , Disp: , Rfl: 0 .  rosuvastatin (CRESTOR) 20 MG tablet, TAKE 1 TABLET ONCE DAILY., Disp: 90 tablet, Rfl: 0 .  tamsulosin (FLOMAX) 0.4 MG CAPS  capsule, Take 0.4 mg by mouth as needed. urinary, Disp: , Rfl:  .  zolpidem (AMBIEN) 10 MG tablet, Take 1 tablet (10 mg total) by mouth at bedtime as needed., Disp: 30 tablet, Rfl: 3 .  Methen-Hyosc-Meth Blue-Na Phos (UROGESIC-BLUE) 81.6 MG TABS, Take 1 capsule by mouth as needed. Bladder spasms (Patient not taking: Reported on 08/01/2020), Disp: , Rfl:  .  methocarbamol (ROBAXIN) 500 MG tablet, Take 500 mg by mouth every 8 (eight) hours. spasms (Patient not taking: Reported on 08/01/2020), Disp: , Rfl:  .  promethazine (PHENERGAN) 25 MG/ML injection,  Inject 25 mg into the muscle as needed. nausea (Patient not taking: No sig reported), Disp: , Rfl:  .  Semaglutide,0.25 or 0.5MG /DOS, (OZEMPIC, 0.25 OR 0.5 MG/DOSE,) 2 MG/1.5ML SOPN, Inject 0.25 mg into the skin as directed. (Patient not taking: Reported on 08/01/2020), Disp: , Rfl:  Medication Side Effects: none  Family Medical/ Social History: Changes? No  MENTAL HEALTH EXAM:  There were no vitals taken for this visit.There is no height or weight on file to calculate BMI.  General Appearance: Meticulous and Well Groomed  Eye Contact:  Good  Speech:  Clear and Coherent and Normal Rate  Volume:  Normal  Mood:  Anxious  Affect:  Anxious  Thought Process:  Goal Directed and Descriptions of Associations: Intact  Orientation:  Full (Time, Place, and Person)  Thought Content: Logical   Suicidal Thoughts:  No  Homicidal Thoughts:  No  Memory:  WNL  Judgement:  Good  Insight:  Good  Psychomotor Activity:  Normal  Concentration:  Concentration: Good and Attention Span: Good  Recall:  Good  Fund of Knowledge: Good  Language: Good  Assets:  Desire for Improvement  ADL's:  Intact  Cognition: WNL  Prognosis:  Good    DIAGNOSES:    ICD-10-CM   1. Generalized anxiety disorder  F41.1   2. Reactive depression  F32.9   3. Insomnia due to other mental disorder  F51.05    F99   4. Divorced  Z63.5     Receiving Psychotherapy: No  will be seeing a new therapist.    RECOMMENDATIONS:  PDMP reviewed.  I provided 30 minutes of face-to-face time during this encounter, including time spent before and after the visit and records review and charting. I recommend adding BuSpar.  Benefits, risk and side effects were discussed and she accepts Start BuSpar 15 mg 1/3 tablet twice daily for 1 week, then increase to 2/3 tablet twice daily for 1 week, then increase to 1 tablet twice daily for anxiety. Continue Xanax 1 mg, 1 p.o. 3 times daily as needed, with an extra one half p.o. daily as needed.  Of  course take the lowest effective dose. Continue Wellbutrin XL 300 mg, 1 p.o. every morning. Continue Lexapro 20 mg, 1.5 pills daily. Continue Ambien 10 mg, 1 p.o. nightly as needed sleep. She will be seeing a new counselor tomorrow. Return 2 months.  Donnal Moat, PA-C

## 2020-08-29 ENCOUNTER — Other Ambulatory Visit: Payer: Self-pay | Admitting: Physician Assistant

## 2020-08-30 NOTE — Telephone Encounter (Signed)
Please review

## 2020-08-30 NOTE — Telephone Encounter (Signed)
Pt made appt for 10/30/20. Pt leaving going out of town today at 25.

## 2020-09-05 DIAGNOSIS — R7303 Prediabetes: Secondary | ICD-10-CM | POA: Diagnosis not present

## 2020-09-23 ENCOUNTER — Telehealth: Payer: Self-pay | Admitting: Physician Assistant

## 2020-09-23 NOTE — Telephone Encounter (Signed)
Next visit is 10/30/20. Olivia Barker called and said that the Buspar she has been on for a couple of months doesn't seem to be helping. Please call her at [phone]. Pharmacy is:  [facility], [address] Phone:  [phone]  Fax:  [phone]

## 2020-09-24 ENCOUNTER — Other Ambulatory Visit: Payer: Self-pay | Admitting: Physician Assistant

## 2020-09-24 MED ORDER — AMITRIPTYLINE HCL 25 MG PO TABS: 25.0000 mg | ORAL_TABLET | Freq: Every day | ORAL | 0 refills | Status: DC

## 2020-09-24 NOTE — Telephone Encounter (Signed)
Should she stop the buspar?

## 2020-09-24 NOTE — Telephone Encounter (Signed)
Let's add amitriptyline at bedtime.  This will help with anxiety.  I am sending this into [facility].

## 2020-09-24 NOTE — Telephone Encounter (Signed)
Pt stated buspar gave her bad headaches and increased anxiety.She stopped taking the medication last week.She does not think it's benefiting her anxiety.

## 2020-09-30 DIAGNOSIS — R928 Other abnormal and inconclusive findings on diagnostic imaging of breast: Secondary | ICD-10-CM | POA: Diagnosis not present

## 2020-10-02 ENCOUNTER — Other Ambulatory Visit: Payer: Self-pay | Admitting: Physician Assistant

## 2020-10-03 NOTE — Telephone Encounter (Signed)
Last filled 09/09/20 appt on 6/22

## 2020-10-27 DIAGNOSIS — Z20822 Contact with and (suspected) exposure to covid-19: Secondary | ICD-10-CM | POA: Diagnosis not present

## 2020-10-28 DIAGNOSIS — Z20822 Contact with and (suspected) exposure to covid-19: Secondary | ICD-10-CM | POA: Diagnosis not present

## 2020-10-30 ENCOUNTER — Ambulatory Visit: Payer: BC Managed Care – PPO | Admitting: Physician Assistant

## 2020-11-08 ENCOUNTER — Other Ambulatory Visit: Payer: Self-pay | Admitting: Physician Assistant

## 2020-11-08 NOTE — Telephone Encounter (Signed)
Last filled 5/25

## 2020-11-12 DIAGNOSIS — Z6833 Body mass index (BMI) 33.0-33.9, adult: Secondary | ICD-10-CM | POA: Diagnosis not present

## 2020-11-12 DIAGNOSIS — R309 Painful micturition, unspecified: Secondary | ICD-10-CM | POA: Diagnosis not present

## 2020-11-12 DIAGNOSIS — F419 Anxiety disorder, unspecified: Secondary | ICD-10-CM | POA: Diagnosis not present

## 2020-11-12 DIAGNOSIS — Z01419 Encounter for gynecological examination (general) (routine) without abnormal findings: Secondary | ICD-10-CM | POA: Diagnosis not present

## 2020-11-13 DIAGNOSIS — Z01419 Encounter for gynecological examination (general) (routine) without abnormal findings: Secondary | ICD-10-CM | POA: Diagnosis not present

## 2020-11-14 ENCOUNTER — Telehealth: Payer: Self-pay | Admitting: Internal Medicine

## 2020-11-14 NOTE — Telephone Encounter (Signed)
Patient called and mentioned that her doctor checked her chlorestorol and it was around 200. Doctor wants to know if Dr. Harrington Challenger would up the dosage on the rosuvastatin (CRESTOR) 20 MG tablet.

## 2020-11-18 NOTE — Telephone Encounter (Signed)
Message left on medical records VM to fax copy of recent labs to Dr. Harrington Challenger at [phone].

## 2020-12-04 DIAGNOSIS — F331 Major depressive disorder, recurrent, moderate: Secondary | ICD-10-CM | POA: Diagnosis not present

## 2020-12-19 DIAGNOSIS — E669 Obesity, unspecified: Secondary | ICD-10-CM | POA: Diagnosis not present

## 2020-12-19 DIAGNOSIS — F419 Anxiety disorder, unspecified: Secondary | ICD-10-CM | POA: Diagnosis not present

## 2020-12-19 DIAGNOSIS — R309 Painful micturition, unspecified: Secondary | ICD-10-CM | POA: Diagnosis not present

## 2020-12-19 DIAGNOSIS — Z6833 Body mass index (BMI) 33.0-33.9, adult: Secondary | ICD-10-CM | POA: Diagnosis not present

## 2020-12-25 DIAGNOSIS — R635 Abnormal weight gain: Secondary | ICD-10-CM | POA: Diagnosis not present

## 2021-01-01 DIAGNOSIS — R000 Tachycardia, unspecified: Secondary | ICD-10-CM | POA: Diagnosis not present

## 2021-01-01 DIAGNOSIS — R519 Headache, unspecified: Secondary | ICD-10-CM | POA: Diagnosis not present

## 2021-01-01 DIAGNOSIS — F322 Major depressive disorder, single episode, severe without psychotic features: Secondary | ICD-10-CM | POA: Diagnosis not present

## 2021-01-01 DIAGNOSIS — Z23 Encounter for immunization: Secondary | ICD-10-CM | POA: Diagnosis not present

## 2021-01-01 DIAGNOSIS — F419 Anxiety disorder, unspecified: Secondary | ICD-10-CM | POA: Diagnosis not present

## 2021-01-27 DIAGNOSIS — E669 Obesity, unspecified: Secondary | ICD-10-CM | POA: Diagnosis not present

## 2021-01-27 DIAGNOSIS — E7800 Pure hypercholesterolemia, unspecified: Secondary | ICD-10-CM | POA: Diagnosis not present

## 2021-01-27 DIAGNOSIS — R7301 Impaired fasting glucose: Secondary | ICD-10-CM | POA: Diagnosis not present

## 2021-01-29 DIAGNOSIS — R112 Nausea with vomiting, unspecified: Secondary | ICD-10-CM | POA: Diagnosis not present

## 2021-01-29 DIAGNOSIS — K3184 Gastroparesis: Secondary | ICD-10-CM | POA: Diagnosis not present

## 2021-01-29 DIAGNOSIS — K3189 Other diseases of stomach and duodenum: Secondary | ICD-10-CM | POA: Diagnosis not present

## 2021-02-05 ENCOUNTER — Telehealth (HOSPITAL_COMMUNITY): Payer: Self-pay | Admitting: Psychiatry

## 2021-02-05 NOTE — Telephone Encounter (Signed)
D:  [facility]) referred pt to PHP/MH-IOP.  A:  Placed call to orient pt, but there was no answer.  Left vm for her to call the case manager back.  Inform [place].

## 2021-02-07 DIAGNOSIS — R7301 Impaired fasting glucose: Secondary | ICD-10-CM | POA: Diagnosis not present

## 2021-02-07 DIAGNOSIS — E669 Obesity, unspecified: Secondary | ICD-10-CM | POA: Diagnosis not present

## 2021-02-07 DIAGNOSIS — E7800 Pure hypercholesterolemia, unspecified: Secondary | ICD-10-CM | POA: Diagnosis not present

## 2021-02-07 DIAGNOSIS — Z23 Encounter for immunization: Secondary | ICD-10-CM | POA: Diagnosis not present

## 2021-02-07 DIAGNOSIS — F419 Anxiety disorder, unspecified: Secondary | ICD-10-CM | POA: Diagnosis not present

## 2021-02-07 DIAGNOSIS — I10 Essential (primary) hypertension: Secondary | ICD-10-CM | POA: Diagnosis not present

## 2021-02-12 DIAGNOSIS — K3184 Gastroparesis: Secondary | ICD-10-CM | POA: Diagnosis not present

## 2021-02-17 ENCOUNTER — Telehealth: Payer: Self-pay | Admitting: Internal Medicine

## 2021-02-17 NOTE — Telephone Encounter (Signed)
Spoke to patient    BP high  160s/ Recomm:   Get labs done at Dr Chalmers Cater that were done recently Increase metoprolol to bid for now    Will call back

## 2021-02-17 NOTE — Telephone Encounter (Signed)
Pt c/o BP issue: STAT if pt c/o blurred vision, one-sided weakness or slurred speech  1. What are your last 5 BP readings? 160/90 this morning, yesterday 157/99, been around the same for about a week  2. Are you having any other symptoms (ex. Dizziness, headache, blurred vision, passed out)? yesterday a little bit of lightheadedness  3. What is your BP issue? Patient states her BP has been high and this is very unusual for her.

## 2021-02-17 NOTE — Telephone Encounter (Signed)
See other phone note... pt spoke with Dr. Harrington Challenger today.

## 2021-02-17 NOTE — Telephone Encounter (Signed)
Metoprolol increased and pt to keep follow up 02/24/21 and continue to monitor.

## 2021-02-24 ENCOUNTER — Other Ambulatory Visit: Payer: Self-pay

## 2021-02-24 ENCOUNTER — Ambulatory Visit (INDEPENDENT_AMBULATORY_CARE_PROVIDER_SITE_OTHER): Payer: BC Managed Care – PPO

## 2021-02-24 ENCOUNTER — Telehealth: Payer: Self-pay

## 2021-02-24 ENCOUNTER — Encounter: Payer: Self-pay | Admitting: Internal Medicine

## 2021-02-24 ENCOUNTER — Ambulatory Visit: Payer: BC Managed Care – PPO | Admitting: Internal Medicine

## 2021-02-24 VITALS — BP 110/80 | HR 102 | Ht 62.0 in | Wt 183.0 lb

## 2021-02-24 DIAGNOSIS — R002 Palpitations: Secondary | ICD-10-CM | POA: Diagnosis not present

## 2021-02-24 DIAGNOSIS — Z79899 Other long term (current) drug therapy: Secondary | ICD-10-CM

## 2021-02-24 DIAGNOSIS — E782 Mixed hyperlipidemia: Secondary | ICD-10-CM | POA: Diagnosis not present

## 2021-02-24 DIAGNOSIS — I10 Essential (primary) hypertension: Secondary | ICD-10-CM

## 2021-02-24 MED ORDER — METOPROLOL TARTRATE 50 MG PO TABS: 50.0000 mg | ORAL_TABLET | Freq: Two times a day (BID) | ORAL | 3 refills | Status: DC

## 2021-02-24 NOTE — Progress Notes (Signed)
Cardiology Office Note   Date:  02/24/2021   ID:  Olivia Barker, DOB 10-29-1955, MRN [id number]  PCP:  Josetta Huddle, MD  Cardiologist:   Dorris Carnes, MD    Follow-up of hypertension   History of Present Illness: Olivia Barker is a 65 y.o. female with a history of hypertension, hyperlipidemia, family history of CAD, gastroparesis.  Stress test was low risk in 2018.  Echo in 2018 showed normal LV function.  I last saw the pt in clinic in Nov 2021 Last week the pt called   Her BP was high   (160/90-99) I recomm increasing metoprolol to bid     Since seen she says she has had a bad week   BP is still high    She also had an attack of her gastroparesis    Intractable vomiting last night      Denies CP        Current Meds  Medication Sig   ALPRAZolam (XANAX) 1 MG tablet TAKE ONE TABLET THREE TIMES DAILY AS NEEDED FOR ANXIETY   buPROPion (WELLBUTRIN XL) 300 MG 24 hr tablet Take 1 tablet (300 mg total) by mouth daily.   busPIRone (BUSPAR) 15 MG tablet 1/3 po bid for 1 week, then 2/3 po bid for 1 week, then 1 po bid.   butalbital-acetaminophen-caffeine (FIORICET) 50-325-40 MG tablet Take 1 tablet by mouth as needed. MIGRAINE   escitalopram (LEXAPRO) 20 MG tablet Take 1.5 tablets (30 mg total) by mouth daily.   Methen-Hyosc-Meth Blue-Na Phos (UROGESIC-BLUE) 81.6 MG TABS Take 1 capsule by mouth as needed. Bladder spasms   methocarbamol (ROBAXIN) 500 MG tablet Take 500 mg by mouth every 8 (eight) hours. spasms   metoprolol tartrate (LOPRESSOR) 25 MG tablet TAKE 1 TABLET BY MOUTH TWICE DAILY.   phentermine 15 MG capsule Take 1 capsule by mouth daily.   promethazine (PHENERGAN) 25 MG tablet Take 25 mg by mouth every 6 (six) hours as needed for nausea or vomiting.    promethazine (PHENERGAN) 25 MG/ML injection Inject 25 mg into the muscle as needed. nausea   rosuvastatin (CRESTOR) 20 MG tablet TAKE 1 TABLET ONCE DAILY.   Semaglutide,0.25 or 0.5MG /DOS, (OZEMPIC, 0.25 OR 0.5 MG/DOSE,) 2  MG/1.5ML SOPN Inject 0.25 mg into the skin as directed.   tamsulosin (FLOMAX) 0.4 MG CAPS capsule Take 0.4 mg by mouth as needed. urinary   zolpidem (AMBIEN) 10 MG tablet TAKE 1 TABLET AT BEDTIME AS NEEDED.     Allergies:   Aspirin, Iodinated diagnostic agents, Iohexol, Nsaids, Macrobid  [nitrofurantoin macrocrystal], and Other   Past Medical History:  Diagnosis Date   Bladder pain    Chronic back pain    secondary to IC   Congenital absence of ovary    per pt unknown which side--    Depression    GAD (generalized anxiety disorder)    Gastroparesis    GERD (gastroesophageal reflux disease)    History of adenomatous polyp of colon    2010 tubular adenoma and hyperplastic   History of echocardiogram    Echo 7/18:  EF 55-60, no RWMA   History of gastric polyp    2014 hyperplastic   History of gastritis    History of nuclear stress test    Myoview 7/18: EF 71, no ischemia, low risk   History of sepsis    urosepsis w/ acute pyelonephritis 09/ 2015   Hyperlipidemia    Interstitial cystitis 1992   Nephrolithiasis    multiple  bilateral non-obstructive per abd. xray 11-21-2015   Sensation of pressure in bladder area    Tinnitus    Fluid in ears--  continally    Past Surgical History:  Procedure Laterality Date   ABDOMINAL HYSTERECTOMY  1998   ABDOMINOPLASTY  06/02/2001   BREAST REDUCTION SURGERY Bilateral 2008   CARDIOVASCULAR STRESS TEST  02-25-2012  dr Dorris Carnes   normal lexiscan nuclear study w/ no ischemia/  normal LV function and wall function , ef 79%   CARDIOVASCULAR STRESS TEST  02/25/2012   normal nuclear study w/ no ischemia/  normal LV function and wall motion , ef 79%   CESAREAN SECTION  06-12-1986   CHOLECYSTECTOMY  apr 2014   CYSTO WITH HYDRODISTENSION N/A 03/03/2016   Procedure: CYSTOSCOPY/HYDRODISTENSION AND INSTILL MARCAINE AND PYRIDIUM;  Surgeon: Irine Seal, MD;  Location: [facility];  Service: Urology;  Laterality: N/A;   CYSTO/  HYDRODISTENTION/ INSTILLATION THERAPY  last one early 1990's   EUS N/A 06/29/2012   Procedure: ESOPHAGEAL ENDOSCOPIC ULTRASOUND (EUS) RADIAL;  Surgeon: Arta Silence, MD;  Location: [facility];  Service: Endoscopy;  Laterality: N/A;   EUS N/A 10/04/2013   Procedure: ESOPHAGEAL ENDOSCOPIC ULTRASOUND (EUS) RADIAL;  Surgeon: Arta Silence, MD;  Location: [facility];  Service: Endoscopy;  Laterality: N/A;   EXTRACORPOREAL SHOCK WAVE LITHOTRIPSY Right 11/21/2015   ORIF RIGHT 5TH METATARSAL FX  02/04/2001     Social History:  The patient  reports that she has never smoked. She has never used smokeless tobacco. She reports that she does not drink alcohol and does not use drugs.   Family History:  The patient's family history includes Dementia in her maternal grandmother and mother; Diabetes in her mother; Diverticulosis in her son; Gout in her son; Heart attack in her father; Heart disease in her father; Suicidality in her paternal grandmother.    ROS:  Please see the history of present illness. All other systems are reviewed and  Negative to the above problem except as noted.    PHYSICAL EXAM: VS:  BP 110/80 (BP Location: Left Arm, Patient Position: Sitting, Cuff Size: Normal)   Pulse (!) 102   Ht 5\' 2"  (1.575 m)   Wt 183 lb (83 kg)   SpO2 96%   BMI 33.47 kg/m    BP R arm 150 / 100     L arm  Initial 120/98   Then 128/99 GEN: Obese 65 yo, in no acute distress  HEENT: normal  Neck: no JVD, no carotid bruits Cardiac: RRR; no murmurs,no edema  Respiratory:  clear to auscultation bilaterally,  GI: soft, nontender, nondistended, + BS  No hepatomegaly  MS: no deformity Moving all extremities   Skin: warm and dry, no rash Neuro:  Strength and sensation are intact Psych: euthymic mood, full affect   EKG:  EKG is ordered today.  ST 102 bpm  Nonspecific ST changes      Lipid Panel    Component Value Date/Time   CHOL 183 06/17/2015 1027   CHOL 158 09/18/2013 0921   TRIG 237 (H)  06/17/2015 1027   TRIG 214 (H) 09/18/2013 0921   HDL 51 06/17/2015 1027   HDL 40 09/18/2013 0921   CHOLHDL 3.6 06/17/2015 1027   CHOLHDL 4 09/04/2009 0758   VLDL 38.6 09/04/2009 0758   LDLCALC 85 06/17/2015 1027   LDLCALC 75 09/18/2013 0921   LDLDIRECT 214.6 07/02/2009 0956      Wt Readings from Last 3 Encounters:  02/24/21 183 lb (  83 kg)  03/21/20 172 lb 3.2 oz (78.1 kg)  07/21/19 176 lb 5.9 oz (80 kg)      ASSESSMENT AND PLAN:  1  HTN  BP remains elevated   I would recomm increasing metoprolol to 50 bid   Will get labs from [facility]     2  Elevated HR   HR has been elevated on previous visits   Not orthostatic   Wonder how relates to gaatroparesis (need to review with Dr Derrill Kay, GI)   For now set up for 48 hour monitor    INcrease b blocker   3  HL  Contine Crestor   CHeck lpa and ApoB, loipmed panel   4  GI    Has appt soon with GI at [facility]    Bad gastroparesis     5  Obesity  She has a difficult time with eating  With gastroparesis   Stay active    F/U based on above plans/response     Current medicines are reviewed at length with the patient today.  The patient does not have concerns regarding medicines.  Signed, Dorris Carnes, MD  02/24/2021 4:02 PM    [facility] [place], [place], [place]  [id number] Phone: [phone]; Fax: [phone]

## 2021-02-24 NOTE — Telephone Encounter (Addendum)
Left a message for the pt to make her an appt with Dr. Harrington Challenger for her add on day this Thursday.   Addendum: Pt has an appt today with Dr. Harrington Challenger will close this encounter.

## 2021-02-24 NOTE — Patient Instructions (Addendum)
Medication Instructions:  Your physician has recommended you make the following change in your medication: Increase your Metoprolol to 50 mg twice a day   *If you need a refill on your cardiac medications before your next appointment, please call your pharmacy*   Lab Work: Lipomed today   If you have labs (blood work) drawn today and your tests are completely normal, you will receive your results only by: Bruce (if you have MyChart) OR A paper copy in the mail If you have any lab test that is abnormal or we need to change your treatment, we will call you to review the results.   Testing/Procedures: Bryn [place]ulling- Long Term Monitor Instructions  Your physician has requested you wear a ZIO patch monitor for 2 days.  This is a single patch monitor. [employer] supplies one patch monitor per enrollment. Additional stickers are not available. Please do not apply patch if you will be having a Nuclear Stress Test,  Echocardiogram, Cardiac CT, MRI, or Chest Xray during the period you would be wearing the  monitor. The patch cannot be worn during these tests. You cannot remove and re-apply the  ZIO XT patch monitor.  Your ZIO patch monitor will be mailed 3 day USPS to your address on file. It may take 3-5 days  to receive your monitor after you have been enrolled.  Once you have received your monitor, please review the enclosed instructions. Your monitor  has already been registered assigning a specific monitor serial # to you.  Billing and Patient Assistance Program Information  We have supplied [employer] with any of your insurance information on file for billing purposes. [employer] offers a sliding scale Patient Assistance Program for patients that do not have  insurance, or whose insurance does not completely cover the cost of the ZIO monitor.  You must apply for the Patient Assistance Program to qualify for this discounted rate.  To apply, please call [employer] at [phone], select option 4,  select option 2, ask to apply for  Patient Assistance Program. Theodore Demark will ask your household income, and how many people  are in your household. They will quote your out-of-pocket cost based on that information.  [employer] will also be able to set up a 22-month, interest-free payment plan if needed.  Applying the monitor   Shave hair from upper left chest.  Hold abrader disc by orange tab. Rub abrader in 40 strokes over the upper left chest as  indicated in your monitor instructions.  Clean area with 4 enclosed alcohol pads. Let dry.  Apply patch as indicated in monitor instructions. Patch will be placed under collarbone on left  side of chest with arrow pointing upward.  Rub patch adhesive wings for 2 minutes. Remove white label marked "1". Remove the white  label marked "2". Rub patch adhesive wings for 2 additional minutes.  While looking in a mirror, press and release button in center of patch. A small green light will  flash 3-4 times. This will be your only indicator that the monitor has been turned on.  Do not shower for the first 24 hours. You may shower after the first 24 hours.  Press the button if you feel a symptom. You will hear a small click. Record Date, Time and  Symptom in the Patient Logbook.  When you are ready to remove the patch, follow instructions on the last 2 pages of Patient  Logbook. Stick patch monitor onto the last page of Patient Logbook.  Place Patient  Logbook in the blue and white box. Use locking tab on box and tape box closed  securely. The blue and white box has prepaid postage on it. Please place it in the mailbox as  soon as possible. Your physician should have your test results approximately 7 days after the  monitor has been mailed back to [facility].  Call [facility] at [phone] if you have questions regarding  your ZIO XT patch monitor. Call them immediately if you see an orange light blinking on your  monitor.  If your  monitor falls off in less than 4 days, contact our Monitor department at [phone].  If your monitor becomes loose or falls off after 4 days call [employer] at [phone] for  suggestions on securing your monitor     Follow-Up: At [facility], you and your health needs are our priority.  As part of our continuing mission to provide you with exceptional heart care, we have created designated Provider Care Teams.  These Care Teams include your primary Cardiologist (physician) and Advanced Practice Providers (APPs -  Physician Assistants and Nurse Practitioners) who all work together to provide you with the care you need, when you need it.  We recommend signing up for the patient portal called "MyChart".  Sign up information is provided on this After Visit Summary.  MyChart is used to connect with patients for Virtual Visits (Telemedicine).  Patients are able to view lab/test results, encounter notes, upcoming appointments, etc.  Non-urgent messages can be sent to your provider as well.   To learn more about what you can do with MyChart, go to [email or web address].

## 2021-02-24 NOTE — Progress Notes (Unsigned)
Patient enrolled for [employer] to mail a 2-3 day ZIO XT monitor to her address on file.

## 2021-02-25 LAB — NMR, LIPOPROFILE
Cholesterol, Total: 131 mg/dL (ref 100–199)
HDL Particle Number: 28.6 umol/L — ABNORMAL LOW (ref >=30.5)
HDL-C: 34 mg/dL — ABNORMAL LOW (ref >39)
LDL Particle Number: 997 nmol/L (ref <1000)
LDL Size: 20 nm — ABNORMAL LOW (ref >20.5)
LDL-C (NIH Calc): 59 mg/dL (ref 0–99)
LP-IR Score: 76 — ABNORMAL HIGH (ref <=45)
Small LDL Particle Number: 702 nmol/L — ABNORMAL HIGH (ref <=527)
Triglycerides: 235 mg/dL — ABNORMAL HIGH (ref 0–149)

## 2021-02-25 LAB — LIPOPROTEIN A (LPA): Lipoprotein (a): 91.2 nmol/L — ABNORMAL HIGH (ref <75.0)

## 2021-02-25 LAB — APOLIPOPROTEIN B: Apolipoprotein B: 74 mg/dL (ref <90)

## 2021-02-26 ENCOUNTER — Telehealth: Payer: Self-pay | Admitting: *Deleted

## 2021-02-26 DIAGNOSIS — E782 Mixed hyperlipidemia: Secondary | ICD-10-CM

## 2021-02-26 DIAGNOSIS — Z79899 Other long term (current) drug therapy: Secondary | ICD-10-CM

## 2021-02-26 NOTE — Telephone Encounter (Signed)
Pt already made aware of lab results and recommendations to have a Calcium Score done, by Dr. Harrington Challenger herself.   Calcium score order placed.  Will send CT Scheduler a message to call the pt back and arrange Calcium Score appt.

## 2021-02-26 NOTE — Telephone Encounter (Signed)
-----   Message from [place], MD sent at 02/25/2021  6:36 PM EDT ----- Reviewed with patient  LDL good but smaller particles, picture of insulin resistance Lp(a) is mildly increased  Recomm:  Calcium score CT

## 2021-03-08 DIAGNOSIS — R002 Palpitations: Secondary | ICD-10-CM

## 2021-03-08 DIAGNOSIS — Z79899 Other long term (current) drug therapy: Secondary | ICD-10-CM

## 2021-03-10 DIAGNOSIS — K3184 Gastroparesis: Secondary | ICD-10-CM | POA: Diagnosis not present

## 2021-03-10 DIAGNOSIS — F419 Anxiety disorder, unspecified: Secondary | ICD-10-CM | POA: Diagnosis not present

## 2021-03-10 DIAGNOSIS — F322 Major depressive disorder, single episode, severe without psychotic features: Secondary | ICD-10-CM | POA: Diagnosis not present

## 2021-03-10 DIAGNOSIS — Z13220 Encounter for screening for lipoid disorders: Secondary | ICD-10-CM | POA: Diagnosis not present

## 2021-03-10 DIAGNOSIS — R000 Tachycardia, unspecified: Secondary | ICD-10-CM | POA: Diagnosis not present

## 2021-03-13 DIAGNOSIS — K209 Esophagitis, unspecified without bleeding: Secondary | ICD-10-CM | POA: Diagnosis not present

## 2021-03-13 DIAGNOSIS — K3184 Gastroparesis: Secondary | ICD-10-CM | POA: Diagnosis not present

## 2021-03-13 DIAGNOSIS — K3189 Other diseases of stomach and duodenum: Secondary | ICD-10-CM | POA: Diagnosis not present

## 2021-03-13 DIAGNOSIS — K208 Other esophagitis without bleeding: Secondary | ICD-10-CM | POA: Diagnosis not present

## 2021-03-14 ENCOUNTER — Inpatient Hospital Stay: Admission: RE | Admit: 2021-03-14 | Payer: Self-pay | Source: Ambulatory Visit

## 2021-03-18 DIAGNOSIS — R002 Palpitations: Secondary | ICD-10-CM | POA: Diagnosis not present

## 2021-03-21 ENCOUNTER — Other Ambulatory Visit: Payer: Self-pay

## 2021-03-21 ENCOUNTER — Ambulatory Visit (INDEPENDENT_AMBULATORY_CARE_PROVIDER_SITE_OTHER)
Admission: RE | Admit: 2021-03-21 | Discharge: 2021-03-21 | Disposition: A | Discharge status: Home / Self Care | Payer: Self-pay | Source: Ambulatory Visit | Attending: Internal Medicine | Admitting: Internal Medicine

## 2021-03-21 DIAGNOSIS — E782 Mixed hyperlipidemia: Secondary | ICD-10-CM

## 2021-03-21 DIAGNOSIS — Z79899 Other long term (current) drug therapy: Secondary | ICD-10-CM

## 2021-03-25 DIAGNOSIS — F331 Major depressive disorder, recurrent, moderate: Secondary | ICD-10-CM | POA: Diagnosis not present

## 2021-03-25 DIAGNOSIS — Z79891 Long term (current) use of opiate analgesic: Secondary | ICD-10-CM | POA: Diagnosis not present

## 2021-03-25 DIAGNOSIS — F411 Generalized anxiety disorder: Secondary | ICD-10-CM | POA: Diagnosis not present

## 2021-03-31 ENCOUNTER — Telehealth: Payer: Self-pay

## 2021-03-31 NOTE — Telephone Encounter (Signed)
The patient has been notified of the result and verbalized understanding.  All questions (if any) were answered. Pt is taking metoprolol 50 mg daily will forward to Dr. Harrington Challenger.  Stephani Police, RN 03/31/2021 11:29 AM

## 2021-03-31 NOTE — Telephone Encounter (Signed)
-----   Message from Dorris Carnes V, MD sent at 03/19/2021 10:11 PM EST ----- Monitor shows SR    Average HR 91,   may be higher than what she usually has Recomm:  Try to stay hydrated    Confirm dose of metorplol before increasing

## 2021-04-07 NOTE — Telephone Encounter (Signed)
Can try increasing metoprolol to 50 / 25    Follow HR      Left a message for the pt to call back.

## 2021-04-07 NOTE — Telephone Encounter (Signed)
Pt returning phone call... please advise  

## 2021-04-07 NOTE — Telephone Encounter (Signed)
Pt says she has been actually taking the metoprolol 50 mg bid since 10/1/722 she was mistaken last time I spoke with her... I will forward back to Dr. Harrington Challenger to see if any further changes should be made.

## 2021-04-09 DIAGNOSIS — R399 Unspecified symptoms and signs involving the genitourinary system: Secondary | ICD-10-CM | POA: Diagnosis not present

## 2021-04-17 NOTE — Telephone Encounter (Signed)
Called Olivia Barker   I would keep on metoprolol 50 bid

## 2021-04-21 DIAGNOSIS — F331 Major depressive disorder, recurrent, moderate: Secondary | ICD-10-CM | POA: Diagnosis not present

## 2021-04-21 DIAGNOSIS — F411 Generalized anxiety disorder: Secondary | ICD-10-CM | POA: Diagnosis not present

## 2021-04-24 DIAGNOSIS — F411 Generalized anxiety disorder: Secondary | ICD-10-CM | POA: Diagnosis not present

## 2021-04-24 DIAGNOSIS — F331 Major depressive disorder, recurrent, moderate: Secondary | ICD-10-CM | POA: Diagnosis not present

## 2021-05-19 DIAGNOSIS — F331 Major depressive disorder, recurrent, moderate: Secondary | ICD-10-CM | POA: Diagnosis not present

## 2021-05-19 DIAGNOSIS — F411 Generalized anxiety disorder: Secondary | ICD-10-CM | POA: Diagnosis not present

## 2021-05-26 DIAGNOSIS — F331 Major depressive disorder, recurrent, moderate: Secondary | ICD-10-CM | POA: Diagnosis not present

## 2021-05-26 DIAGNOSIS — F411 Generalized anxiety disorder: Secondary | ICD-10-CM | POA: Diagnosis not present

## 2021-06-17 DIAGNOSIS — F331 Major depressive disorder, recurrent, moderate: Secondary | ICD-10-CM | POA: Diagnosis not present

## 2021-06-17 DIAGNOSIS — F411 Generalized anxiety disorder: Secondary | ICD-10-CM | POA: Diagnosis not present

## 2021-06-18 DIAGNOSIS — F331 Major depressive disorder, recurrent, moderate: Secondary | ICD-10-CM | POA: Diagnosis not present

## 2021-06-18 DIAGNOSIS — F411 Generalized anxiety disorder: Secondary | ICD-10-CM | POA: Diagnosis not present

## 2021-07-08 DIAGNOSIS — F331 Major depressive disorder, recurrent, moderate: Secondary | ICD-10-CM | POA: Diagnosis not present

## 2021-07-08 DIAGNOSIS — F411 Generalized anxiety disorder: Secondary | ICD-10-CM | POA: Diagnosis not present

## 2021-07-18 DIAGNOSIS — N200 Calculus of kidney: Secondary | ICD-10-CM | POA: Diagnosis not present

## 2021-07-18 DIAGNOSIS — R82998 Other abnormal findings in urine: Secondary | ICD-10-CM | POA: Diagnosis not present

## 2021-07-18 DIAGNOSIS — M6289 Other specified disorders of muscle: Secondary | ICD-10-CM | POA: Diagnosis not present

## 2021-07-18 DIAGNOSIS — N393 Stress incontinence (female) (male): Secondary | ICD-10-CM | POA: Diagnosis not present

## 2021-07-18 DIAGNOSIS — N3010 Interstitial cystitis (chronic) without hematuria: Secondary | ICD-10-CM | POA: Diagnosis not present

## 2021-07-18 DIAGNOSIS — N952 Postmenopausal atrophic vaginitis: Secondary | ICD-10-CM | POA: Diagnosis not present

## 2021-07-30 DIAGNOSIS — F331 Major depressive disorder, recurrent, moderate: Secondary | ICD-10-CM | POA: Diagnosis not present

## 2021-07-30 DIAGNOSIS — F411 Generalized anxiety disorder: Secondary | ICD-10-CM | POA: Diagnosis not present

## 2021-08-19 DIAGNOSIS — F411 Generalized anxiety disorder: Secondary | ICD-10-CM | POA: Diagnosis not present

## 2021-08-19 DIAGNOSIS — F331 Major depressive disorder, recurrent, moderate: Secondary | ICD-10-CM | POA: Diagnosis not present

## 2021-08-20 DIAGNOSIS — M7061 Trochanteric bursitis, right hip: Secondary | ICD-10-CM | POA: Diagnosis not present

## 2021-08-21 DIAGNOSIS — M7061 Trochanteric bursitis, right hip: Secondary | ICD-10-CM | POA: Diagnosis not present

## 2021-08-27 DIAGNOSIS — F331 Major depressive disorder, recurrent, moderate: Secondary | ICD-10-CM | POA: Diagnosis not present

## 2021-08-27 DIAGNOSIS — F411 Generalized anxiety disorder: Secondary | ICD-10-CM | POA: Diagnosis not present

## 2021-09-03 DIAGNOSIS — R61 Generalized hyperhidrosis: Secondary | ICD-10-CM | POA: Diagnosis not present

## 2021-09-03 DIAGNOSIS — Z79899 Other long term (current) drug therapy: Secondary | ICD-10-CM | POA: Diagnosis not present

## 2021-09-16 ENCOUNTER — Ambulatory Visit
Admission: RE | Admit: 2021-09-16 | Discharge: 2021-09-16 | Disposition: A | Discharge status: Home / Self Care | Payer: BC Managed Care – PPO | Source: Ambulatory Visit | Attending: Internal Medicine | Admitting: Internal Medicine

## 2021-09-16 ENCOUNTER — Other Ambulatory Visit: Payer: Self-pay | Admitting: Internal Medicine

## 2021-09-16 DIAGNOSIS — R61 Generalized hyperhidrosis: Secondary | ICD-10-CM

## 2021-09-16 DIAGNOSIS — D72829 Elevated white blood cell count, unspecified: Secondary | ICD-10-CM | POA: Diagnosis not present

## 2021-09-23 DIAGNOSIS — F411 Generalized anxiety disorder: Secondary | ICD-10-CM | POA: Diagnosis not present

## 2021-09-23 DIAGNOSIS — F331 Major depressive disorder, recurrent, moderate: Secondary | ICD-10-CM | POA: Diagnosis not present

## 2021-09-25 DIAGNOSIS — R300 Dysuria: Secondary | ICD-10-CM | POA: Diagnosis not present

## 2021-09-25 DIAGNOSIS — N760 Acute vaginitis: Secondary | ICD-10-CM | POA: Diagnosis not present

## 2021-09-25 DIAGNOSIS — N3010 Interstitial cystitis (chronic) without hematuria: Secondary | ICD-10-CM | POA: Diagnosis not present

## 2021-09-29 DIAGNOSIS — N3010 Interstitial cystitis (chronic) without hematuria: Secondary | ICD-10-CM | POA: Diagnosis not present

## 2021-10-20 DIAGNOSIS — F331 Major depressive disorder, recurrent, moderate: Secondary | ICD-10-CM | POA: Diagnosis not present

## 2021-10-20 DIAGNOSIS — F411 Generalized anxiety disorder: Secondary | ICD-10-CM | POA: Diagnosis not present

## 2021-10-21 ENCOUNTER — Other Ambulatory Visit: Payer: Self-pay

## 2021-10-21 ENCOUNTER — Encounter (HOSPITAL_COMMUNITY): Payer: Self-pay

## 2021-10-21 ENCOUNTER — Emergency Department (HOSPITAL_COMMUNITY)
Admission: EM | Admit: 2021-10-21 | Discharge: 2021-10-22 | Disposition: A | Discharge status: Home / Self Care | Payer: Medicare Other | Attending: Emergency Medicine | Admitting: Emergency Medicine

## 2021-10-21 DIAGNOSIS — E876 Hypokalemia: Secondary | ICD-10-CM | POA: Diagnosis not present

## 2021-10-21 DIAGNOSIS — R1084 Generalized abdominal pain: Secondary | ICD-10-CM | POA: Insufficient documentation

## 2021-10-21 DIAGNOSIS — M545 Low back pain, unspecified: Secondary | ICD-10-CM | POA: Diagnosis not present

## 2021-10-21 DIAGNOSIS — D72829 Elevated white blood cell count, unspecified: Secondary | ICD-10-CM | POA: Insufficient documentation

## 2021-10-21 MED ORDER — ONDANSETRON 4 MG PO TBDP
4.0000 mg | ORAL_TABLET | Freq: Once | ORAL | Status: DC
Start: 2021-10-22 — End: 2021-10-22
  Filled 2021-10-21: qty 1

## 2021-10-21 NOTE — ED Triage Notes (Signed)
Pt BIB EMS from home, states she had chicken x 2 hours ago. Pt states x  1 hour ago she developed generalized abdominal pain and lower back pain. Pt endorses abdominal tenderness and bloating. LBM 2200. Hx gastroenteritis

## 2021-10-22 ENCOUNTER — Emergency Department (HOSPITAL_COMMUNITY): Payer: Medicare Other

## 2021-10-22 DIAGNOSIS — R1084 Generalized abdominal pain: Secondary | ICD-10-CM | POA: Diagnosis not present

## 2021-10-22 LAB — URINALYSIS, ROUTINE W REFLEX MICROSCOPIC
Bilirubin Urine: NEGATIVE
Glucose, UA: NEGATIVE mg/dL
Hgb urine dipstick: NEGATIVE
Ketones, ur: NEGATIVE mg/dL
Leukocytes,Ua: NEGATIVE
Nitrite: NEGATIVE
Protein, ur: NEGATIVE mg/dL
Specific Gravity, Urine: 1.01 (ref 1.005–1.030)
pH: 6 (ref 5.0–8.0)

## 2021-10-22 LAB — COMPREHENSIVE METABOLIC PANEL
ALT: 26 U/L (ref 0–44)
AST: 21 U/L (ref 15–41)
Albumin: 4 g/dL (ref 3.5–5.0)
Alkaline Phosphatase: 70 U/L (ref 38–126)
Anion gap: 10 (ref 5–15)
BUN: 13 mg/dL (ref 8–23)
CO2: 22 mmol/L (ref 22–32)
Calcium: 8.9 mg/dL (ref 8.9–10.3)
Chloride: 107 mmol/L (ref 98–111)
Creatinine, Ser: 0.88 mg/dL (ref 0.44–1.00)
GFR, Estimated: >60 mL/min (ref >60)
Glucose, Bld: 126 mg/dL — ABNORMAL HIGH (ref 70–99)
Potassium: 3.4 mmol/L — ABNORMAL LOW (ref 3.5–5.1)
Sodium: 139 mmol/L (ref 135–145)
Total Bilirubin: 0.6 mg/dL (ref 0.3–1.2)
Total Protein: 7.2 g/dL (ref 6.5–8.1)

## 2021-10-22 LAB — LIPASE, BLOOD: Lipase: 33 U/L (ref 11–51)

## 2021-10-22 LAB — CBC WITH DIFFERENTIAL/PLATELET
Abs Immature Granulocytes: 0.05 10*3/uL (ref 0.00–0.07)
Basophils Absolute: 0.1 10*3/uL (ref 0.0–0.1)
Basophils Relative: 0 %
Eosinophils Absolute: 0.1 10*3/uL (ref 0.0–0.5)
Eosinophils Relative: 1 %
HCT: 43 % (ref 36.0–46.0)
Hemoglobin: 14.2 g/dL (ref 12.0–15.0)
Immature Granulocytes: 0 %
Lymphocytes Relative: 16 %
Lymphs Abs: 2.3 10*3/uL (ref 0.7–4.0)
MCH: 30.3 pg (ref 26.0–34.0)
MCHC: 33 g/dL (ref 30.0–36.0)
MCV: 91.7 fL (ref 80.0–100.0)
Monocytes Absolute: 0.9 10*3/uL (ref 0.1–1.0)
Monocytes Relative: 6 %
Neutro Abs: 11 10*3/uL — ABNORMAL HIGH (ref 1.7–7.7)
Neutrophils Relative %: 77 %
Platelets: 283 10*3/uL (ref 150–400)
RBC: 4.69 MIL/uL (ref 3.87–5.11)
RDW: 12.8 % (ref 11.5–15.5)
WBC: 14.3 10*3/uL — ABNORMAL HIGH (ref 4.0–10.5)
nRBC: 0 % (ref 0.0–0.2)

## 2021-10-22 MED ORDER — MORPHINE SULFATE (PF) 4 MG/ML IV SOLN
4.0000 mg | Freq: Once | INTRAVENOUS | Status: AC
  Administered 2021-10-22: 4 mg via INTRAVENOUS
  Filled 2021-10-22: qty 1

## 2021-10-22 MED ORDER — PROMETHAZINE HCL 25 MG/ML IJ SOLN
12.5000 mg | Freq: Once | INTRAMUSCULAR | Status: AC
  Administered 2021-10-22: 12.5 mg via INTRAVENOUS
  Filled 2021-10-22: qty 12.5

## 2021-10-22 NOTE — ED Notes (Signed)
Pt electing to leave AMA prior to CT scan. This RN went over risks and benefits. MD notified

## 2021-10-22 NOTE — Discharge Instructions (Signed)
You were seen in the ER today for your belly pain.  Your blood work overall is reassuring.  Your symptoms significantly improved with pain medication nausea medication through IV.  While the exact cause of your symptoms was not identified, and you chose not to have a CT scan of your abdomen today, there does not appear to be any emergent problem based on your physical exam and blood work.  Please follow-up closely in the outpatient setting with your primary care provider and return to the ER with any severe symptoms.

## 2021-10-22 NOTE — ED Provider Notes (Signed)
[place] DEPT Provider Note   CSN: [id number] Arrival date & time: 10/21/21  2330     History  Chief Complaint  Patient presents with   Abdominal Pain    Olivia Barker is a 66 y.o. female with history of severe gastroparesis and interstitial cystitis who presents with concern for generalized abdominal pain, bloating, and lower back pain for the last 2 hours after eating chicken for dinner.  Endorses passing some gas and a small bowel movement, endorses nausea but no vomiting.  States she usually requires Phenergan Ativan and morphine to manage her gastroparesis, follows with [facility].  She states that pain is similar to her gastroparesis, however her partner who is at the bedside states that he feels like her symptoms today are quite different than normal.  It is difficult to get a clear description of her pain today but she does deny any urinary symptoms.  I personally reviewed this patient's medical records.  In addition to the above listed history patient has history of chronic back pain, depression, hyperlipidemia, and nephrolithiasis in the past.  HPI     Home Medications Prior to Admission medications   Medication Sig Start Date End Date Taking? Authorizing Provider  ALPRAZolam Duanne Moron) 1 MG tablet TAKE ONE TABLET THREE TIMES DAILY AS NEEDED FOR ANXIETY 11/08/20   Adelene Idler, Helene Kelp T, PA-C  buPROPion (WELLBUTRIN XL) 300 MG 24 hr tablet Take 1 tablet (300 mg total) by mouth daily. 07/04/20   Donnal Moat T, PA-C  busPIRone (BUSPAR) 15 MG tablet 1/3 po bid for 1 week, then 2/3 po bid for 1 week, then 1 po bid. 08/01/20   Adelene Idler, Dorothea Glassman, PA-C  butalbital-acetaminophen-caffeine (FIORICET) 50-325-40 MG tablet Take 1 tablet by mouth as needed. MIGRAINE    [provider]  escitalopram (LEXAPRO) 20 MG tablet Take 1.5 tablets (30 mg total) by mouth daily. 07/04/20   Donnal Moat T, PA-C  Methen-Hyosc-Meth Blue-Na Phos (UROGESIC-BLUE) 81.6 MG TABS Take 1  capsule by mouth as needed. Bladder spasms    [provider]  methocarbamol (ROBAXIN) 500 MG tablet Take 500 mg by mouth every 8 (eight) hours. spasms    [provider]  metoprolol tartrate (LOPRESSOR) 50 MG tablet Take 1 tablet (50 mg total) by mouth 2 (two) times daily. 02/24/21   Fay Records, MD  phentermine 15 MG capsule Take 1 capsule by mouth daily.    [provider]  promethazine (PHENERGAN) 25 MG tablet Take 25 mg by mouth every 6 (six) hours as needed for nausea or vomiting.  11/19/15   [provider]  promethazine (PHENERGAN) 25 MG/ML injection Inject 25 mg into the muscle as needed. nausea 01/17/20   [provider]  rosuvastatin (CRESTOR) 20 MG tablet TAKE 1 TABLET ONCE DAILY. 04/16/20   Fay Records, MD  Semaglutide,0.25 or 0.'5MG'$ /DOS, (OZEMPIC, 0.25 OR 0.5 MG/DOSE,) 2 MG/1.5ML SOPN Inject 0.25 mg into the skin as directed.    [provider]  tamsulosin (FLOMAX) 0.4 MG CAPS capsule Take 0.4 mg by mouth as needed. urinary    [provider]  zolpidem (AMBIEN) 10 MG tablet TAKE 1 TABLET AT BEDTIME AS NEEDED. 10/03/20   Donnal Moat T, PA-C      Allergies    Aspirin, Iodinated contrast media, Iohexol, Nsaids, Macrobid  [nitrofurantoin macrocrystal], and Other    Review of Systems   Review of Systems  Constitutional: Negative.   HENT: Negative.    Respiratory: Negative.  Cardiovascular: Negative.   Gastrointestinal:  Positive for abdominal distention, abdominal pain and nausea.  Genitourinary: Negative.   Musculoskeletal:  Positive for back pain.  Neurological: Negative.   Hematological: Negative.     Physical Exam Updated Vital Signs BP (!) 136/91 (BP Location: Left Arm)   Pulse 100   Temp 98.1 F (36.7 C) (Oral)   Resp 18   Ht '5\' 2"'$  (1.575 m)   Wt 83 kg   SpO2 100%   BMI 33.47 kg/m  Physical Exam Vitals and nursing note reviewed.  Constitutional:      Appearance: She is not ill-appearing or  toxic-appearing.  HENT:     Head: Normocephalic and atraumatic.     Mouth/Throat:     Mouth: Mucous membranes are moist.     Pharynx: No oropharyngeal exudate or posterior oropharyngeal erythema.  Eyes:     General:        Right eye: No discharge.        Left eye: No discharge.     Conjunctiva/sclera: Conjunctivae normal.  Cardiovascular:     Rate and Rhythm: Normal rate and regular rhythm.     Pulses: Normal pulses.     Heart sounds: Normal heart sounds.  Pulmonary:     Effort: Pulmonary effort is normal. No respiratory distress.     Breath sounds: Normal breath sounds. No wheezing or rales.  Abdominal:     General: Bowel sounds are decreased. There is distension.     Palpations: Abdomen is soft. There is no fluid wave, mass or pulsatile mass.     Tenderness: There is generalized abdominal tenderness. There is no right CVA tenderness, left CVA tenderness, guarding or rebound.     Comments: Significantly decreased bowel sounds in the left abdomen, mildly decreased sounds on the right lower quadrant  Musculoskeletal:        General: No deformity.     Cervical back: Neck supple.  Skin:    General: Skin is warm and dry.  Neurological:     Mental Status: She is alert. Mental status is at baseline.  Psychiatric:        Mood and Affect: Mood normal.     ED Results / Procedures / Treatments   Labs (all labs ordered are listed, but only abnormal results are displayed) Labs Reviewed  COMPREHENSIVE METABOLIC PANEL  LIPASE, BLOOD  CBC WITH DIFFERENTIAL/PLATELET  URINALYSIS, ROUTINE W REFLEX MICROSCOPIC    EKG None  Radiology No results found.  Procedures Procedures    Medications Ordered in ED Medications - No data to display  ED Course/ Medical Decision Making/ A&P                           Medical Decision Making 66 year old female presents with concern for abdominal pain and low back pain that started this evening after eating dinner.  History of  gastroparesis.  Hypertensive on intake, vital signs otherwise normal.  Cardiopulmonary exam is normal, abdominal exam is above generally tender to palpation without CVA tenderness or focality on exam.  No lower extremity edema, no pulsatile mass in the abdomen.  Normal blood pressure throughout.  DDX includes but is not limited to bowel obstruction, AAA, mesenteric ischemia, gastroparesis, gastroenteritis, bowel gas, constipation, intra-abdominal infection such as diverticulitis.  Amount and/or Complexity of Data Reviewed Labs: ordered.    Details: CBC with leukocytosis of 14,000 without anemia, CMP with mild hypokalemia of 3.4.  Lipase is normal. Radiology: ordered.  Risk Prescription drug management.   Patient reevaluated after administration of IV antiemetic and analgesia with complete resolution of her pain and significant improvement in her nausea.  She feels her symptoms are most consistent with her gastroparesis and would like to avoid CT imaging at this time.  Repeat abdominal exam is benign, patient is tolerating p.o., and clearly stating that she would like to be discharged at this time.  Given reassuring physical exam and largely reassuring laboratory studies, feel it is reasonable to forego CT imaging today in favor of strict return precautions and close outpatient follow-up.  While the exact etiology of the patient's symptoms remains unclear and emergent source such as bowel obstruction cannot be ruled out, clinical concern for emergent etiology is low.  Patient passing significant amounts of flatus in the ED and tolerating p.o.  Syana voiced understanding of her medical evaluation and treatment plan. Each of their questions answered to their expressed satisfaction.  Return precautions were given.  Patient is well-appearing, stable, and was discharged in good condition.  This chart was dictated using voice recognition software, Dragon. Despite the best efforts of this provider to  proofread and correct errors, errors may still occur which can change documentation meaning.   Final Clinical Impression(s) / ED Diagnoses Final diagnoses:  None    Rx / DC Orders ED Discharge Orders     None         Aura Dials 10/22/21 0321    Quintella Reichert, MD 10/22/21 1920

## 2021-11-03 DIAGNOSIS — F331 Major depressive disorder, recurrent, moderate: Secondary | ICD-10-CM | POA: Diagnosis not present

## 2021-11-03 DIAGNOSIS — F411 Generalized anxiety disorder: Secondary | ICD-10-CM | POA: Diagnosis not present

## 2021-11-26 DIAGNOSIS — F331 Major depressive disorder, recurrent, moderate: Secondary | ICD-10-CM | POA: Diagnosis not present

## 2021-11-26 DIAGNOSIS — F411 Generalized anxiety disorder: Secondary | ICD-10-CM | POA: Diagnosis not present

## 2021-11-27 DIAGNOSIS — R110 Nausea: Secondary | ICD-10-CM | POA: Diagnosis not present

## 2021-11-28 IMAGING — CT CT CARDIAC CORONARY ARTERY CALCIUM SCORE
3 series · 14 of 20 positions shown, 16 images · non-contrast
Comparison: CT abdomen 06/06/2014
COMPARISON: CT abdomen 06/06/2014

Addendum:
EXAM:
OVER-READ INTERPRETATION  CT CHEST

The following report is an over-read performed by radiologist Dr.
Alex Pk Pako Lothusang [REDACTED] on 03/21/2021. This over-read
does not include interpretation of cardiac or coronary anatomy or
pathology. The coronary calcium interpretation by the cardiologist
is attached.
CLINICAL DATA: Cardiovascular Disease Risk stratification
Coronary Calcium Score
TECHNIQUE: A gated, non-contrast computed tomography scan of the heart was
performed using 3mm slice thickness. Axial images were analyzed on a
dedicated workstation. Calcium scoring of the coronary arteries was
performed using the Agatston method.

[Series 2: cascseq 2.0 sa36 (id) (id) · axial · 0.39mm/px · z∈[-187,-117]mm · 4 of 59 slices shown]
[im 12/59  vessel]
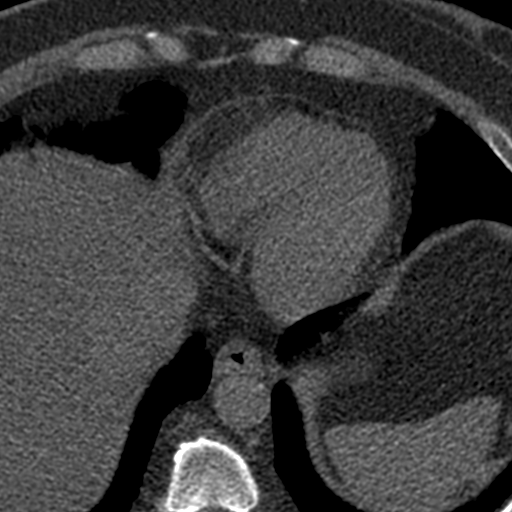
[im 24/59  vessel]
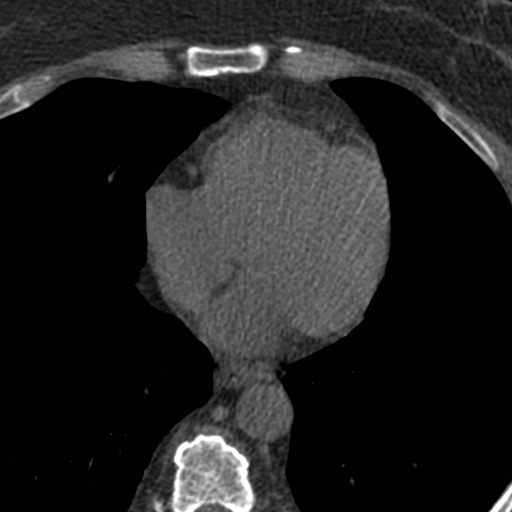
[im 35/59  vessel]
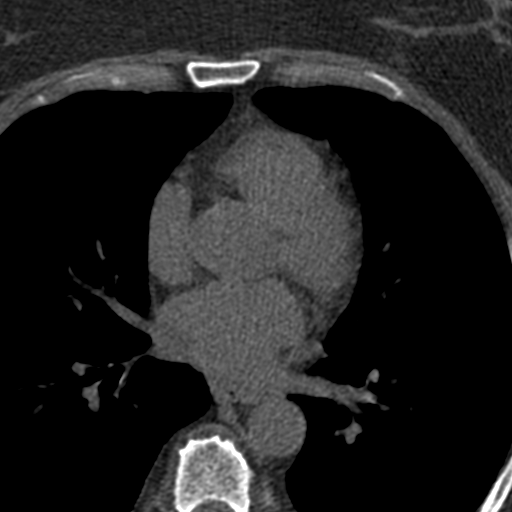
[im 47/59  vessel]
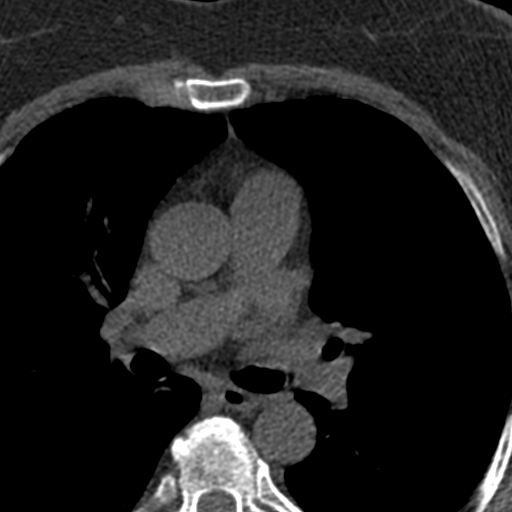

[Series 3: cascseq 2.0 bf37 st · axial · 0.71mm/px · z∈[-191,-113]mm · 5 of 59 slices shown, 7 images]
[im 10/59  vessel]
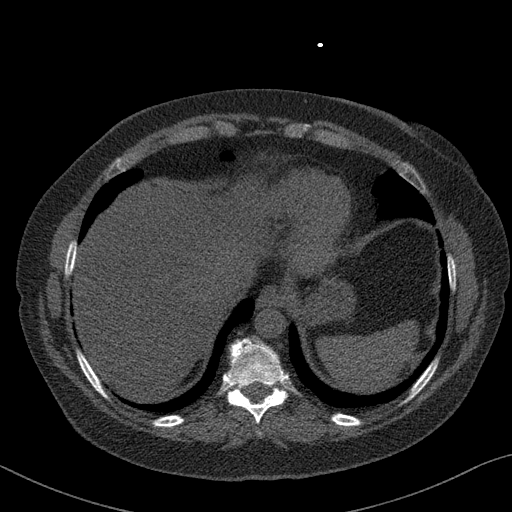
[im 10/59  lung]
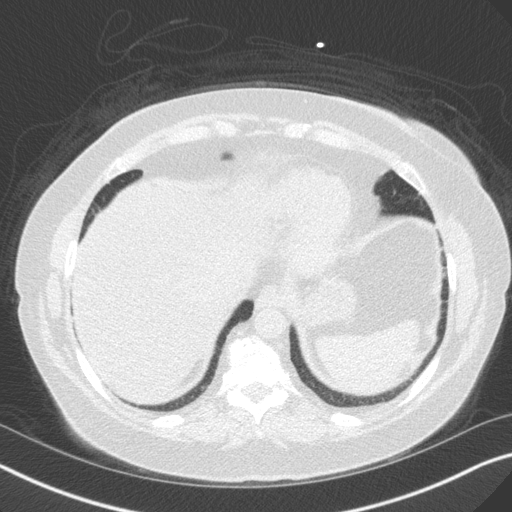
[im 20/59  vessel]
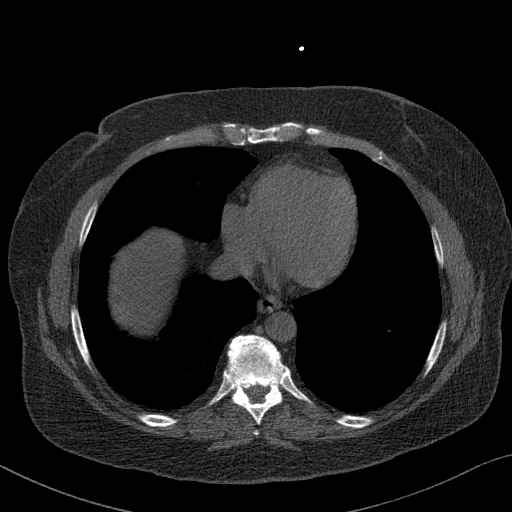
[im 30/59  vessel]
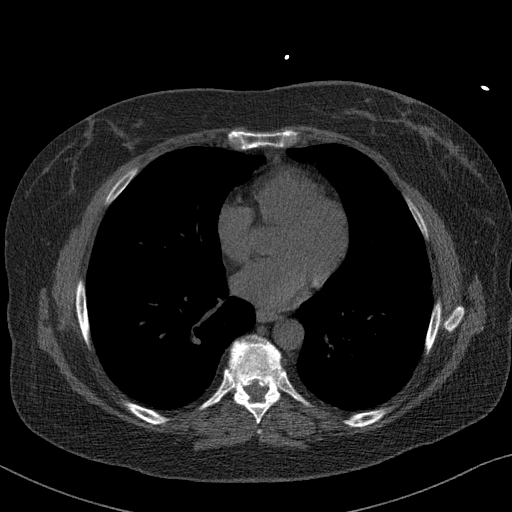
[im 39/59  vessel]
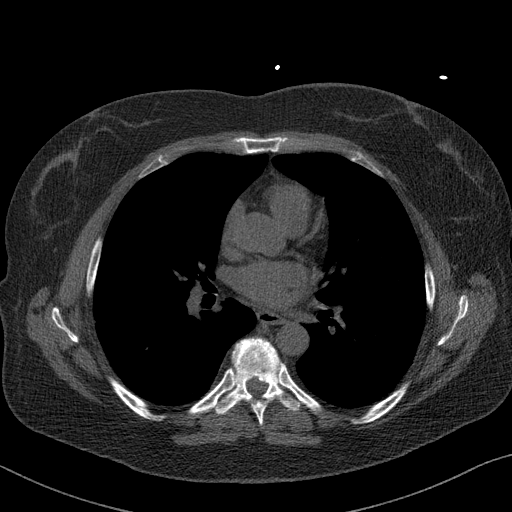
[im 49/59  vessel]
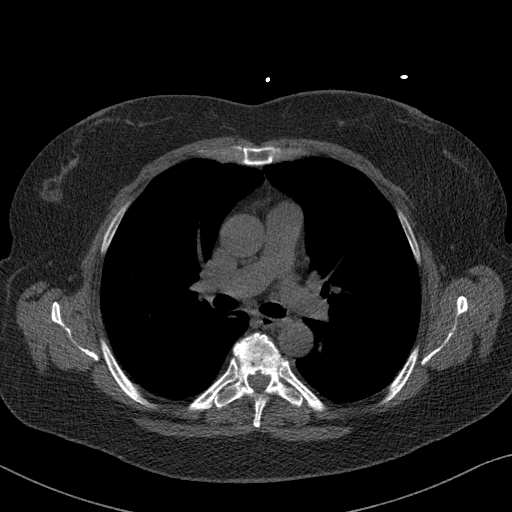
[im 49/59  lung]
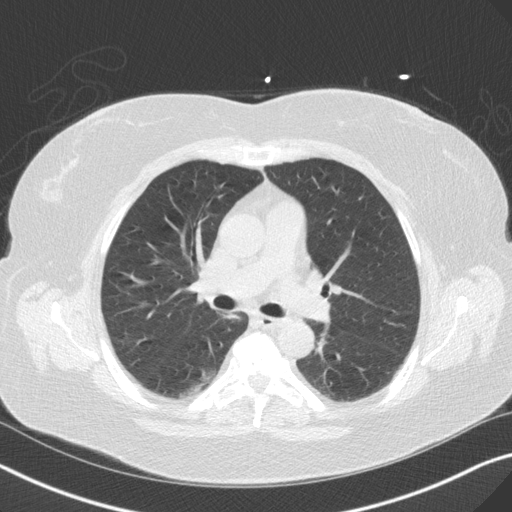

[Series 4: cascseq 2.0 br59 lung · axial · 0.71mm/px · z∈[-191,-113]mm · 5 of 59 slices shown]
[im 10/59  lung]
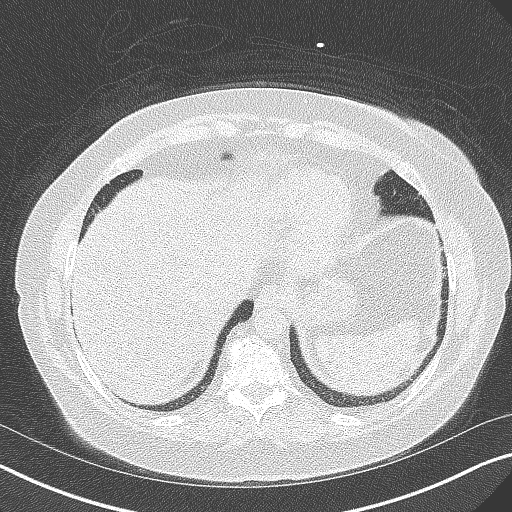
[im 20/59  lung]
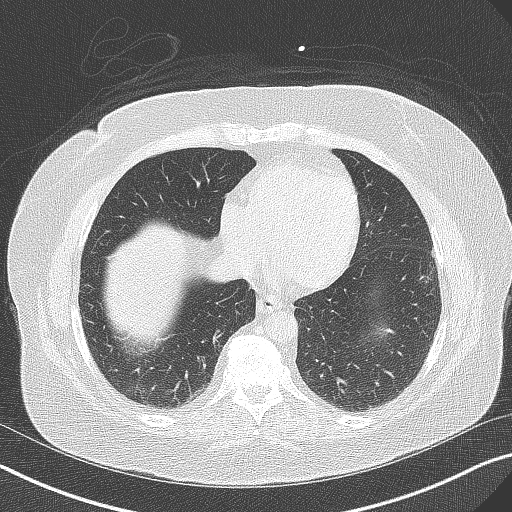
[im 30/59  lung]
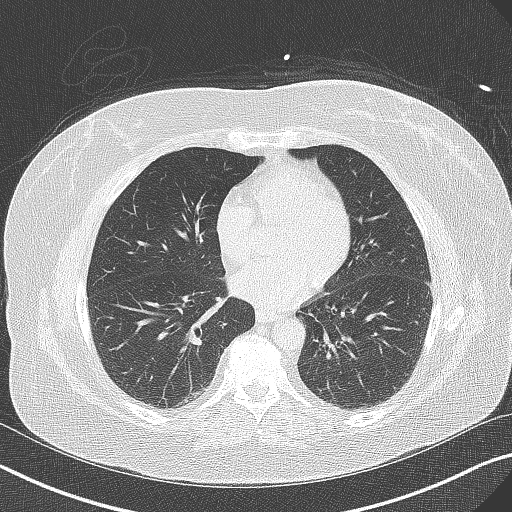
[im 39/59  lung]
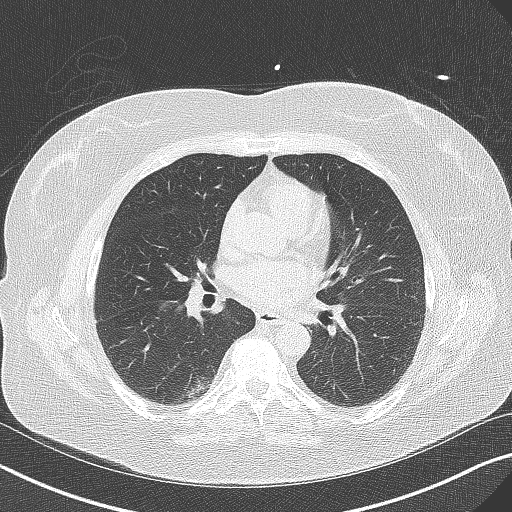
[im 49/59  lung]
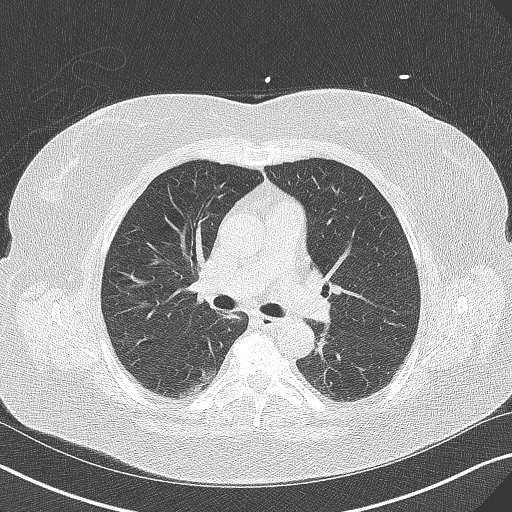

[14 of 20 positions shown; findings below may reference images not displayed]

FINDINGS: Vascular: Normal caliber of the visualized thoracic aorta.

Mediastinum/Nodes: Visualized mediastinal structures are normal.

Lungs/Pleura: Peripheral densities in the medial right lower lobe
and along the lateral left lower lobe. These are nonspecific but
could represent areas of atelectasis or scarring. No large pleural
effusions.

Upper Abdomen: Low-density in the liver is suggestive for steatosis.

Musculoskeletal: No acute bone abnormality.
IMPRESSION: 1. No acute abnormality involving the extracardiac structures.
2. Decreased attenuation in the liver is suggestive for steatosis.
3. Scattered peripheral densities in the lower lungs could represent
small areas of scarring or atelectasis as described.
FINDINGS: Coronary arteries: Normal origins.

Coronary Calcium Score:

Left main: 0

Left anterior descending artery: 0

Left circumflex artery: 0

Right coronary artery: 0

Total: 0

Percentile: 0

Pericardium: Normal.

Aorta: Normal caliber of ascending aorta. Scattered aortic
atherosclerosis noted.

Non-cardiac: See separate report from [REDACTED].
IMPRESSION: Coronary calcium score of 0. This was 0 percentile for age-, race-,
and sex-matched controls.



If CAC=0, it is reasonable to withhold statin therapy and reassess
in 5 to 10 years, as long as higher risk conditions are absent
(diabetes mellitus, family history of premature CHD in first degree
relatives (males <55 years; females <65 years), cigarette smoking,
or LDL >=190 mg/dL).

If CAC is 1 to 99, it is reasonable to initiate statin therapy for
patients >=55 years of age.

If CAC is >=100 or >=75th percentile, it is reasonable to initiate
statin therapy at any age.

Cardiology referral should be considered for patients with CAC
scores >=400 or >=75th percentile.

*2499 AHA/ACC/AACVPR/AAPA/ABC/NAMJESNIK/FOZIAH/AYMAN/[place]/DRE/DR-HANI/DARAB
Guideline on the Management of Blood Cholesterol: A Report of the
American College of Cardiology/American Heart Association Task Force
on Clinical Practice Guidelines. J Am Coll Cardiol.
7502;73(24):7474-7873.

*** End of Addendum ***
EXAM:
OVER-READ INTERPRETATION  CT CHEST

The following report is an over-read performed by radiologist Dr.
Alex Pk Pako Lothusang [REDACTED] on 03/21/2021. This over-read
does not include interpretation of cardiac or coronary anatomy or
pathology. The coronary calcium interpretation by the cardiologist
is attached.
FINDINGS: Vascular: Normal caliber of the visualized thoracic aorta.

Mediastinum/Nodes: Visualized mediastinal structures are normal.

Lungs/Pleura: Peripheral densities in the medial right lower lobe
and along the lateral left lower lobe. These are nonspecific but
could represent areas of atelectasis or scarring. No large pleural
effusions.

Upper Abdomen: Low-density in the liver is suggestive for steatosis.

Musculoskeletal: No acute bone abnormality.
IMPRESSION: 1. No acute abnormality involving the extracardiac structures.
2. Decreased attenuation in the liver is suggestive for steatosis.
3. Scattered peripheral densities in the lower lungs could represent
small areas of scarring or atelectasis as described.

## 2021-12-02 DIAGNOSIS — R29890 Loss of height: Secondary | ICD-10-CM | POA: Diagnosis not present

## 2021-12-02 DIAGNOSIS — M8588 Other specified disorders of bone density and structure, other site: Secondary | ICD-10-CM | POA: Diagnosis not present

## 2021-12-02 DIAGNOSIS — Z01419 Encounter for gynecological examination (general) (routine) without abnormal findings: Secondary | ICD-10-CM | POA: Diagnosis not present

## 2021-12-02 DIAGNOSIS — Z6839 Body mass index (BMI) 39.0-39.9, adult: Secondary | ICD-10-CM | POA: Diagnosis not present

## 2021-12-02 DIAGNOSIS — R350 Frequency of micturition: Secondary | ICD-10-CM | POA: Diagnosis not present

## 2021-12-02 DIAGNOSIS — N958 Other specified menopausal and perimenopausal disorders: Secondary | ICD-10-CM | POA: Diagnosis not present

## 2021-12-02 DIAGNOSIS — R635 Abnormal weight gain: Secondary | ICD-10-CM | POA: Diagnosis not present

## 2021-12-21 NOTE — Progress Notes (Deleted)
Office Visit    Patient Name: Olivia Barker Date of Encounter: 12/21/2021  PCP:  Olivia Barker, [facility]  Cardiologist:  Olivia Carnes, MD  Advanced Practice Provider:  No care team member to display Electrophysiologist:  None   Chief Complaint    Olivia Barker is a 66 y.o. female past medical history significant for hypertension, hyperlipidemia, family history of CAD, and gastroparesis presents today for work-up for dyspnea on exertion.  She had a stress test which was low risk back in 2018.  Echocardiogram 2018 showed normal LVEF.  She was seen in the clinic by Dr. Harrington Barker 10/22 and she was having a bad week.  Her blood pressure was still high.  She also had had an attack of her gastroparesis which included intractable vomiting the night before.  She denied chest pain at that time.  She was in the ER back in June for her severe gastroparesis and interstitial cystitis.  Today, she ***  Past Medical History    Past Medical History:  Diagnosis Date   Bladder pain    Chronic back pain    secondary to IC   Congenital absence of ovary    per pt unknown which side--    Depression    GAD (generalized anxiety disorder)    Gastroparesis    GERD (gastroesophageal reflux disease)    History of adenomatous polyp of colon    2010 tubular adenoma and hyperplastic   History of echocardiogram    Echo 7/18:  EF 55-60, no RWMA   History of gastric polyp    2014 hyperplastic   History of gastritis    History of nuclear stress test    Myoview 7/18: EF 71, no ischemia, low risk   History of sepsis    urosepsis w/ acute pyelonephritis 09/ 2015   Hyperlipidemia    Interstitial cystitis 1992   Nephrolithiasis    multiple bilateral non-obstructive per abd. xray 11-21-2015   Sensation of pressure in bladder area    Tinnitus    Fluid in ears--  continally   Past Surgical History:  Procedure Laterality Date   ABDOMINAL HYSTERECTOMY  1998   ABDOMINOPLASTY   06/02/2001   BREAST REDUCTION SURGERY Bilateral 2008   CARDIOVASCULAR STRESS TEST  02-25-2012  dr Olivia Barker   normal lexiscan nuclear study w/ no ischemia/  normal LV function and wall function , ef 79%   CARDIOVASCULAR STRESS TEST  02/25/2012   normal nuclear study w/ no ischemia/  normal LV function and wall motion , ef 79%   CESAREAN SECTION  06-12-1986   CHOLECYSTECTOMY  apr 2014   CYSTO WITH HYDRODISTENSION N/A 03/03/2016   Procedure: CYSTOSCOPY/HYDRODISTENSION AND INSTILL MARCAINE AND PYRIDIUM;  Surgeon: Olivia Seal, MD;  Location: [facility];  Service: Urology;  Laterality: N/A;   CYSTO/ HYDRODISTENTION/ INSTILLATION THERAPY  last one early 1990's   EUS N/A 06/29/2012   Procedure: ESOPHAGEAL ENDOSCOPIC ULTRASOUND (EUS) RADIAL;  Surgeon: Olivia Silence, MD;  Location: [facility];  Service: Endoscopy;  Laterality: N/A;   EUS N/A 10/04/2013   Procedure: ESOPHAGEAL ENDOSCOPIC ULTRASOUND (EUS) RADIAL;  Surgeon: Olivia Silence, MD;  Location: [facility];  Service: Endoscopy;  Laterality: N/A;   EXTRACORPOREAL SHOCK WAVE LITHOTRIPSY Right 11/21/2015   ORIF RIGHT 5TH METATARSAL FX  02/04/2001    Allergies  Allergies  Allergen Reactions   Aspirin Anaphylaxis   Iodinated Contrast Media Anaphylaxis   Iohexol Anaphylaxis    During  ivp in 1993 pt's throat began to swell w/ anaphylaxis. No studies have been done w/ 13 hr prep//a.calhoun During ivp in 1993 pt's throat began to swell w/ anaphylaxis. No studies have been done w/ 13 hr prep//a.calhoun    Nsaids Anaphylaxis   Macrobid  [Nitrofurantoin Macrocrystal] Itching and Swelling    Eye sweliing     EKGs/Labs/Other Studies Reviewed:   The following studies were reviewed today:  CT coronary 03/2021  ADDENDUM REPORT: 03/21/2021 17:54   CLINICAL DATA:  Cardiovascular Disease Risk stratification   EXAM: Coronary Calcium Score   TECHNIQUE: A gated, non-contrast computed tomography scan of the heart was performed  using 20m slice thickness. Axial images were analyzed on a dedicated workstation. Calcium scoring of the coronary arteries was performed using the Agatston method.   FINDINGS: Coronary arteries: Normal origins.   Coronary Calcium Score:   Left main: 0   Left anterior descending artery: 0   Left circumflex artery: 0   Right coronary artery: 0   Total: 0   Percentile: 0   Pericardium: Normal.   Aorta: Normal caliber of ascending aorta. Scattered aortic atherosclerosis noted.   [facility]cardiac: [facility]   IMPRESSION: Coronary calcium score of 0. This was 0 percentile for age-, race-, and sex-matched controls.   RECOMMENDATIONS: Coronary artery calcium (CAC) score is a strong predictor of incident coronary heart disease (CHD) and provides predictive information beyond traditional risk factors. CAC scoring is reasonable to use in the decision to withhold, postpone, or initiate statin therapy in intermediate-risk or selected borderline-risk asymptomatic adults (age 65-75years and LDL-C >=70 to <190 mg/dL) who do not have diabetes or established atherosclerotic cardiovascular disease (ASCVD).* In intermediate-risk (10-year ASCVD risk >=7.5% to <20%) adults or selected borderline-risk (10-year ASCVD risk >=5% to <7.5%) adults in whom a CAC score is measured for the purpose of making a treatment decision the following recommendations have been made:  EKG:  EKG is *** ordered today.  The ekg ordered today demonstrates ***  Recent Labs: 10/22/2021: ALT 26; BUN 13; Creatinine, Ser 0.88; Hemoglobin 14.2; Platelets 283; Potassium 3.4; Sodium 139  Recent Lipid Panel    Component Value Date/Time   CHOL 183 06/17/2015 1027   CHOL 158 09/18/2013 0921   TRIG 237 (H) 06/17/2015 1027   TRIG 214 (H) 09/18/2013 0921   HDL 51 06/17/2015 1027   HDL 40 09/18/2013 0921   CHOLHDL 3.6 06/17/2015 1027   CHOLHDL 4 09/04/2009 0758   VLDL 38.6 09/04/2009  0758   LDLCALC 85 06/17/2015 1027   LDLCALC 75 09/18/2013 0921   LDLDIRECT 214.6 07/02/2009 0956    Risk Assessment/Calculations:  {Does this patient have ATRIAL FIBRILLATION?:[phone]}  Home Medications   No outpatient medications have been marked as taking for the 12/22/21 encounter (Appointment) with CElgie Collard PA-C.     Review of Systems   ***   All other systems reviewed and are otherwise negative except as noted above.  Physical Exam    VS:  There were no vitals taken for this visit. , BMI There is no height or weight on file to calculate BMI.  Wt Readings from Last 3 Encounters:  10/21/21 182 lb 15.7 oz (83 kg)  02/24/21 183 lb (83 kg)  03/21/20 172 lb 3.2 oz (78.1 kg)     GEN: Well nourished, well developed, in no acute distress. HEENT: normal. Neck: Supple, no JVD, carotid bruits, or masses. Cardiac: ***RRR, no murmurs, rubs, or gallops. No clubbing,  cyanosis, edema.  ***Radials/PT 2+ and equal bilaterally.  Respiratory:  ***Respirations regular and unlabored, clear to auscultation bilaterally. GI: Soft, nontender, nondistended. MS: No deformity or atrophy. Skin: Warm and dry, no rash. Neuro:  Strength and sensation are intact. Psych: Normal affect.  Assessment & Plan    DOE -Reassuring coronary CT scan 03/21/2021 -Consider echocardiogram  2. Hypertension 3. Tachycardia 4. Hyperlipidemia 5. Gastroparesis    No BP recorded.  {Refresh Note OR Click here to enter BP  :1}***      Disposition: Follow up {follow up:15908} with Olivia Carnes, MD or APP.  Signed, Elgie Collard, PA-C 12/21/2021, 4:22 PM [facility]

## 2021-12-22 ENCOUNTER — Ambulatory Visit: Payer: BC Managed Care – PPO | Admitting: Physician Assistant

## 2021-12-22 DIAGNOSIS — I10 Essential (primary) hypertension: Secondary | ICD-10-CM

## 2021-12-22 DIAGNOSIS — R0609 Other forms of dyspnea: Secondary | ICD-10-CM

## 2021-12-22 DIAGNOSIS — E782 Mixed hyperlipidemia: Secondary | ICD-10-CM

## 2021-12-22 DIAGNOSIS — K3184 Gastroparesis: Secondary | ICD-10-CM

## 2022-01-07 ENCOUNTER — Ambulatory Visit: Payer: Medicare Other | Attending: Nurse Practitioner | Admitting: Nurse Practitioner

## 2022-01-07 ENCOUNTER — Encounter: Payer: Self-pay | Admitting: Nurse Practitioner

## 2022-01-07 VITALS — BP 112/68 | HR 108 | Ht 62.0 in

## 2022-01-07 DIAGNOSIS — E785 Hyperlipidemia, unspecified: Secondary | ICD-10-CM | POA: Diagnosis present

## 2022-01-07 DIAGNOSIS — E119 Type 2 diabetes mellitus without complications: Secondary | ICD-10-CM | POA: Diagnosis present

## 2022-01-07 DIAGNOSIS — E663 Overweight: Secondary | ICD-10-CM | POA: Insufficient documentation

## 2022-01-07 DIAGNOSIS — R0609 Other forms of dyspnea: Secondary | ICD-10-CM | POA: Insufficient documentation

## 2022-01-07 DIAGNOSIS — R000 Tachycardia, unspecified: Secondary | ICD-10-CM

## 2022-01-07 DIAGNOSIS — I10 Essential (primary) hypertension: Secondary | ICD-10-CM | POA: Insufficient documentation

## 2022-01-07 DIAGNOSIS — Z79899 Other long term (current) drug therapy: Secondary | ICD-10-CM | POA: Diagnosis present

## 2022-01-07 MED ORDER — NEBIVOLOL HCL 20 MG PO TABS: 20.0000 mg | ORAL_TABLET | Freq: Every day | ORAL | 9 refills | Status: AC
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
## 2022-01-07 NOTE — Patient Instructions (Signed)
Medication Instructions:   DISCONTINUE Lopressor  START Nebivolol one (1) tablet by mouth ( 20 mg) daily.  *If you need a refill on your cardiac medications before your next appointment, please call your pharmacy*   Lab Work:  None ordered.  If you have labs (blood work) drawn today and your tests are completely normal, you will receive your results only by: [place] (if you have MyChart) OR A paper copy in the mail If you have any lab test that is abnormal or we need to change your treatment, we will call you to review the results.   Testing/Procedures:  Your physician has requested that you have an echocardiogram. Echocardiography is a painless test that uses sound waves to create images of your heart. It provides your doctor with information about the size and shape of your heart and how well your heart's chambers and valves are working. This procedure takes approximately one hour. There are no restrictions for this procedure.    Follow-Up: At [facility], you and your health needs are our priority.  As part of our continuing mission to provide you with exceptional heart care, we have created designated Provider Care Teams.  These Care Teams include your primary Cardiologist (physician) and Advanced Practice Providers (APPs -  Physician Assistants and Nurse Practitioners) who all work together to provide you with the care you need, when you need it.  We recommend signing up for the patient portal called "MyChart".  Sign up information is provided on this After Visit Summary.  MyChart is used to connect with patients for Virtual Visits (Telemedicine).  Patients are able to view lab/test results, encounter notes, upcoming appointments, etc.  Non-urgent messages can be sent to your provider as well.   To learn more about what you can do with MyChart, go to [email or web address].    Your next appointment:   6 month(s)  The format for your next appointment:   In  Person  Provider:   Dorris Carnes, MD     Other Instructions  You have been referred to Pharmacist for weight management.   Adopting a Healthy Lifestyle.   Weight: Know what a healthy weight is for you (roughly BMI <25) and aim to maintain this. You can calculate your body mass index on your smart phone  Diet: Aim for 7+ servings of fruits and vegetables daily Limit animal fats in diet for cholesterol and heart health - choose grass fed whenever available Avoid highly processed foods (fast food burgers, tacos, fried chicken, pizza, hot dogs, french fries)  Saturated fat comes in the form of butter, lard, coconut oil, margarine, partially hydrogenated oils, and fat in meat. These increase your risk of cardiovascular disease.  Use healthy plant oils, such as olive, canola, soy, corn, sunflower and peanut.  Whole foods such as fruits, vegetables and whole grains have fiber  Men need > 38 grams of fiber per day Women need > 25 grams of fiber per day  Load up on vegetables and fruits - one-half of your plate: Aim for color and variety, and remember that potatoes dont count. Go for whole grains - one-quarter of your plate: Whole wheat, barley, wheat berries, quinoa, oats, brown rice, and foods made with them. If you want pasta, go with whole wheat pasta. Protein power - one-quarter of your plate: Fish, chicken, beans, and nuts are all healthy, versatile protein sources. Limit red meat. You need carbohydrates for energy! The type of carbohydrate is more important than the amount.  Choose carbohydrates such as vegetables, fruits, whole grains, beans, and nuts in the place of white rice, white pasta, potatoes (baked or fried), macaroni and cheese, cakes, cookies, and donuts.  If youre thirsty, drink water. Coffee and tea are good in moderation, but skip sugary drinks and limit milk and dairy products to one or two daily servings. Keep sugar intake at 6 teaspoons or 24 grams or LESS        Exercise: Aim for 150 min of moderate intensity exercise weekly for heart health, and weights twice weekly for bone health Stay active - any steps are better than no steps! Aim for 7-9 hours of sleep daily         Important Information About Sugar

## 2022-01-21 ENCOUNTER — Ambulatory Visit (HOSPITAL_COMMUNITY): Payer: BC Managed Care – PPO

## 2022-01-22 ENCOUNTER — Ambulatory Visit (HOSPITAL_COMMUNITY): Payer: BC Managed Care – PPO | Attending: Cardiology

## 2022-01-22 ENCOUNTER — Encounter (HOSPITAL_COMMUNITY): Payer: Self-pay | Admitting: Nurse Practitioner

## 2022-01-22 ENCOUNTER — Encounter: Payer: Self-pay | Admitting: Cardiology

## 2022-01-22 ENCOUNTER — Encounter (HOSPITAL_COMMUNITY): Payer: Self-pay

## 2022-01-22 NOTE — Progress Notes (Unsigned)
Patient ID: Olivia Barker, female   DOB: 12/06/1955, 67 y.o.   MRN: [id number]  Verified appointment "no show" status with Rodena Piety at 7:33am.

## 2022-01-26 ENCOUNTER — Other Ambulatory Visit: Payer: Self-pay | Admitting: Internal Medicine

## 2022-01-27 MED ORDER — ROSUVASTATIN CALCIUM 20 MG PO TABS: 20.0000 mg | ORAL_TABLET | Freq: Every day | ORAL | 3 refills | Status: DC

## 2022-02-02 ENCOUNTER — Other Ambulatory Visit (HOSPITAL_COMMUNITY): Payer: BC Managed Care – PPO

## 2022-02-04 ENCOUNTER — Other Ambulatory Visit (HOSPITAL_COMMUNITY): Payer: BC Managed Care – PPO

## 2022-02-10 ENCOUNTER — Ambulatory Visit (HOSPITAL_BASED_OUTPATIENT_CLINIC_OR_DEPARTMENT_OTHER): Payer: Medicare Other

## 2022-02-10 ENCOUNTER — Ambulatory Visit: Payer: Medicare Other | Attending: Cardiology | Admitting: Pharmacist

## 2022-02-10 VITALS — Wt 179.2 lb

## 2022-02-10 DIAGNOSIS — Z79899 Other long term (current) drug therapy: Secondary | ICD-10-CM | POA: Insufficient documentation

## 2022-02-10 DIAGNOSIS — E663 Overweight: Secondary | ICD-10-CM | POA: Diagnosis present

## 2022-02-10 DIAGNOSIS — R000 Tachycardia, unspecified: Secondary | ICD-10-CM | POA: Insufficient documentation

## 2022-02-10 DIAGNOSIS — R0609 Other forms of dyspnea: Secondary | ICD-10-CM | POA: Diagnosis not present

## 2022-02-10 DIAGNOSIS — I10 Essential (primary) hypertension: Secondary | ICD-10-CM

## 2022-02-10 LAB — ECHOCARDIOGRAM COMPLETE
Area-P 1/2: 5.06 cm2
S' Lateral: 2.9 cm
Weight: 2867.2 oz

## 2022-02-10 NOTE — Patient Instructions (Signed)
Tatic nutrition- can follow on instagram M2 performance nutrtion CGM nutrition  Pod Cast The consistency project with [employer] The [employer] show

## 2022-02-10 NOTE — Progress Notes (Signed)
Patient ID: MCKYNZI CAMMON                 DOB: [birth year]                    MRN: [id number]     HPI: Olivia Barker is a 66 y.o. female patient referred to pharmacy clinic by Christen Bame, NP to initiate weight loss therapy with GLP1-RA. PMH is significant for obesity, pre-diabetes, HTN, hyperlipidemia, family history of CAD and gastroparesis. Most recent BMI 32.78. Was previously on ozempic, but discontinued it due to personal reason. She has since resumed Ozempic. She resumed at the '1mg'$  dose. Has severe gastroparesis so have nausea at baseline. She is on medicare now. Paid cash for the 1st fill of Ozempic. Hasn't gotten her partD/advantage card yet. Knows it through [place] [facility].   Lost weight when she was on Ozempic for a few months last year. Struggles with her gastroparesis. Doesn't eat a lot of food because of it. Takes a lot of medications for nausea. Swims for an hour 3-4 days a week. Makes sure she walks on the days she doesn't swim. Also does upper body/core exercises.  Current weight management medications: ozempic '1mg'$  weekly  Previously tried meds: ozempic  Baseline weight/BMI: 179lb/32.78  Insurance payor:  medicare  Diet:  -Breakfast: eggs or nothing -Lunch:1/2 tuna fish sandwich, soup -Dinner: soup, chicken, broccoli -Snacks: apple w/ PB, peach, banana -Drinks: water, sweet tea every now and then, diet   Exercise: swim 3-4 times per week (1hr), weight lift daily upper body, walk, core  Family History:  Family History  Problem Relation Age of Onset   Dementia Mother    Diabetes Mother    Heart disease Father    Heart attack Father    Suicidality Paternal Grandmother    Dementia Maternal Grandmother    Gout Son    Diverticulosis Son     Social History: no alcohol, no tobacco,   Labs: Lab Results  Component Value Date   HGBA1C 5.9 (H) 06/17/2015    Wt Readings from Last 1 Encounters:  10/21/21 182 lb 15.7 oz (83 kg)    BP Readings from Last 1 Encounters:   01/07/22 112/68   Pulse Readings from Last 1 Encounters:  01/07/22 (!) 108       Component Value Date/Time   CHOL 183 06/17/2015 1027   CHOL 158 09/18/2013 0921   TRIG 237 (H) 06/17/2015 1027   TRIG 214 (H) 09/18/2013 0921   HDL 51 06/17/2015 1027   HDL 40 09/18/2013 0921   CHOLHDL 3.6 06/17/2015 1027   CHOLHDL 4 09/04/2009 0758   VLDL 38.6 09/04/2009 0758   LDLCALC 85 06/17/2015 1027   [place] 75 09/18/2013 0921   LDLDIRECT 214.6 07/02/2009 0956    Past Medical History:  Diagnosis Date   Bladder pain    Chronic back pain    secondary to IC   Congenital absence of ovary    per pt unknown which side--    Depression    GAD (generalized anxiety disorder)    Gastroparesis    GERD (gastroesophageal reflux disease)    History of adenomatous polyp of colon    2010 tubular adenoma and hyperplastic   History of echocardiogram    Echo 7/18:  EF 55-60, no RWMA   History of gastric polyp    2014 hyperplastic   History of gastritis    History of nuclear stress test    Myoview 7/18: EF 71,  no ischemia, low risk   History of sepsis    urosepsis w/ acute pyelonephritis 09/ 2015   Hyperlipidemia    Interstitial cystitis 1992   Nephrolithiasis    multiple bilateral non-obstructive per abd. xray 11-21-2015   Sensation of pressure in bladder area    Tinnitus    Fluid in ears--  continally    Current Outpatient Medications on File Prior to Visit  Medication Sig Dispense Refill   ALPRAZolam (XANAX) 1 MG tablet TAKE ONE TABLET THREE TIMES DAILY AS NEEDED FOR ANXIETY (Patient taking differently: Take 1 mg by mouth 3 (three) times daily as needed for anxiety.) 30 tablet 0   buPROPion (WELLBUTRIN XL) 150 MG 24 hr tablet Take 150 mg by mouth every morning.     butalbital-acetaminophen-caffeine (FIORICET) 50-325-40 MG tablet Take 1 tablet by mouth as needed. MIGRAINE     dronabinol (MARINOL) 5 MG capsule Take 5 mg by mouth 2 (two) times daily.     lamoTRIgine (LAMICTAL) 100 MG tablet  Take 100 mg by mouth at bedtime.     Nebivolol HCl (BYSTOLIC) 20 MG TABS Take 1 tablet (20 mg total) by mouth daily. 30 tablet 9   promethazine (PHENERGAN) 25 MG tablet Take 25 mg by mouth every 6 (six) hours as needed for nausea or vomiting.   0   rosuvastatin (CRESTOR) 20 MG tablet Take 1 tablet (20 mg total) by mouth daily. 90 tablet 3   zolpidem (AMBIEN) 10 MG tablet TAKE 1 TABLET AT BEDTIME AS NEEDED. (Patient taking differently: Take 10 mg by mouth at bedtime as needed for sleep.) 30 tablet 1   No current facility-administered medications on file prior to visit.    Allergies  Allergen Reactions   Aspirin Anaphylaxis   Iodinated Contrast Media Anaphylaxis   Iohexol Anaphylaxis    During ivp in 1993 pt's throat began to swell w/ anaphylaxis. No studies have been done w/ 13 hr prep//a.calhoun During ivp in 1993 pt's throat began to swell w/ anaphylaxis. No studies have been done w/ 13 hr prep//a.calhoun    Nsaids Anaphylaxis   Macrobid  [Nitrofurantoin Macrocrystal] Itching and Swelling    Eye sweliing     Assessment/Plan:  1. Pre- Diabetes/Weight loss - Patient is prediabetic. We discussed that her insurance may not pay for Ozempic for pre-diabetes. She will get me her insurance information and I will do PA, but Im not confident that it will be approved. I did suggest she look into a nutrition company that could help her design/count her macros. It is unfortunate that her gastroparesis does make it difficult for her to eat higher fiber foods. I also suggested that she increase the intensity of her exercise and add in more heavy strength training.   Thank you,  Ramond Dial, Pharm.D, BCPS, CPP [facility] [address] [place], [place] [address]  Phone: [phone]; Fax: [phone]

## 2022-03-10 ENCOUNTER — Encounter (INDEPENDENT_AMBULATORY_CARE_PROVIDER_SITE_OTHER): Payer: Self-pay

## 2022-04-09 ENCOUNTER — Ambulatory Visit: Payer: BC Managed Care – PPO | Admitting: Internal Medicine

## 2022-05-26 IMAGING — CR DG CHEST 2V
2 series · 2 of 2 positions shown · non-contrast
Comparison: January 14, 2014

CLINICAL DATA: Night sweats

EXAM:
CHEST - 2 VIEW

[w chest pa]
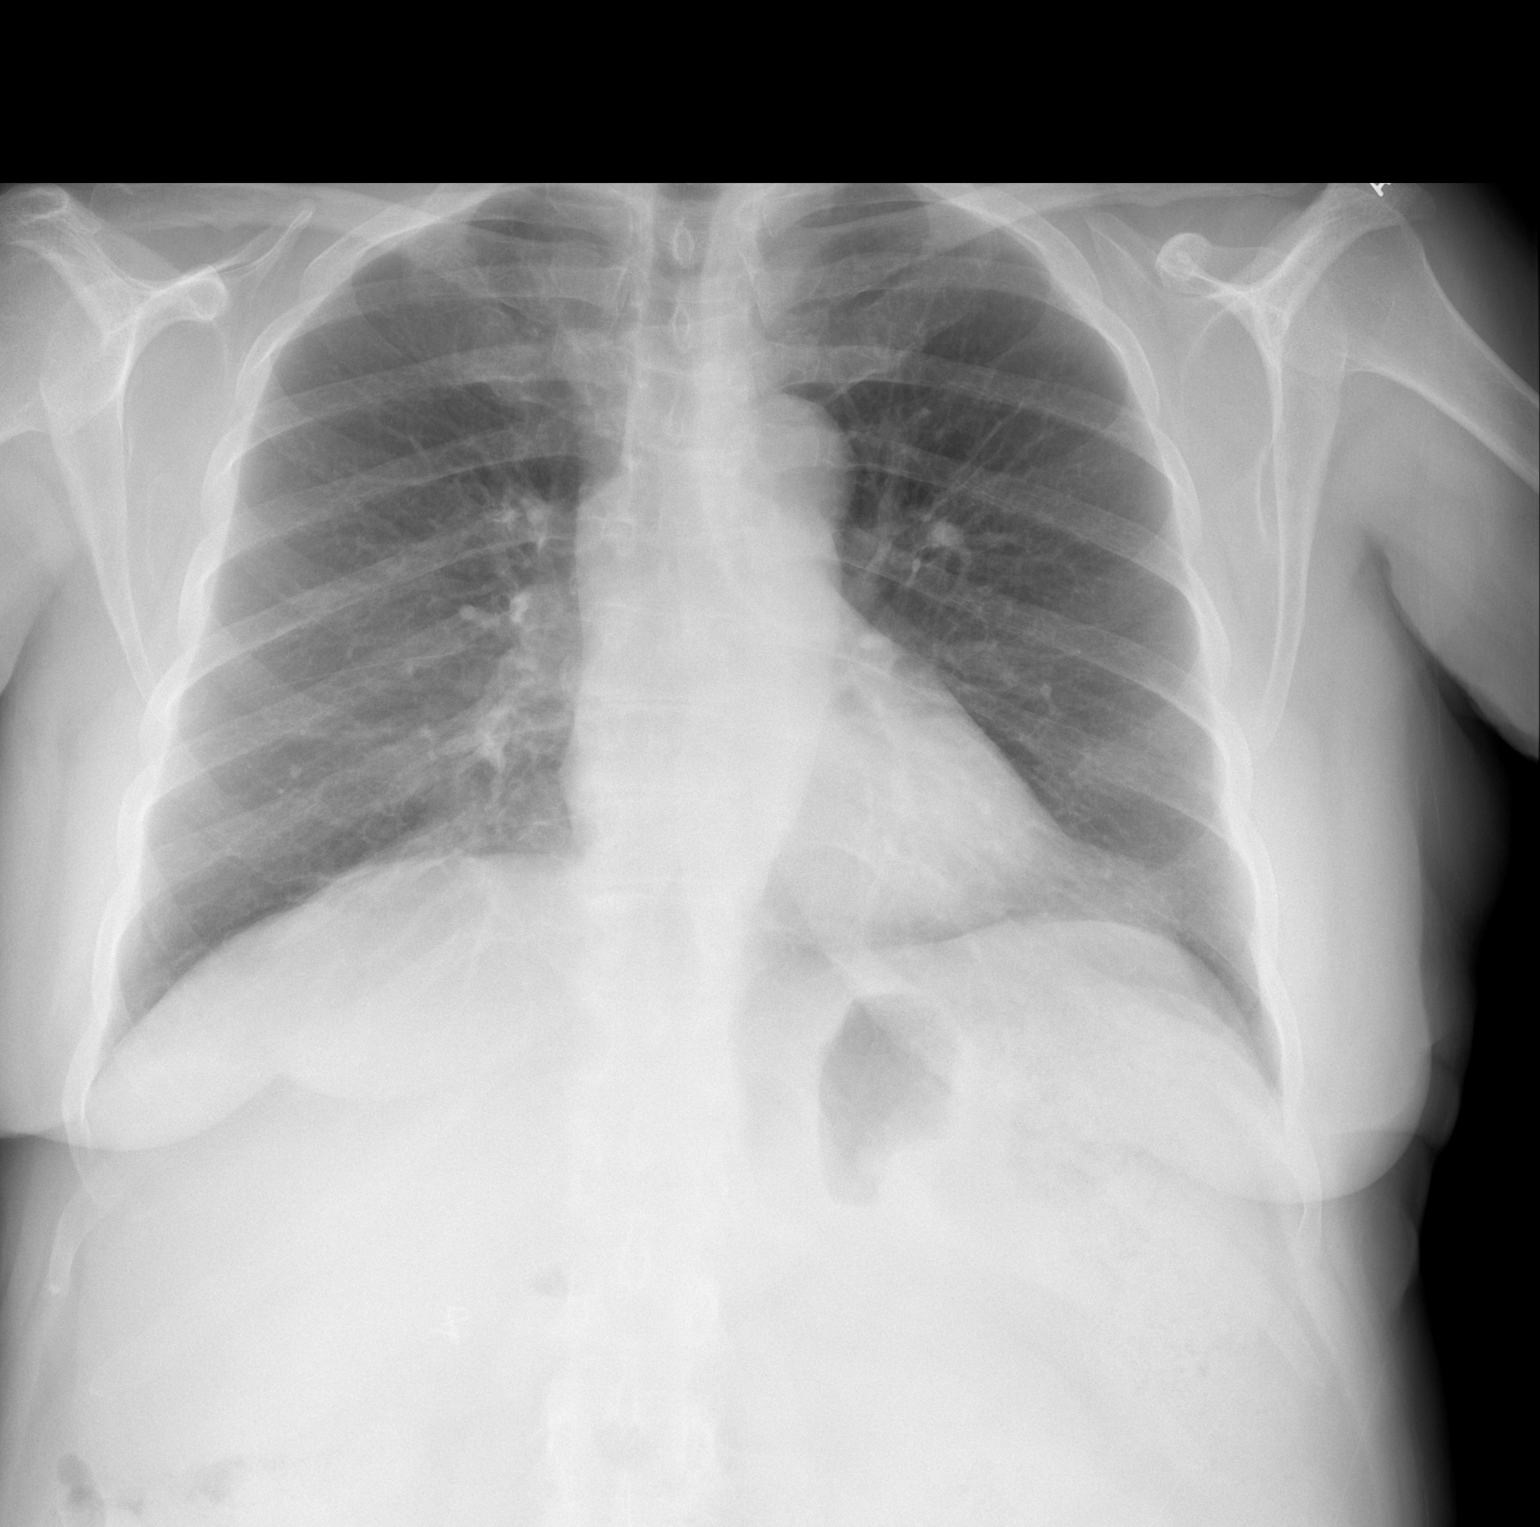

[w chest lat]
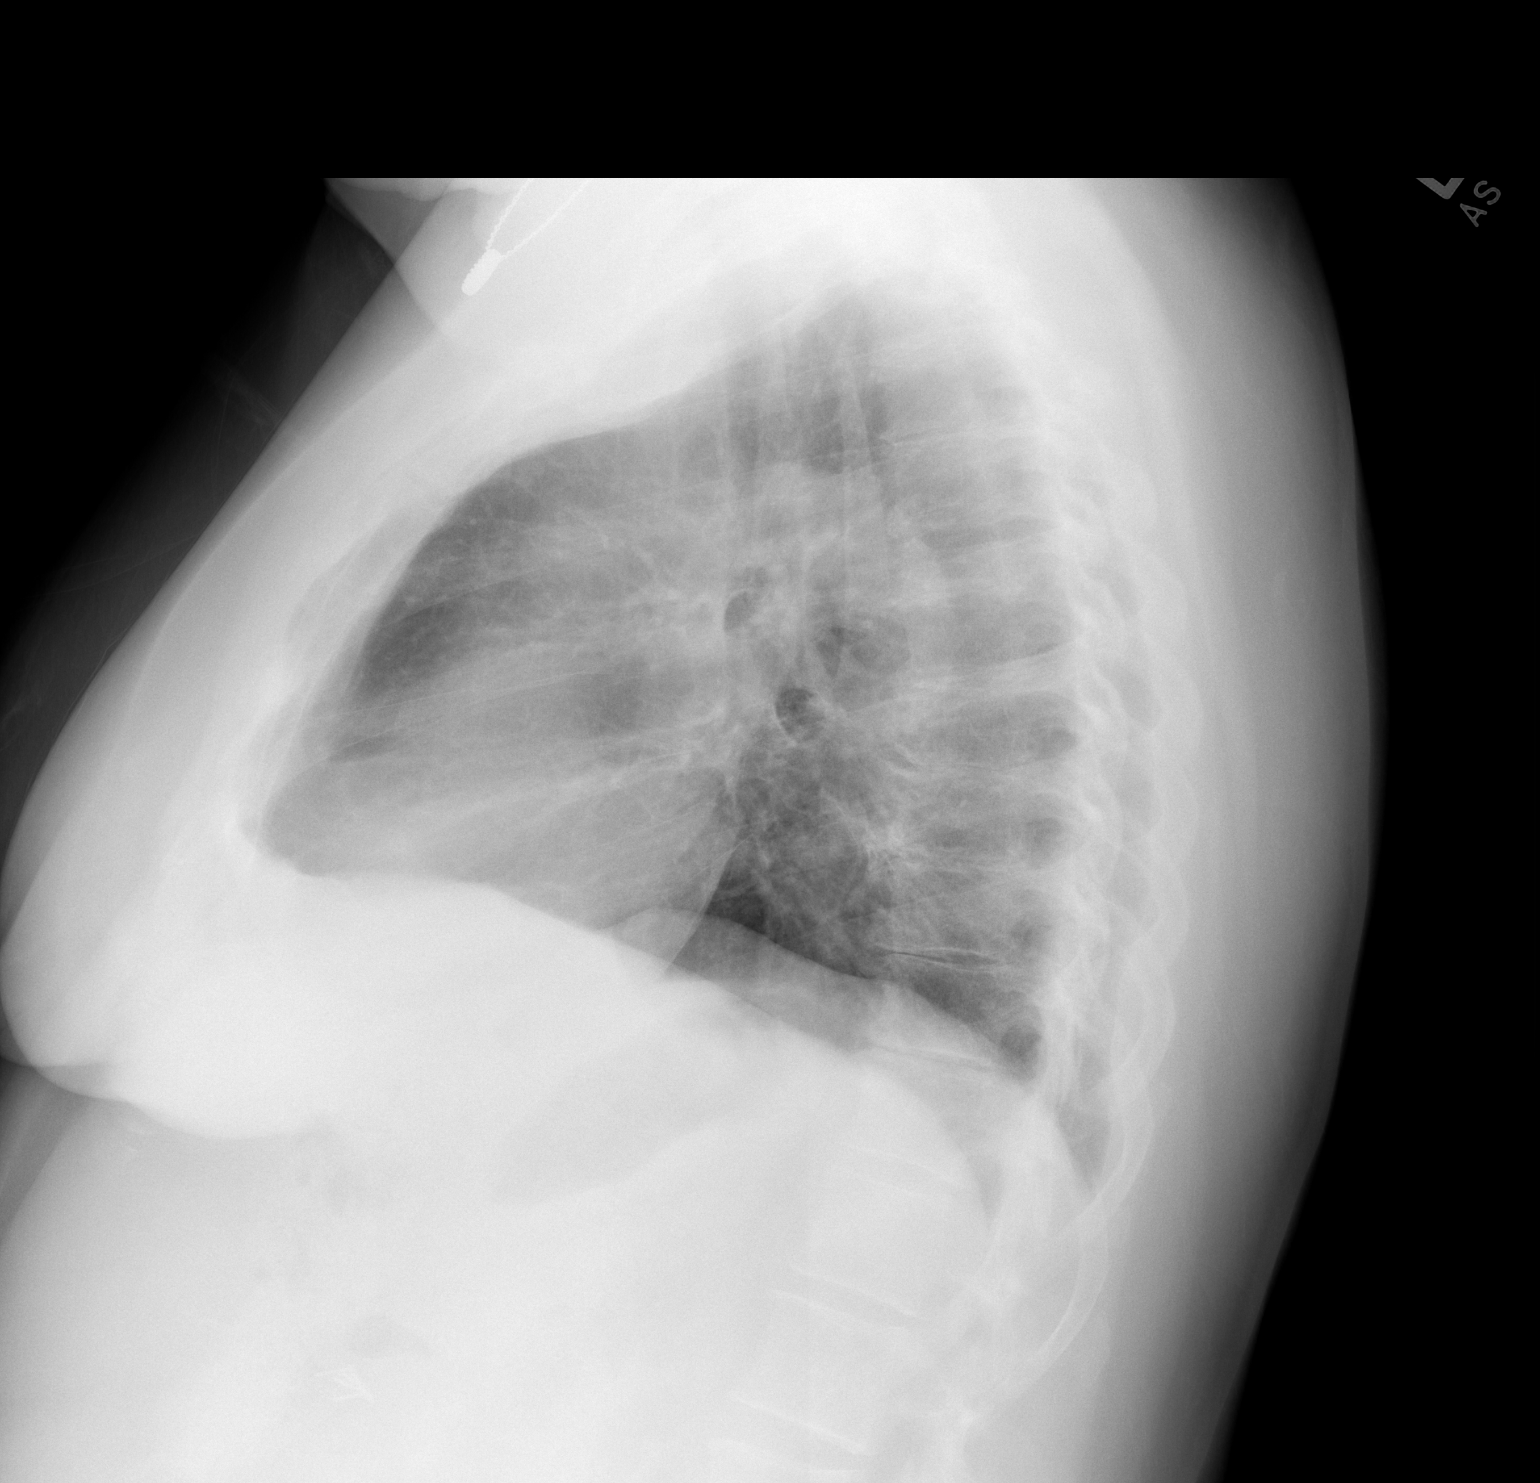

[2 of 2 positions shown; findings below may reference images not displayed]

FINDINGS: The heart size and mediastinal contours are within normal limits.
There is no focal infiltrate or pulmonary edema. There is no pleural
effusion. The visualized skeletal structures are unremarkable.
IMPRESSION: No active cardiopulmonary disease.

## 2022-06-13 NOTE — Progress Notes (Unsigned)
Cardiology Office Note   Date:  06/16/2022   ID:  Olivia Barker, DOB 11/04/1955, MRN [id number]  PCP:  Josetta Huddle, MD  Cardiologist:   Dorris Carnes, MD   Follow-up of hypertension   History of Present Illness: Olivia Barker is a 67 y.o. female with a history of hypertension, hyperlipidemia, family history of CAD, gastroparesis.  Stress test was low risk in 2018.  Echo in 2018 showed normal LV function.  2022   SHe went on to have a CT calcium score which was 0    Lpa increased.      She was seen by Elwyn Reach in AUg 2023 Since seen she says her breathing is OK  No CP   Does have severe gastroparesis      Labs in Jan A1C 6.4  LDL 110  She admits to eating poorly over holidays    Current Meds  Medication Sig   ALPRAZolam (XANAX) 1 MG tablet TAKE ONE TABLET THREE TIMES DAILY AS NEEDED FOR ANXIETY (Patient taking differently: Take 1 mg by mouth 3 (three) times daily as needed for anxiety.)   buPROPion (WELLBUTRIN XL) 150 MG 24 hr tablet Take 150 mg by mouth every morning.   butalbital-acetaminophen-caffeine (FIORICET) 50-325-40 MG tablet Take 1 tablet by mouth as needed. MIGRAINE   dronabinol (MARINOL) 5 MG capsule Take 5 mg by mouth 2 (two) times daily.   lamoTRIgine (LAMICTAL) 100 MG tablet Take 100 mg by mouth at bedtime.   Nebivolol HCl (BYSTOLIC) 20 MG TABS Take 1 tablet (20 mg total) by mouth daily.   promethazine (PHENERGAN) 25 MG tablet Take 25 mg by mouth every 6 (six) hours as needed for nausea or vomiting.    rosuvastatin (CRESTOR) 20 MG tablet Take 1 tablet (20 mg total) by mouth daily.   zolpidem (AMBIEN) 10 MG tablet TAKE 1 TABLET AT BEDTIME AS NEEDED. (Patient taking differently: Take 10 mg by mouth at bedtime as needed for sleep.)     Allergies:   Aspirin, Iodinated contrast media, Iohexol, Nsaids, and Macrobid  [nitrofurantoin macrocrystal]   Past Medical History:  Diagnosis Date   Bladder pain    Chronic back pain    secondary to IC   Congenital absence of  ovary    per pt unknown which side--    Depression    GAD (generalized anxiety disorder)    Gastroparesis    GERD (gastroesophageal reflux disease)    History of adenomatous polyp of colon    2010 tubular adenoma and hyperplastic   History of echocardiogram    Echo 7/18:  EF 55-60, no RWMA   History of gastric polyp    2014 hyperplastic   History of gastritis    History of nuclear stress test    Myoview 7/18: EF 71, no ischemia, low risk   History of sepsis    urosepsis w/ acute pyelonephritis 09/ 2015   Hyperlipidemia    Interstitial cystitis 1992   Nephrolithiasis    multiple bilateral non-obstructive per abd. xray 11-21-2015   Sensation of pressure in bladder area    Tinnitus    Fluid in ears--  continally    Past Surgical History:  Procedure Laterality Date   ABDOMINAL HYSTERECTOMY  1998   ABDOMINOPLASTY  06/02/2001   BREAST REDUCTION SURGERY Bilateral 2008   CARDIOVASCULAR STRESS TEST  02-25-2012  dr Dorris Carnes   normal lexiscan nuclear study w/ no ischemia/  normal LV function and wall function , ef 79%  CARDIOVASCULAR STRESS TEST  02/25/2012   normal nuclear study w/ no ischemia/  normal LV function and wall motion , ef 79%   CESAREAN SECTION  06-12-1986   CHOLECYSTECTOMY  apr 2014   CYSTO WITH HYDRODISTENSION N/A 03/03/2016   Procedure: CYSTOSCOPY/HYDRODISTENSION AND INSTILL MARCAINE AND PYRIDIUM;  Surgeon: Irine Seal, MD;  [facility];  Service: Urology;  Laterality: N/A;   CYSTO/ HYDRODISTENTION/ INSTILLATION THERAPY  last one early 1990's   EUS N/A 06/29/2012   Procedure: ESOPHAGEAL ENDOSCOPIC ULTRASOUND (EUS) RADIAL;  Surgeon: Arta Silence, MD;  Location: [facility];  Service: Endoscopy;  Laterality: N/A;   EUS N/A 10/04/2013   Procedure: ESOPHAGEAL ENDOSCOPIC ULTRASOUND (EUS) RADIAL;  Surgeon: Arta Silence, MD;  Location: [facility];  Service: Endoscopy;  Laterality: N/A;   EXTRACORPOREAL SHOCK WAVE LITHOTRIPSY Right 11/21/2015    ORIF RIGHT 5TH METATARSAL FX  02/04/2001     Social History:  The patient  reports that she has never smoked. She has never used smokeless tobacco. She reports that she does not drink alcohol and does not use drugs.   Family History:  The patient's family history includes Dementia in her maternal grandmother and mother; Diabetes in her mother; Diverticulosis in her son; Gout in her son; Heart attack in her father; Heart disease in her father; Suicidality in her paternal grandmother.    ROS:  Please see the history of present illness. All other systems are reviewed and  Negative to the above problem except as noted.    PHYSICAL EXAM: VS:  BP 116/80   Pulse (!) 118   Ht '5\' 2"'$  (1.575 m)   Wt 177 lb (80.3 kg)   SpO2 98%   BMI 32.37 kg/m    GEN: Obese 67 yo, in no acute distress  HEENT: normal  Neck: no JVD, no carotid bruit Cardiac: RRR; no murmurs,no LE  edema  Respiratory:  clear to auscultation  GI: soft, nontender, nondistended, + BS  No hepatomegaly  MS: no deformity Moving all extremities   Skin: warm and dry, no rash Neuro:  Grossly intact  Psych: euthymic mood, full affect   EKG:  EKG is ordered today.  ST 118 bpm     Lipid Panel    Component Value Date/Time   CHOL 183 06/17/2015 1027   CHOL 158 09/18/2013 0921   TRIG 237 (H) 06/17/2015 1027   TRIG 214 (H) 09/18/2013 0921   HDL 51 06/17/2015 1027   HDL 40 09/18/2013 0921   CHOLHDL 3.6 06/17/2015 1027   CHOLHDL 4 09/04/2009 0758   VLDL 38.6 09/04/2009 0758   LDLCALC 85 06/17/2015 1027   LDLCALC 75 09/18/2013 0921   LDLDIRECT 214.6 07/02/2009 0956      Wt Readings from Last 3 Encounters:  06/16/22 177 lb (80.3 kg)  02/10/22 179 lb 3.2 oz (81.3 kg)  10/21/21 182 lb 15.7 oz (83 kg)      ASSESSMENT AND PLAN:  1  HTN  BP controlled on current regimen     2  Elevated HR   Pt has an elevated HR   She has had this in the past   Monitor showed avg HR below 100.    Some may reflect inadequate hydration as po  intake tough   Some may reflect autonomic dysfunction in setting of gastroparesis.  3  HL  Contine Crestor    LDL 59  Lpa 91  4  GI   Continues to have signficant symptoms  May look into a GPOEM procedure        Current medicines are reviewed at length with the patient today.  The patient does not have concerns regarding medicines.  Signed, Dorris Carnes, MD  06/16/2022 11:10 AM    [address], [place], [place]  [address] Phone: [phone]; Fax: [phone]

## 2022-06-16 ENCOUNTER — Ambulatory Visit: Payer: Medicare Other | Attending: Internal Medicine | Admitting: Internal Medicine

## 2022-06-16 ENCOUNTER — Encounter: Payer: Self-pay | Admitting: Internal Medicine

## 2022-06-16 VITALS — BP 116/80 | HR 118 | Ht 62.0 in | Wt 177.0 lb

## 2022-06-16 DIAGNOSIS — Z79899 Other long term (current) drug therapy: Secondary | ICD-10-CM

## 2022-06-16 DIAGNOSIS — I10 Essential (primary) hypertension: Secondary | ICD-10-CM | POA: Insufficient documentation

## 2022-06-16 DIAGNOSIS — E119 Type 2 diabetes mellitus without complications: Secondary | ICD-10-CM

## 2022-06-16 DIAGNOSIS — E785 Hyperlipidemia, unspecified: Secondary | ICD-10-CM | POA: Diagnosis present

## 2022-06-16 NOTE — Patient Instructions (Signed)
Medication Instructions:   *If you need a refill on your cardiac medications before your next appointment, please call your pharmacy*   Lab Work: [place]...  If you have labs (blood work) drawn today and your tests are completely normal, you will receive your results only by: [place] (if you have MyChart) OR A paper copy in the mail If you have any lab test that is abnormal or we need to change your treatment, we will call you to review the results.   Testing/Procedures:    Follow-Up: At [facility], you and your health needs are our priority.  As part of our continuing mission to provide you with exceptional heart care, we have created designated Provider Care Teams.  These Care Teams include your primary Cardiologist (physician) and Advanced Practice Providers (APPs -  Physician Assistants and Nurse Practitioners) who all work together to provide you with the care you need, when you need it.  We recommend signing up for the patient portal called "MyChart".  Sign up information is provided on this After Visit Summary.  MyChart is used to connect with patients for Virtual Visits (Telemedicine).  Patients are able to view lab/test results, encounter notes, upcoming appointments, etc.  Non-urgent messages can be sent to your provider as well.   To learn more about what you can do with MyChart, go to [email or web address].    Your next appointment:   9 month(s)  Provider:   Dorris Carnes, MD     Other Instructions

## 2022-10-08 ENCOUNTER — Encounter (INDEPENDENT_AMBULATORY_CARE_PROVIDER_SITE_OTHER): Payer: Self-pay | Admitting: Internal Medicine

## 2022-10-14 ENCOUNTER — Encounter (INDEPENDENT_AMBULATORY_CARE_PROVIDER_SITE_OTHER): Payer: Medicare Other | Admitting: Family Medicine

## 2022-11-02 ENCOUNTER — Other Ambulatory Visit: Payer: Self-pay

## 2022-12-07 ENCOUNTER — Encounter (INDEPENDENT_AMBULATORY_CARE_PROVIDER_SITE_OTHER): Payer: Medicare Other | Admitting: Family Medicine

## 2023-02-08 NOTE — Progress Notes (Deleted)
  Cardiology Office Note:    Date:  02/08/2023  ID:  Olivia Barker, DOB 08-17-1955, MRN [id number] PCP: Marden Noble, MD  [facility] Providers Cardiologist:  Dietrich Pates, MD { Click to update primary MD,subspecialty MD or APP then REFRESH:1}    {Click to Open Review  :1}   Patient Profile:      Hypertension  Hyperlipidemia  Lp(a) 91.2 FHx CAD Myoview 02/2012: Normal, EF 79  Myoview 11/30/16: No reversible ischemia. LVEF 71% with normal wall motion. This is a low risk study. TTE 11/30/16: EF 55-60, normal wall motion CAC score 03/21/21: 0 TTE 02/10/22: EF 60-65, no RWMA, low NL RVSF, NL PASP, RVSP 17.6, trivial MR, RAP 3  Sinus tachycardia Monitor 03/2021: NSR, rare PVCs, PACs GERD, gastroparesis       {      :1}   History of Present Illness:  Discussed the use of AI scribe software for clinical note transcription with the patient, who gave verbal consent to proceed.  Olivia Barker is a 67 y.o. female who returns for ***          ROS   See HPI ***    Studies Reviewed:       *** Risk Assessment/Calculations:   {Does this patient have ATRIAL FIBRILLATION?[phone]} No BP recorded.  {Refresh Note OR Click here to enter BP  :1}***       Physical Exam:   VS:  There were no vitals taken for this visit.   Wt Readings from Last 3 Encounters:  06/16/22 177 lb (80.3 kg)  02/10/22 179 lb 3.2 oz (81.3 kg)  10/21/21 182 lb 15.7 oz (83 kg)    Physical Exam***     Assessment and Plan:   Assessment & Plan Essential hypertension  Hyperlipidemia LDL goal <70   Assessment and Plan             {      :1}    {Are you ordering a CV Procedure (e.g. stress test, cath, DCCV, TEE, etc)?   Press F2        :[id number]}  Dispo:  No follow-ups on file.  Signed, Tereso Newcomer, PA-C

## 2023-02-09 ENCOUNTER — Ambulatory Visit: Payer: Medicare Other | Admitting: Physician Assistant

## 2023-02-09 DIAGNOSIS — E785 Hyperlipidemia, unspecified: Secondary | ICD-10-CM

## 2023-02-09 DIAGNOSIS — I10 Essential (primary) hypertension: Secondary | ICD-10-CM

## 2023-02-26 NOTE — Progress Notes (Signed)
Cardiology Office Note:  .   Date:  03/09/2023  ID:  Olivia Barker, DOB 1955/05/17, MRN [id number] PCP: Thana Ates, MD  [facility] Providers Cardiologist:  Dietrich Pates, MD    History of Present Illness: .   Olivia Barker is a 67 y.o. female   with a history of hypertension, hyperlipidemia, family history of CAD, gastroparesis.  Stress test was low risk in 2018.  Echo in 2018 showed normal LV function.    She had a CT calcium score which was 0 2022.    Lpa increased 91.     Patient saw Dr. Tenny Craw 06/2022 with elevated HR may reflect inadequate hydration with autonomic dysfunction in setting of gastroparesis. Monitor showed avg HR below 100.   Patient added onto my schedule to review blood work that was to be faxed.Patient says she was having a lot of headaches and saw PCP and HR beating fast and numbness in hands and feet and B12 was low. She had B12 shots and is feeling better. 01/2022 fasting blood work  A1C 6.3, trig 310, LDL 98 HDL 38, Crt 0.88. She has severe gastroparesis so can't eat a high fiber diet. Says she doesn't eat a lot of sugar but has to eat white bread etc because of GI problems.  ROS:    Studies Reviewed: Marland Kitchen         Prior CV Studies:   Echo 02/2022 IMPRESSIONS     1. Left ventricular ejection fraction, by estimation, is 60 to 65%. The  left ventricle has normal function. The left ventricle has no regional  wall motion abnormalities. Left ventricular diastolic parameters were  normal.   2. Right ventricular systolic function is low normal. The right  ventricular size is normal. There is normal pulmonary artery systolic  pressure. The estimated right ventricular systolic pressure is 17.6 mmHg.   3. The mitral valve is normal in structure. Trivial mitral valve  regurgitation.   4. The aortic valve is tricuspid. Aortic valve regurgitation is not  visualized. No aortic stenosis is present.   5. The inferior vena cava is normal in size with greater than 50%   respiratory variability, suggesting right atrial pressure of 3 mmHg.   FINDINGS   Left Ventricle: Left ventricular ejection fraction, by estimation, is 60  to 65%. The left ventricle has normal function. The left ventricle has no  regional wall motion abnormalities. The left ventricular internal cavity  size was normal in size. There is   no left ventricular hypertrophy. Left ventricular diastolic parameters  were normal.   Right Ventricle: The right ventricular size is normal. No increase in  right ventricular wall thickness. Right ventricular systolic function is  low normal. There is normal pulmonary artery systolic pressure. The  tricuspid regurgitant velocity is 1.91 m/s,   and with an assumed right atrial pressure of 3 mmHg, the estimated right  ventricular systolic pressure is 17.6 mmHg.   Left Atrium: Left atrial size was normal in size.   Right Atrium: Right atrial size was normal in size.   Pericardium: There is no evidence of pericardial effusion.   Mitral Valve: The mitral valve is normal in structure. Trivial mitral  valve regurgitation.   Tricuspid Valve: The tricuspid valve is normal in structure. Tricuspid  valve regurgitation is trivial.   Aortic Valve: The aortic valve is tricuspid. Aortic valve regurgitation is  not visualized. No aortic stenosis is present.   Pulmonic Valve: The pulmonic valve was not  well visualized. Pulmonic valve  regurgitation is trivial.   Aorta: The aortic root and ascending aorta are structurally normal, with  no evidence of dilitation.   Venous: The inferior vena cava is normal in size with greater than 50%  respiratory variability, suggesting right atrial pressure of 3 mmHg.   IAS/Shunts: The interatrial septum was not well visualized.      Risk Assessment/Calculations:             Physical Exam:   VS:  BP 112/72   Pulse 81   Ht 5\' 2"  (1.575 m)   Wt 184 lb 3.2 oz (83.6 kg)   SpO2 95%   BMI 33.69 kg/m    Wt  Readings from Last 3 Encounters:  03/09/23 184 lb 3.2 oz (83.6 kg)  06/16/22 177 lb (80.3 kg)  02/10/22 179 lb 3.2 oz (81.3 kg)    GEN: Well nourished, well developed in no acute distress NECK: No JVD; No carotid bruits CARDIAC: RRR, no murmurs, rubs, gallops RESPIRATORY:  Clear to auscultation without rales, wheezing or rhonchi  ABDOMEN: Soft, non-tender, non-distended EXTREMITIES:  No edema; No deformity   ASSESSMENT AND PLAN: .    HTN-BP well controlled on nebivolol  HLD labs on KPN LDL 98, trig 310 and A1C 6.3. calcium score 0 in 2022 but strong family history of early CAD. Will start Vascepa 2 gm bid and recheck FLP in 3 months. If they don't come down or she doesn't tolerate refer to lipid clinic.  Family history of CAD        Dispo: f/u as scheduled.  Signed, Jacolyn Reedy, PA-C

## 2023-03-09 ENCOUNTER — Encounter: Payer: Self-pay | Admitting: Physician Assistant

## 2023-03-09 ENCOUNTER — Ambulatory Visit: Payer: Medicare Other | Attending: Physician Assistant | Admitting: Physician Assistant

## 2023-03-09 VITALS — BP 112/72 | HR 81 | Ht 62.0 in | Wt 184.2 lb

## 2023-03-09 DIAGNOSIS — E785 Hyperlipidemia, unspecified: Secondary | ICD-10-CM | POA: Diagnosis not present

## 2023-03-09 DIAGNOSIS — I10 Essential (primary) hypertension: Secondary | ICD-10-CM | POA: Insufficient documentation

## 2023-03-09 DIAGNOSIS — Z8249 Family history of ischemic heart disease and other diseases of the circulatory system: Secondary | ICD-10-CM | POA: Diagnosis present

## 2023-03-09 MED ORDER — ICOSAPENT ETHYL 1 G PO CAPS: 2.0000 g | ORAL_CAPSULE | Freq: Two times a day (BID) | ORAL | 3 refills | Status: DC

## 2023-03-09 NOTE — Patient Instructions (Signed)
Medication Instructions:  Your physician has recommended you make the following change in your medication:  START VASCEPA 2G TWICE DAILY WITH MEALS   *If you need a refill on your cardiac medications before your next appointment, please call your pharmacy*   Lab Work: TO BE DONE IN 3 MONTHS: FASTING LIPIDS  If you have labs (blood work) drawn today and your tests are completely normal, you will receive your results only by: MyChart Message (if you have MyChart) OR A paper copy in the mail If you have any lab test that is abnormal or we need to change your treatment, we will call you to review the results.   Testing/Procedures: NONE   Follow-Up: At [facility], you and your health needs are our priority.  As part of our continuing mission to provide you with exceptional heart care, we have created designated Provider Care Teams.  These Care Teams include your primary Cardiologist (physician) and Advanced Practice Providers (APPs -  Physician Assistants and Nurse Practitioners) who all work together to provide you with the care you need, when you need it.  We recommend signing up for the patient portal called "MyChart".  Sign up information is provided on this After Visit Summary.  MyChart is used to connect with patients for Virtual Visits (Telemedicine).  Patients are able to view lab/test results, encounter notes, upcoming appointments, etc.  Non-urgent messages can be sent to your provider as well.   To learn more about what you can do with MyChart, go to [email or web address].    Your next appointment:   KEEP SCHEDULED FOLLOW-UP

## 2023-03-29 ENCOUNTER — Other Ambulatory Visit: Payer: Self-pay | Admitting: Internal Medicine

## 2023-03-30 ENCOUNTER — Encounter: Payer: Self-pay | Admitting: Internal Medicine

## 2023-03-30 ENCOUNTER — Other Ambulatory Visit: Payer: Self-pay

## 2023-03-30 MED ORDER — ROSUVASTATIN CALCIUM 20 MG PO TABS: 20.0000 mg | ORAL_TABLET | Freq: Every day | ORAL | 3 refills | Status: DC

## 2023-03-30 NOTE — Telephone Encounter (Signed)
 Sent RX to requested Pharmacy

## 2023-04-07 ENCOUNTER — Other Ambulatory Visit: Payer: Self-pay | Admitting: Urology

## 2023-05-04 ENCOUNTER — Telehealth: Payer: Self-pay | Admitting: Internal Medicine

## 2023-05-04 NOTE — Telephone Encounter (Signed)
Olivia Barker 67 year old female is requesting preop cardiac evaluation for cystoscopy.  She was last seen in clinic on 03/09/2023.  During that time she reported headaches, fast heartbeat and numbness in her hands and feet.  She reported severe gastroparesis.  Her hypertension was well-controlled.  Which should be acceptable risk from a cardiac standpoint for her upcoming procedure?  Please direct your response to CV DIV preop pool.  Thank you for your help.  Thomasene Ripple. Redonna Wilbert NP-C     05/04/2023, 11:02 AM [facility] [address] Office [phone] Fax [phone]

## 2023-05-04 NOTE — Telephone Encounter (Signed)
   Pre-operative Risk Assessment    Patient Name: Olivia Barker  DOB: 10-20-1955 MRN: [id number]      Request for Surgical Clearance    Procedure:   cystoscopy Hydrodistention  Date of Surgery:  Clearance 06/17/23                                 Surgeon:  Dr Ander Slade Group or Practice Name:  [facility] Phone number:  [phone] Fax number:  [phone]   Type of Clearance Requested:   - Medical    Type of Anesthesia:  General    Additional requests/questions:    SignedAndreas Blower   05/04/2023, 10:50 AM

## 2023-05-06 NOTE — Telephone Encounter (Signed)
   Name: Olivia Barker  DOB: Jan 07, 1956  MRN: [id number]  Primary Cardiologist: Dietrich Pates, MD   Preoperative team, please contact this patient and set up a phone call appointment for further preoperative risk assessment. Please obtain consent and complete medication review. Thank you for your help.  I confirm that guidance regarding antiplatelet and oral anticoagulation therapy has been completed and, if necessary, noted below, none requested.   I also confirmed the patient resides in the state of [place]. As per [facility] Medical Board telemedicine laws, the patient must reside in the state in which the provider is licensed.  Rip Harbour, NP 05/06/2023, 12:22 PM [facility]

## 2023-05-06 NOTE — Telephone Encounter (Signed)
Left message to call back to schedule tele pre op appt.  

## 2023-05-07 NOTE — Telephone Encounter (Signed)
2nd attempt to reach pt

## 2023-05-10 ENCOUNTER — Telehealth: Payer: Self-pay | Admitting: *Deleted

## 2023-05-10 NOTE — Telephone Encounter (Signed)
Patient is returning call.  °

## 2023-05-10 NOTE — Telephone Encounter (Signed)
.    Patient Consent for Virtual Visit        Olivia Barker has provided verbal consent on 05/10/2023 for a virtual visit (video or telephone).   CONSENT FOR VIRTUAL VISIT FOR:  Olivia Barker  By participating in this virtual visit I agree to the following:  I hereby voluntarily request, consent and authorize [facility] and its employed or contracted physicians, physician assistants, nurse practitioners or other licensed health care professionals (the Practitioner), to provide me with telemedicine health care services (the "Services") as deemed necessary by the treating Practitioner. I acknowledge and consent to receive the Services by the Practitioner via telemedicine. I understand that the telemedicine visit will involve communicating with the Practitioner through live audiovisual communication technology and the disclosure of certain medical information by electronic transmission. I acknowledge that I have been given the opportunity to request an in-person assessment or other available alternative prior to the telemedicine visit and am voluntarily participating in the telemedicine visit.  I understand that I have the right to withhold or withdraw my consent to the use of telemedicine in the course of my care at any time, without affecting my right to future care or treatment, and that the Practitioner or I may terminate the telemedicine visit at any time. I understand that I have the right to inspect all information obtained and/or recorded in the course of the telemedicine visit and may receive copies of available information for a reasonable fee.  I understand that some of the potential risks of receiving the Services via telemedicine include:  Delay or interruption in medical evaluation due to technological equipment failure or disruption; Information transmitted may not be sufficient (e.g. poor resolution of images) to allow for appropriate medical decision making by the Practitioner;  and/or  In rare instances, security protocols could fail, causing a breach of personal health information.  Furthermore, I acknowledge that it is my responsibility to provide information about my medical history, conditions and care that is complete and accurate to the best of my ability. I acknowledge that Practitioner's advice, recommendations, and/or decision may be based on factors not within their control, such as incomplete or inaccurate data provided by me or distortions of diagnostic images or specimens that may result from electronic transmissions. I understand that the practice of medicine is not an exact science and that Practitioner makes no warranties or guarantees regarding treatment outcomes. I acknowledge that a copy of this consent can be made available to me via my patient portal [facility] MyChart), or I can request a printed copy by calling the office of [facility].    I understand that my insurance will be billed for this visit.   I have read or had this consent read to me. I understand the contents of this consent, which adequately explains the benefits and risks of the Services being provided via telemedicine.  I have been provided ample opportunity to ask questions regarding this consent and the Services and have had my questions answered to my satisfaction. I give my informed consent for the services to be provided through the use of telemedicine in my medical care

## 2023-05-21 ENCOUNTER — Ambulatory Visit: Payer: Medicare Other | Attending: Cardiology | Admitting: Student

## 2023-05-21 DIAGNOSIS — Z01810 Encounter for preprocedural cardiovascular examination: Secondary | ICD-10-CM

## 2023-05-21 NOTE — Addendum Note (Signed)
 Addended by: Carlos Levering on: 05/21/2023 03:05 PM   Modules accepted: Level of Service

## 2023-05-21 NOTE — Progress Notes (Signed)
   Virtual Visit via Telephone Note   Unable to complete virtual visit. Contacted patient at scheduled time and call when directly to voice mail. Message left letting her know office was closing early on account of the weather. Attempted patient again x 4 with call continuing to go to voice mail.   She will be contacted by pre-op team on Monday, 1/13 to reschedule.    Barnie Hila, NP  05/21/2023, 8:39 AM

## 2023-05-24 NOTE — Telephone Encounter (Signed)
 Pt has been rescheduled for a tele visit, 06/01/23 2:40.

## 2023-05-24 NOTE — Telephone Encounter (Signed)
 Per Carlos Levering, NP, pt needs to be rescheduled for tele viist.

## 2023-06-01 ENCOUNTER — Ambulatory Visit: Payer: Medicare Other | Attending: Cardiology | Admitting: Emergency Medicine

## 2023-06-01 DIAGNOSIS — Z01810 Encounter for preprocedural cardiovascular examination: Secondary | ICD-10-CM

## 2023-06-01 NOTE — Progress Notes (Signed)
Virtual Visit via Telephone Note   Because of Olivia Barker's co-morbid illnesses, she is at least at moderate risk for complications without adequate follow up.  This format is felt to be most appropriate for this patient at this time.  The patient did not have access to video technology/had technical difficulties with video requiring transitioning to audio format only (telephone).  All issues noted in this document were discussed and addressed.  No physical exam could be performed with this format.  Please refer to the patient's chart for her consent to telehealth for [facility].  Evaluation Performed:  Preoperative cardiovascular risk assessment _____________   Date:  06/01/2023   Patient ID:  Olivia Barker, DOB [birth year], MRN [id number] Patient Location:  Home Provider location:   Office  Primary Care Provider:  Thana Ates, MD Primary Cardiologist:  Dietrich Pates, MD  Chief Complaint / Patient Profile   68 y.o. y/o female with a h/o hypertension, hyperlipidemia, family history of CAD, gastroparesis who is pending cystoscopy hydrodistention with [facility] by Dr. Arita Miss on 06/17/2023 and presents today for telephonic preoperative cardiovascular risk assessment.  History of Present Illness    Olivia Barker is a 68 y.o. female who presents via audio/video conferencing for a telehealth visit today.  Pt was last seen in cardiology clinic on 03/09/2023 by Olivia Reedy, PA.  At that time Olivia Barker was doing well.  The patient is now pending procedure as outlined above. Since her last visit, she denies chest pain, shortness of breath, lower extremity edema, fatigue, palpitations, melena, hematuria, hemoptysis, diaphoresis, weakness, presyncope, syncope, orthopnea, and PND.  Past Medical History    Past Medical History:  Diagnosis Date   Bladder pain    Chronic back pain    secondary to IC   Congenital absence of ovary    per pt unknown which side--    Depression     GAD (generalized anxiety disorder)    Gastroparesis    GERD (gastroesophageal reflux disease)    History of adenomatous polyp of colon    2010 tubular adenoma and hyperplastic   History of echocardiogram    Echo 7/18:  EF 55-60, no RWMA   History of gastric polyp    2014 hyperplastic   History of gastritis    History of nuclear stress test    Myoview 7/18: EF 71, no ischemia, low risk   History of sepsis    urosepsis w/ acute pyelonephritis 09/ 2015   Hyperlipidemia    Interstitial cystitis 1992   Nephrolithiasis    multiple bilateral non-obstructive per abd. xray 11-21-2015   Sensation of pressure in bladder area    Tinnitus    Fluid in ears--  continally   Past Surgical History:  Procedure Laterality Date   ABDOMINAL HYSTERECTOMY  1998   ABDOMINOPLASTY  06/02/2001   BREAST REDUCTION SURGERY Bilateral 2008   CARDIOVASCULAR STRESS TEST  02-25-2012  dr Dietrich Pates   normal lexiscan nuclear study w/ no ischemia/  normal LV function and wall function , ef 79%   CARDIOVASCULAR STRESS TEST  02/25/2012   normal nuclear study w/ no ischemia/  normal LV function and wall motion , ef 79%   CESAREAN SECTION  06-12-1986   CHOLECYSTECTOMY  apr 2014   CYSTO WITH HYDRODISTENSION N/A 03/03/2016   Procedure: CYSTOSCOPY/HYDRODISTENSION AND INSTILL MARCAINE AND PYRIDIUM;  Surgeon: Bjorn Pippin, MD;  Location: [facility];  Service: Urology;  Laterality: N/A;   CYSTO/  HYDRODISTENTION/ INSTILLATION THERAPY  last one early 1990's   EUS N/A 06/29/2012   Procedure: ESOPHAGEAL ENDOSCOPIC ULTRASOUND (EUS) RADIAL;  Surgeon: Willis Modena, MD;  Location: [facility];  Service: Endoscopy;  Laterality: N/A;   EUS N/A 10/04/2013   Procedure: ESOPHAGEAL ENDOSCOPIC ULTRASOUND (EUS) RADIAL;  Surgeon: Willis Modena, MD;  Location: [facility];  Service: Endoscopy;  Laterality: N/A;   EXTRACORPOREAL SHOCK WAVE LITHOTRIPSY Right 11/21/2015   ORIF RIGHT 5TH METATARSAL FX  02/04/2001     Allergies  Allergies  Allergen Reactions   Aspirin Anaphylaxis   Iodinated Contrast Media Anaphylaxis   Iohexol Anaphylaxis    During ivp in 1993 pt's throat began to swell w/ anaphylaxis. No studies have been done w/ 13 hr prep//a.calhoun During ivp in 1993 pt's throat began to swell w/ anaphylaxis. No studies have been done w/ 13 hr prep//a.calhoun    Nsaids Anaphylaxis   Macrobid  [Nitrofurantoin Macrocrystal] Itching and Swelling    Eye sweliing    Home Medications    Prior to Admission medications   Medication Sig Start Date End Date Taking? Authorizing Provider  ALPRAZolam (XANAX) 1 MG tablet TAKE ONE TABLET THREE TIMES DAILY AS NEEDED FOR ANXIETY Patient taking differently: Take 1 mg by mouth 3 (three) times daily as needed for anxiety. 11/08/20   Cherie Ouch, PA-C  buPROPion (WELLBUTRIN XL) 150 MG 24 hr tablet Take 150 mg by mouth every morning. 09/11/21   [provider]  butalbital-acetaminophen-caffeine (FIORICET) 50-325-40 MG tablet Take 1 tablet by mouth as needed. MIGRAINE    [provider]  icosapent Ethyl (VASCEPA) 1 g capsule Take 2 capsules (2 g total) by mouth 2 (two) times daily. 03/09/23   Dyann Kief, PA-C  lamoTRIgine (LAMICTAL) 100 MG tablet Take 100 mg by mouth at bedtime. 09/29/21   [provider]  Nebivolol HCl (BYSTOLIC) 20 MG TABS Take 1 tablet (20 mg total) by mouth daily. 01/07/22   Swinyer, Zachary George, NP  promethazine (PHENERGAN) 25 MG tablet Take 25 mg by mouth every 6 (six) hours as needed for nausea or vomiting.  11/19/15   [provider]  rosuvastatin (CRESTOR) 20 MG tablet Take 1 tablet (20 mg total) by mouth daily. 03/30/23   Pricilla Riffle, MD  zolpidem (AMBIEN) 10 MG tablet TAKE 1 TABLET AT BEDTIME AS NEEDED. Patient taking differently: Take 10 mg by mouth at bedtime as needed for sleep. 10/03/20   Cherie Ouch, PA-C    Physical Exam    Vital Signs:  Olivia Barker does not have vital signs  available for review today.  Given telephonic nature of communication, physical exam is limited. AAOx3. NAD. Normal affect.  Speech and respirations are unlabored.  Accessory Clinical Findings    None  Assessment & Plan    1.  Preoperative Cardiovascular Risk Assessment: According to the Revised Cardiac Risk Index (RCRI), her Perioperative Risk of Major Cardiac Event is (%): 0.4. Her Functional Capacity in METs is: 9.89 according to the Duke Activity Status Index (DASI). Therefore, based on ACC/AHA guidelines, patient would be at acceptable risk for the planned procedure without further cardiovascular testing.   The patient was advised that if she develops new symptoms prior to surgery to contact our office to arrange for a follow-up visit, and she verbalized understanding.  A copy of this note will be routed to requesting surgeon.  Time:   Today, I have spent 9 minutes with the patient with telehealth technology discussing medical history,  symptoms, and management plan.     Denyce Robert, NP  06/01/2023, 11:18 AM

## 2023-06-14 ENCOUNTER — Encounter (HOSPITAL_BASED_OUTPATIENT_CLINIC_OR_DEPARTMENT_OTHER): Payer: Self-pay | Admitting: Urology

## 2023-06-17 ENCOUNTER — Ambulatory Visit (HOSPITAL_BASED_OUTPATIENT_CLINIC_OR_DEPARTMENT_OTHER): Admission: RE | Admit: 2023-06-17 | Payer: Medicare Other | Source: Ambulatory Visit | Admitting: Urology

## 2023-06-17 SURGERY — CYSTOSCOPY, WITH BLADDER HYDRODISTENSION AND BIOPSY
Anesthesia: General

## 2023-06-21 ENCOUNTER — Other Ambulatory Visit (HOSPITAL_COMMUNITY): Payer: Self-pay

## 2023-06-21 MED ORDER — MOUNJARO 2.5 MG/0.5ML ~~LOC~~ SOAJ
2.5000 mg | SUBCUTANEOUS | 28 refills | Status: AC
  Filled 2023-06-21 – 2023-06-22 (×2): qty 2, 28d supply, fill #0
  Filled 2024-01-12: qty 2, 28d supply, fill #1

## 2023-06-22 ENCOUNTER — Other Ambulatory Visit (HOSPITAL_COMMUNITY): Payer: Self-pay

## 2023-07-30 ENCOUNTER — Other Ambulatory Visit: Payer: Self-pay | Admitting: Internal Medicine

## 2024-01-12 ENCOUNTER — Other Ambulatory Visit (HOSPITAL_COMMUNITY): Payer: Self-pay

## 2024-05-21 ENCOUNTER — Other Ambulatory Visit: Payer: Self-pay | Admitting: Physician Assistant

## 2024-05-23 ENCOUNTER — Other Ambulatory Visit: Payer: Self-pay | Admitting: Internal Medicine
